# Patient Record
Sex: Female | Born: 1940 | Race: White | Hispanic: No | Smoking: Former smoker
Health system: Southern US, Community
[De-identification: ages and names within clinical notes are randomized; demographics above are authoritative.]

## PROBLEM LIST (undated history)

## (undated) DIAGNOSIS — M2550 Pain in unspecified joint: Secondary | ICD-10-CM

## (undated) DIAGNOSIS — M5030 Other cervical disc degeneration, unspecified cervical region: Secondary | ICD-10-CM

## (undated) DIAGNOSIS — Z973 Presence of spectacles and contact lenses: Secondary | ICD-10-CM

## (undated) DIAGNOSIS — M791 Myalgia, unspecified site: Secondary | ICD-10-CM

## (undated) DIAGNOSIS — M479 Spondylosis, unspecified: Secondary | ICD-10-CM

## (undated) DIAGNOSIS — M51369 Other intervertebral disc degeneration, lumbar region without mention of lumbar back pain or lower extremity pain: Secondary | ICD-10-CM

## (undated) DIAGNOSIS — G43909 Migraine, unspecified, not intractable, without status migrainosus: Secondary | ICD-10-CM

## (undated) DIAGNOSIS — F1120 Opioid dependence, uncomplicated: Secondary | ICD-10-CM

## (undated) DIAGNOSIS — M1611 Unilateral primary osteoarthritis, right hip: Secondary | ICD-10-CM

## (undated) DIAGNOSIS — K08109 Complete loss of teeth, unspecified cause, unspecified class: Secondary | ICD-10-CM

## (undated) DIAGNOSIS — Z8719 Personal history of other diseases of the digestive system: Secondary | ICD-10-CM

## (undated) DIAGNOSIS — I8390 Asymptomatic varicose veins of unspecified lower extremity: Secondary | ICD-10-CM

## (undated) DIAGNOSIS — F329 Major depressive disorder, single episode, unspecified: Secondary | ICD-10-CM

## (undated) DIAGNOSIS — M75101 Unspecified rotator cuff tear or rupture of right shoulder, not specified as traumatic: Secondary | ICD-10-CM

## (undated) DIAGNOSIS — I10 Essential (primary) hypertension: Secondary | ICD-10-CM

## (undated) DIAGNOSIS — M1990 Unspecified osteoarthritis, unspecified site: Secondary | ICD-10-CM

## (undated) DIAGNOSIS — G5600 Carpal tunnel syndrome, unspecified upper limb: Secondary | ICD-10-CM

## (undated) DIAGNOSIS — K219 Gastro-esophageal reflux disease without esophagitis: Secondary | ICD-10-CM

## (undated) DIAGNOSIS — F419 Anxiety disorder, unspecified: Secondary | ICD-10-CM

## (undated) DIAGNOSIS — M7918 Myalgia, other site: Secondary | ICD-10-CM

## (undated) DIAGNOSIS — F32A Depression, unspecified: Secondary | ICD-10-CM

## (undated) DIAGNOSIS — Z972 Presence of dental prosthetic device (complete) (partial): Secondary | ICD-10-CM

## (undated) DIAGNOSIS — M5136 Other intervertebral disc degeneration, lumbar region: Secondary | ICD-10-CM

## (undated) HISTORY — DX: Unspecified rotator cuff tear or rupture of right shoulder, not specified as traumatic: M75.101

## (undated) HISTORY — DX: Spondylosis, unspecified: M47.9

## (undated) HISTORY — DX: Carpal tunnel syndrome, unspecified upper limb: G56.00

## (undated) HISTORY — DX: Other cervical disc degeneration, unspecified cervical region: M50.30

## (undated) HISTORY — DX: Asymptomatic varicose veins of unspecified lower extremity: I83.90

## (undated) HISTORY — DX: Pain in unspecified joint: M25.50

## (undated) HISTORY — DX: Other intervertebral disc degeneration, lumbar region: M51.36

## (undated) HISTORY — PX: SPINAL CORD STIMULATOR INSERTION: SHX5378

## (undated) HISTORY — DX: Myalgia, unspecified site: M79.10

## (undated) HISTORY — PX: APPENDECTOMY: SHX54

## (undated) HISTORY — PX: CARPAL TUNNEL RELEASE: SHX101

## (undated) HISTORY — DX: Unspecified osteoarthritis, unspecified site: M19.90

## (undated) HISTORY — PX: MULTIPLE TOOTH EXTRACTIONS: SHX2053

## (undated) HISTORY — PX: DILATION AND CURETTAGE OF UTERUS: SHX78

## (undated) HISTORY — DX: Anxiety disorder, unspecified: F41.9

## (undated) HISTORY — DX: Myalgia, other site: M79.18

## (undated) HISTORY — PX: OTHER SURGICAL HISTORY: SHX169

## (undated) HISTORY — DX: Other intervertebral disc degeneration, lumbar region without mention of lumbar back pain or lower extremity pain: M51.369

## (undated) HISTORY — DX: Major depressive disorder, single episode, unspecified: F32.9

## (undated) HISTORY — DX: Migraine, unspecified, not intractable, without status migrainosus: G43.909

## (undated) HISTORY — PX: TONSILLECTOMY: SUR1361

## (undated) HISTORY — PX: FRACTURE SURGERY: SHX138

## (undated) HISTORY — DX: Gastro-esophageal reflux disease without esophagitis: K21.9

## (undated) HISTORY — PX: COLONOSCOPY WITH ESOPHAGOGASTRODUODENOSCOPY (EGD): SHX5779

## (undated) HISTORY — DX: Depression, unspecified: F32.A

---

## 1986-09-06 HISTORY — PX: ABDOMINAL HYSTERECTOMY: SHX81

## 2012-10-25 ENCOUNTER — Ambulatory Visit (HOSPITAL_COMMUNITY): Payer: Self-pay | Admitting: Psychiatry

## 2012-11-15 ENCOUNTER — Ambulatory Visit (INDEPENDENT_AMBULATORY_CARE_PROVIDER_SITE_OTHER): Payer: TRICARE For Life (TFL) | Admitting: Psychiatry

## 2012-11-15 DIAGNOSIS — F332 Major depressive disorder, recurrent severe without psychotic features: Secondary | ICD-10-CM | POA: Insufficient documentation

## 2012-11-15 MED ORDER — CLONAZEPAM 0.5 MG PO TABS: 0.5000 mg | ORAL_TABLET | Freq: Two times a day (BID) | ORAL | Status: DC | PRN

## 2012-11-15 MED ORDER — DULOXETINE HCL 30 MG PO CPEP: 30.0000 mg | ORAL_CAPSULE | Freq: Every day | ORAL | Status: DC

## 2012-11-15 NOTE — Progress Notes (Signed)
Psychiatric Assessment Adult  Patient Identification:  Anne Terrell Date of Evaluation:  11/15/2012 Chief Complaint:I can not stand it History of Chief Complaint:  No chief complaint on file. this patient is a 72 year old white married female who me from [place] to [place] in August of this year with her husband. Unfortunately her husband has been quite ill. He's been diagnosed and treated for lymphoma but now is cancer free. He received intense chemotherapy and radiation therapy and apparently it is injured them in such a way where he is actually bed ridden. She reports that she weighs on him hand and foot. She's been doing this for a number of years.this marriage is this patient's fourth marriage. Unfortunately she does not have any children to help her. She says she feels persistently daily depressed for years mainly related to the extensive care that she is giving her husband. She's been crying more. The patient herself has some chronic back pain. Patient says that she is sleeping well and eating fairly well. She says she's not lost any weight but feels somewhat a reduction in her appetite. Her energy level she says is low. She is thinking and concentrating well, denies worthlessness at this time denies suicidal ideation. She is still able to enjoy reading TV and her 2 dogs. The patient denies being suspicious and is not somatic. She denies the use of alcohol ever. The patient denies psychotic symptomatology. Patient denies symptoms consistent with mania or of any specific anxiety disorder. The patient has been on Klonopin 0.5 mg twice a day and has been taking Zoloft 100 mg 2 a day for years. She also takes trazodone 50 mg at night. The patient has had 2 psychiatric hospitalizations the last one in 2010 when she was depressed. Noted is that she's not seen a psychiatrist as an outpatient and has never been in psychotherapy.  HPI Review of Systems Physical Exam  Depressive Symptoms: depressed  mood,  (Hypo) Manic Symptoms:   Elevated Mood:  No Irritable Mood:  No Grandiosity:  No Distractibility:  No Labiality of Mood:  No Delusions:  No Hallucinations:  No Impulsivity:  No Sexually Inappropriate Behavior:  No Financial Extravagance:  No Flight of Ideas:  No  Anxiety Symptoms: Excessive Worry:  No Panic Symptoms:  No Agoraphobia:  No Obsessive Compulsive: No  Symptoms: None, Specific Phobias:  No Social Anxiety:  No  Psychotic Symptoms:  Hallucinations: No None Delusions:  No Paranoia:  No   Ideas of Reference:  No  PTSD Symptoms: Ever had a traumatic exposure:  No Had a traumatic exposure in the last month:  No Re-experiencing: No None Hypervigilance:  No Hyperarousal: No None Avoidance: No None  Traumatic Brain Injury: No   Past Psychiatric History: Diagnosis: Major Depression  Hospitalizations: 2 x  Outpatient Care: none  Substance Abuse Care:   Self-Mutilation:   Suicidal Attempts: 1x  Violent Behaviors:    Past Medical History:  No past medical history on file. History of Loss of Consciousness:   Seizure History:   Cardiac History:   Allergies:  Allergies not on file Current Medications:  No current outpatient prescriptions on file.   No current facility-administered medications for this visit.    Previous Psychotropic Medications:  Medication Dose   Zoloft 100  2qam  Klonopine .5 BID                       Substance Abuse History in the last 12 months: Substance Age of 1st  Use Last Use Amount Specific Type   Medical Consequences of Substance Abuse:   Legal Consequences of Substance Abuse:   Family Consequences of Substance Abuse:   Blackouts:   DT's:   Withdrawal Symptoms:   None  Social History: Current Place of Residence: [place] [employer] of Birth:  Family Members:  Marital Status:  Married Children: none  Sons:   Daughters:  Relationships:  Education:   Educational Problems/Performance:  Religious  Beliefs/Practices:  History of Abuse: none Teacher, music History:  None. Legal History:  Hobbies/Interests:   Family History:  No family history on file.  Mental Status Examination/Evaluation: Objective:  Appearance: Casual  Eye Contact::  Fair  Speech:  NA  Volume:  Normal  Mood:  sad  Affect:  Congruent  Thought Process:  Coherent  Orientation:  Full (Time, Place, and Person)  Thought Content:  WDL  Suicidal Thoughts:  No  Homicidal Thoughts:  No  Judgement:  Good  Insight:  Good  Psychomotor Activity:  Normal  Akathisia:  No  Handed:  Right  AIMS (if indicated):    Assets:  Communication Skills    Laboratory/X-Ray Psychological Evaluation(s)        Assessment:  Axis I: Major Depression, Recurrent severe  AXIS I Major Depression, Recurrent severe  AXIS II Deferred  AXIS III No past medical history on file.   AXIS IV problems related to social environment  AXIS V 51-60 moderate symptoms   Treatment Plan/Recommendations:  Plan of Care: at this time the patient she'll taper off her Zoloft by taking 100 mg 4 days and then taking 50 mg for 4 dayswe shall go ahead and begin her on Cymbalta and titrate her up to 60 mg. For now the patient will continue taking Klonopin 0.5 mg twice a day and trazodone 50 mg at night. Today the patient received the telephone number for senior resources center [facility] to help her with some assistance in caring for her debilitated bed ridden husband. On her next visit in 7 weeks she she'll to discuss the possibility of psychotherapy  Laboratory:    Psychotherapy:   Medications:   Routine PRN Medications:    Consultations:   Safety Concerns:    Other:      Lucas Mallow, MD 3/12/20142:50 PM

## 2012-12-12 ENCOUNTER — Other Ambulatory Visit: Payer: Self-pay | Admitting: Family Medicine

## 2012-12-12 DIAGNOSIS — N644 Mastodynia: Secondary | ICD-10-CM

## 2012-12-12 DIAGNOSIS — N63 Unspecified lump in unspecified breast: Secondary | ICD-10-CM

## 2013-01-02 ENCOUNTER — Ambulatory Visit
Admission: RE | Admit: 2013-01-02 | Discharge: 2013-01-02 | Disposition: A | Discharge status: Home / Self Care | Payer: TRICARE For Life (TFL) | Source: Ambulatory Visit | Attending: Family Medicine | Admitting: Family Medicine

## 2013-01-02 DIAGNOSIS — N644 Mastodynia: Secondary | ICD-10-CM

## 2013-01-02 DIAGNOSIS — N63 Unspecified lump in unspecified breast: Secondary | ICD-10-CM

## 2013-01-05 ENCOUNTER — Ambulatory Visit (INDEPENDENT_AMBULATORY_CARE_PROVIDER_SITE_OTHER): Payer: Medicare Other | Admitting: Psychiatry

## 2013-01-05 DIAGNOSIS — F321 Major depressive disorder, single episode, moderate: Secondary | ICD-10-CM | POA: Insufficient documentation

## 2013-01-05 DIAGNOSIS — F332 Major depressive disorder, recurrent severe without psychotic features: Secondary | ICD-10-CM

## 2013-01-05 MED ORDER — VENLAFAXINE HCL ER 75 MG PO CP24: 150.0000 mg | ORAL_CAPSULE | Freq: Every day | ORAL | Status: DC

## 2013-01-05 MED ORDER — CLONAZEPAM 0.5 MG PO TABS: 0.5000 mg | ORAL_TABLET | Freq: Two times a day (BID) | ORAL | Status: DC | PRN

## 2013-01-05 NOTE — Progress Notes (Signed)
[facility] MD Progress Note  01/05/2013 11:56 AM Anne Terrell  MRN:  [id number] Subjective: got lost Diagnosis:  Axis I: Major Depression, Recurrent severe Today the patient came 10 minutes late. She comes alone to unfortunately the patient could not get her Cymbalta because it cost too much. Therefore she did not change the Zoloft. Overall her mood is no different. She's not worse but not better. She apparently is going to have a pain distributor putting her back and she needs a psychiatrist to talk to me. This I take one half in the future. Nonetheless because the Cymbalta was too expensive I will change her to taking Effexor instead. She'll continue taking Klonopin 0.5 mg twice a day. The patient claims that she sleeps at ADL's:  Intact  Sleep: Good  Appetite:  Good  Suicidal Ideation:  no Homicidal Ideation:  no AEB (as evidenced by):  Psychiatric Specialty Exam: ROS  There were no vitals taken for this visit.There is no height or weight on file to calculate BMI.  General Appearance: Casual  Eye Contact::  Good  Speech:  Clear and Coherent  Volume:  Normal  Mood:  Depressed  Affect:  Flat  Thought Process:  Linear  Orientation:  Full (Time, Place, and Person)  Thought Content:  WDL  Suicidal Thoughts:  No  Homicidal Thoughts:  No  Memory:  nl  Judgement:  Good  Insight:  Good  Psychomotor Activity:  Decreased  Concentration:  Good  Recall:  Good  Akathisia:  No  Handed:  Right  AIMS (if indicated):     Assets:  Desire for Improvement  Sleep:      Current Medications: Current Outpatient Prescriptions  Medication Sig Dispense Refill  . clonazePAM (KLONOPIN) 0.5 MG tablet Take 1 tablet (0.5 mg total) by mouth 2 (two) times daily as needed for anxiety. 1 BID  60 tablet  3  . DULoxetine (CYMBALTA) 30 MG capsule Take 1 capsule (30 mg total) by mouth daily. 1 qam for  1week the 2 qam  60 capsule  2  . venlafaxine XR (EFFEXOR-XR) 75 MG 24 hr capsule Take 2 capsules (150 mg  total) by mouth daily.  60 capsule  3   No current facility-administered medications for this visit.    Lab Results: No results found for this or any previous visit (from the past 48 hour(s)).  Physical Findings: AIMS:  , ,  ,  ,    CIWA:    COWS:     Treatment Plan Summary: At this time the patient will begin on Effexor 75 mg one in the morning for one week and then double. The patient will titrate off her Zoloft over the next week and a half. The patient will continue taking Klonopin 0.5 mg twice a day. Once again the patient was given the telephone number to senior resources are [facility] to get assistance both in therapy and possibly for any services that we delivered in her home. This patient to return to see me in 7 weeks.  Plan:  Medical Decision Making Problem Points:  Established problem, worsening (2) Data Points:  Review of new medications or change in dosage (2)  I certify that inpatient services furnished can reasonably be expected to improve the patient's condition.   Dyquan Minks IRVING 01/05/2013, 11:56 AM

## 2013-02-02 ENCOUNTER — Ambulatory Visit (HOSPITAL_COMMUNITY): Payer: TRICARE For Life (TFL) | Admitting: Psychiatry

## 2013-03-07 ENCOUNTER — Ambulatory Visit (HOSPITAL_COMMUNITY): Payer: TRICARE For Life (TFL) | Admitting: Psychiatry

## 2013-03-08 ENCOUNTER — Other Ambulatory Visit: Payer: Self-pay | Admitting: Pain Medicine

## 2013-03-08 DIAGNOSIS — M25511 Pain in right shoulder: Secondary | ICD-10-CM

## 2013-03-14 ENCOUNTER — Ambulatory Visit
Admission: RE | Admit: 2013-03-14 | Discharge: 2013-03-14 | Disposition: A | Discharge status: Home / Self Care | Payer: Medicare Other | Source: Ambulatory Visit | Attending: Pain Medicine | Admitting: Pain Medicine

## 2013-03-14 DIAGNOSIS — M25511 Pain in right shoulder: Secondary | ICD-10-CM

## 2013-04-11 ENCOUNTER — Ambulatory Visit (INDEPENDENT_AMBULATORY_CARE_PROVIDER_SITE_OTHER): Payer: Medicare Other | Admitting: Psychiatry

## 2013-04-11 DIAGNOSIS — F332 Major depressive disorder, recurrent severe without psychotic features: Secondary | ICD-10-CM

## 2013-04-11 DIAGNOSIS — F339 Major depressive disorder, recurrent, unspecified: Secondary | ICD-10-CM

## 2013-04-11 DIAGNOSIS — F411 Generalized anxiety disorder: Secondary | ICD-10-CM

## 2013-04-11 MED ORDER — VENLAFAXINE HCL ER 75 MG PO CP24: 150.0000 mg | ORAL_CAPSULE | Freq: Every day | ORAL | Status: DC

## 2013-04-11 MED ORDER — CLONAZEPAM 0.5 MG PO TABS: 0.5000 mg | ORAL_TABLET | Freq: Two times a day (BID) | ORAL | Status: DC | PRN

## 2013-04-11 NOTE — Progress Notes (Signed)
[facility] MD Progress Note  04/11/2013 3:27 PM Anne Terrell  MRN:  [id number] Subjective:  stronger Diagnosis:  Axis I: Generalized Anxiety Disorder and Major Depression, Recurrent severe Today the patient says she is much improved. She says the Effexor is very helpful. She said her mood is better and she is crying much much less. She feels stronger and more energetic. The patient denies any problem sleeping. She denies any role weight change but notes her appetite is less. She denies being suicidal. She denies problems thinking and concentrating. She denies a sense of worthlessness. She drinks no alcohol. She denies any suicidal ideation at this time. The patient is still able to read watch TV and enjoy things. His care of her husband who continues to slowly decline. The less the patient is doing much better. Today reviewed the pros and cons of her medications and she agreed to take them as prescribed. Today the patient denied any physical complaints. She denied any neurological symptoms at all. Patient appears optimistic and positive. Patient denies any symptoms of anxiety. ADL's:  Intact  Sleep: Good  Appetite:  Good  Suicidal Ideation:  no Homicidal Ideation:  no AEB (as evidenced by):  Psychiatric Specialty Exam: ROS  There were no vitals taken for this visit.There is no height or weight on file to calculate BMI.  General Appearance: Casual  Eye Contact::  Good  Speech:  Clear and Coherent  Volume:  Normal  Mood:  Euthymic  Affect:  Congruent  Thought Process:  Goal Directed  Orientation:  NA  Thought Content:  WDL  Suicidal Thoughts:  No  Homicidal Thoughts:  No  Memory:    Judgement:  Good  Insight:  Good  Psychomotor Activity:  Normal  Concentration:  Good  Recall:  Good  Akathisia:  No  Handed:  Right  AIMS (if indicated):     Assets:  Desire for Improvement  Sleep:      Current Medications: Current Outpatient Prescriptions  Medication Sig Dispense Refill  .  clonazePAM (KLONOPIN) 0.5 MG tablet Take 1 tablet (0.5 mg total) by mouth 2 (two) times daily as needed for anxiety. 1 BID  60 tablet  3  . venlafaxine XR (EFFEXOR-XR) 75 MG 24 hr capsule Take 2 capsules (150 mg total) by mouth daily.  60 capsule  5   No current facility-administered medications for this visit.    Lab Results: No results found for this or any previous visit (from the past 48 hour(s)).  Physical Findings: AIMS:  , ,  ,  ,    CIWA:    COWS:     Treatment Plan Summary: At this time the patient will continue taking Effexor 150 mg in the morning and Klonopin 0.5 mg twice a day. The patient has made no contact with senior resources in [place] but doesn't seem to need them. The patient seems much improved. This patient return to see me in 3 months.  Plan:  Medical Decision Making Problem Points:  Established problem, stable/improving (1) Data Points:  Review of medication regiment & side effects (2)  I certify that inpatient services furnished can reasonably be expected to improve the patient's condition.   Cleavon Goldman IRVING 04/11/2013, 3:27 PM

## 2013-05-30 ENCOUNTER — Telehealth (HOSPITAL_COMMUNITY): Payer: Self-pay | Admitting: *Deleted

## 2013-05-30 NOTE — Telephone Encounter (Signed)
Patient came to office. Crying. States husband has terminal liver disease with 3-5 months to live.States he told her this morning he will die this week. When she went to pharmacy this morning to pick up medicine for husband, she decided to come to Dr.Plovsky's office to see if there was anything that could be done to help her with her nerves. In Dr. Caprice Renshaw absence, pt record reviewed by Dr. Lolly Mustache. Dr. Lolly Mustache increased Klonopin 0.5 mg from two times daily to three times daily. Dr. Lolly Mustache asked that Dr.Plovsky be notified on his return in change in patient medication.

## 2013-07-18 ENCOUNTER — Encounter (INDEPENDENT_AMBULATORY_CARE_PROVIDER_SITE_OTHER): Payer: Self-pay

## 2013-07-18 ENCOUNTER — Ambulatory Visit (INDEPENDENT_AMBULATORY_CARE_PROVIDER_SITE_OTHER): Payer: Medicare Other | Admitting: Psychiatry

## 2013-07-18 DIAGNOSIS — F332 Major depressive disorder, recurrent severe without psychotic features: Secondary | ICD-10-CM

## 2013-07-18 MED ORDER — VENLAFAXINE HCL ER 75 MG PO CP24: 150.0000 mg | ORAL_CAPSULE | Freq: Every day | ORAL | Status: DC

## 2013-07-18 MED ORDER — CLONAZEPAM 0.5 MG PO TABS: 0.5000 mg | ORAL_TABLET | Freq: Two times a day (BID) | ORAL | Status: DC | PRN

## 2013-07-18 NOTE — Progress Notes (Signed)
[facility] MD Progress Note  07/18/2013 2:06 PM Anne Terrell  MRN:  [id number] Subjective: He died Today the patient started off by sharing that her husband got a second malignancy and died 3 weeks ago. The patient went through much of the session. The patient says that she's doing better. The first week or 2 was very difficult. Fortunately she has her mother-in-law and brother-in-law in the city to be with her. The funeral was a Designer, television/film set, Product/process development scientist. She doesn't knowledge that is better that her husband is no longer suffering and that in some ways even she feels relieved. She denies persistent depression at this time. She is actually sleeping better now. Her husband would typically wait her out throughout the night. The patient is eating fair. She has good energy. She can still concentrate. She watches TV and she read some. She denies the use of alcohol. The patient is actually looking forward to having a pain stimulator put in her back. She does suffer from mild to moderate chronic back pain. The patient is trying to stay active. Generally she is a good attitude. Today we discussed the process of grief and anxiety of waves of grief. The patient seems connected and intact. She's not suicidal. Today reviewed the pros and cons of her medication and she agreed to continue taking them. She takes them just as prescribed. Today patient denies any physical complaints. She denied any neurological symptoms. Diagnosis:   DSM5: Schizophrenia Disorders:   Obsessive-Compulsive Disorders:   Trauma-Stressor Disorders:   Substance/Addictive Disorders:   Depressive Disorders:  Major Depressive Disorder - Mild (296.21)  Axis I: Major Depression, Recurrent severe  ADL's:  Intact  Sleep: Good  Appetite:  Good  Suicidal Ideation:  no Homicidal Ideation:  none AEB (as evidenced by):  Psychiatric Specialty Exam: ROS  There were no vitals taken for this visit.There is no height or weight on file to  calculate BMI.  General Appearance:   Eye Contact::  Good  Speech:  Clear and Coherent  Volume:  Normal  Mood:  Depressed  Affect:  Appropriate  Thought Process:  Coherent  Orientation:  Full (Time, Place, and Person)  Thought Content:  WDL  Suicidal Thoughts:  No  Homicidal Thoughts:  No  Memory:  nl  Judgement:  Good  Insight:  Good  Psychomotor Activity:  Normal  Concentration:  Good  Recall:  Good  Akathisia:  No  Handed:  Right  AIMS (if indicated):     Assets:  Communication Skills  Sleep:      Current Medications: Current Outpatient Prescriptions  Medication Sig Dispense Refill  . clonazePAM (KLONOPIN) 0.5 MG tablet Take 1 tablet (0.5 mg total) by mouth 2 (two) times daily as needed for anxiety. 1 BID  60 tablet  3  . venlafaxine XR (EFFEXOR-XR) 75 MG 24 hr capsule Take 2 capsules (150 mg total) by mouth daily.  60 capsule  5   No current facility-administered medications for this visit.    Lab Results: No results found for this or any previous visit (from the past 48 hour(s)).  Physical Findings: AIMS:  , ,  ,  ,    CIWA:    COWS:     Treatment Plan Summary:   Plan: At this time the patient will continue taking Klonopin 0.5 mg twice a day. She'll continue taking Effexor 150 mg 2 in the morning. This patient seems stable to me. She's agreed to return to see me in 3 months.  Medical  Decision Making Problem Points:  Established problem, stable/improving (1) Data Points:  Review of medication regiment & side effects (2)  I certify that inpatient services furnished can reasonably be expected to improve the patient's condition.   Anne Terrell IRVING 07/18/2013, 2:06 PM

## 2013-10-17 ENCOUNTER — Ambulatory Visit (INDEPENDENT_AMBULATORY_CARE_PROVIDER_SITE_OTHER): Payer: Medicare Other | Admitting: Psychiatry

## 2013-10-17 VITALS — BP 137/68 | HR 92 | Ht 66.0 in | Wt 170.4 lb

## 2013-10-17 DIAGNOSIS — F332 Major depressive disorder, recurrent severe without psychotic features: Secondary | ICD-10-CM

## 2013-10-17 MED ORDER — CLONAZEPAM 0.5 MG PO TABS: 0.5000 mg | ORAL_TABLET | Freq: Two times a day (BID) | ORAL | Status: DC | PRN

## 2013-10-17 MED ORDER — VENLAFAXINE HCL ER 75 MG PO CP24: 150.0000 mg | ORAL_CAPSULE | Freq: Every day | ORAL | Status: DC

## 2013-10-17 NOTE — Progress Notes (Signed)
[facility] MD Progress Note  10/17/2013 1:43 PM Anne Terrell  MRN:  [id number] Subjective:  Feeling well Today the patient is seen one time. The patient continues to do very well. She denies depression. Her mood is clearly improved. She's gone through a significant grieving process and feels better. She is sleeping and eating well. She has good energy. The patient is to be moving into her mother loss home. It is noted that the patient's husband died 11/17/2024and then 3 months later his mother died. The patient is inheriting one third of his mother's in [employer]. The patient is very active. She has 2 dogs that she walks all the time. She is reading well. She reads self-help books about grieving. The patient is active in her church. She just recently taken over by bowel and tearing at the church is [employer]. The patient is thinking and concentrating well. She is a good sense of worth. It is noted the patient is on disability for her back. Over the time I've known her we've made efforts to get approval for her to have a device for pain. In December 15 she had a device placed and is very helpful to reduce her back pain. The patient takes her medicines as prescribed. She is very creative. She makes jewelry and sews. She spiritually active. This patient denies being suicidal. She's taking her medications as prescribed. Interestingly hospice has always been sending a visiting therapist to her home. Her next session is tomorrow. Patient is recovering very well from the death of her husband. She had some issues getting her driving license but that has been handled.  Diagnosis:   DSM5: Schizophrenia Disorders:   Obsessive-Compulsive Disorders:   Trauma-Stressor Disorders:   Substance/Addictive Disorders:   Depressive Disorders:  Major Depressive Disorder - Mild (296.21) Total Time spent with patient: 45 minutes  Axis I: Major Depression, Recurrent severe  ADL's:  Intact  Sleep: Good  Appetite:   Good  Suicidal Ideation:  no Homicidal Ideation:  none AEB (as evidenced by):  Psychiatric Specialty Exam: Physical Exam  ROS  Blood pressure 137/68, pulse 92, height 5\' 6"  (1.676 m), weight 170 lb 6.4 oz (77.293 kg).Body mass index is 27.52 kg/(m^2).  General Appearance: Fairly Groomed  Patent attorney::  Good  Speech:  Clear and Coherent  Volume:  Normal  Mood:  Euthymic  Affect:  Appropriate  Thought Process:  Coherent  Orientation:  Full (Time, Place, and Person)  Thought Content:  WDL  Suicidal Thoughts:  No  Homicidal Thoughts:  No  Memory:  NA  Judgement:  Good  Insight:  Good  Psychomotor Activity:  Normal  Concentration:  Good  Recall:  Good  Fund of Knowledge:Good  Language: Good  Akathisia:  No  Handed:  Right  AIMS (if indicated):     Assets:  Communication Skills  Sleep:      Musculoskeletal: Strength & Muscle Tone:  Gait & Station:  Patient leans:   Current Medications: Current Outpatient Prescriptions  Medication Sig Dispense Refill  . clonazePAM (KLONOPIN) 0.5 MG tablet Take 1 tablet (0.5 mg total) by mouth 2 (two) times daily as needed for anxiety. 1 BID  60 tablet  5  . venlafaxine XR (EFFEXOR-XR) 75 MG 24 hr capsule Take 2 capsules (150 mg total) by mouth daily.  60 capsule  5   No current facility-administered medications for this visit.    Lab Results: No results found for this or any previous visit (from the past 48  hour(s)).  Physical Findings: AIMS:  , ,  ,  ,    CIWA:    COWS:     Treatment Plan Summary: At this time it was clarified that the patient is taking Effexor 75 mg 2 in the morning. The patient is also taking Klonopin 0.5 twice a day. The patient's mood is dramatically improved. She's not anxious. She is not oversedated. The patient is very active in her treatment plan. Noted is the patient does have a therapist who actually comes from hospice and this is her at her home. This patient to return to see me in 4  months.  Plan:  Medical Decision Making Problem Points:  Established problem, stable/improving (1) Data Points:  Review of medication regiment & side effects (2)  I certify that inpatient services furnished can reasonably be expected to improve the patient's condition.   Anne Terrell 10/17/2013, 1:43 PM

## 2014-02-20 ENCOUNTER — Ambulatory Visit (HOSPITAL_COMMUNITY): Payer: Medicare Other | Admitting: Psychiatry

## 2014-03-04 ENCOUNTER — Other Ambulatory Visit: Payer: Self-pay | Admitting: Family Medicine

## 2014-03-04 DIAGNOSIS — Z1231 Encounter for screening mammogram for malignant neoplasm of breast: Secondary | ICD-10-CM

## 2014-03-21 ENCOUNTER — Other Ambulatory Visit: Payer: Self-pay | Admitting: *Deleted

## 2014-03-21 DIAGNOSIS — I83893 Varicose veins of bilateral lower extremities with other complications: Secondary | ICD-10-CM

## 2014-03-26 ENCOUNTER — Ambulatory Visit: Payer: Medicare Other

## 2014-04-05 ENCOUNTER — Encounter (INDEPENDENT_AMBULATORY_CARE_PROVIDER_SITE_OTHER): Payer: Self-pay

## 2014-04-05 ENCOUNTER — Ambulatory Visit
Admission: RE | Admit: 2014-04-05 | Discharge: 2014-04-05 | Disposition: A | Discharge status: Home / Self Care | Payer: Medicare Other | Source: Ambulatory Visit | Attending: Family Medicine | Admitting: Family Medicine

## 2014-04-05 DIAGNOSIS — Z1231 Encounter for screening mammogram for malignant neoplasm of breast: Secondary | ICD-10-CM

## 2014-04-17 ENCOUNTER — Other Ambulatory Visit (HOSPITAL_COMMUNITY): Payer: Self-pay | Admitting: Psychiatry

## 2014-04-29 ENCOUNTER — Other Ambulatory Visit (HOSPITAL_COMMUNITY): Payer: Self-pay | Admitting: Psychiatry

## 2014-05-02 ENCOUNTER — Encounter: Payer: Self-pay | Admitting: Vascular Surgery

## 2014-05-02 ENCOUNTER — Other Ambulatory Visit (HOSPITAL_COMMUNITY): Payer: Self-pay | Admitting: *Deleted

## 2014-05-02 MED ORDER — CLONAZEPAM 0.5 MG PO TABS: 0.5000 mg | ORAL_TABLET | Freq: Two times a day (BID) | ORAL | Status: DC | PRN

## 2014-05-02 NOTE — Telephone Encounter (Signed)
Per MD order: May reorder 30 day supply of clonazepam with request for pharmacy to flag RX_Must make appt to receive further refills

## 2014-05-03 ENCOUNTER — Encounter: Payer: Self-pay | Admitting: Vascular Surgery

## 2014-05-03 ENCOUNTER — Ambulatory Visit (HOSPITAL_COMMUNITY)
Admission: RE | Admit: 2014-05-03 | Discharge: 2014-05-03 | Disposition: A | Discharge status: Home / Self Care | Payer: Medicare Other | Source: Ambulatory Visit | Attending: Vascular Surgery | Admitting: Vascular Surgery

## 2014-05-03 ENCOUNTER — Ambulatory Visit (INDEPENDENT_AMBULATORY_CARE_PROVIDER_SITE_OTHER): Payer: Medicare Other | Admitting: Vascular Surgery

## 2014-05-03 VITALS — BP 108/68 | HR 77 | Resp 16 | Ht 63.0 in | Wt 158.0 lb

## 2014-05-03 DIAGNOSIS — I872 Venous insufficiency (chronic) (peripheral): Secondary | ICD-10-CM | POA: Insufficient documentation

## 2014-05-03 DIAGNOSIS — I83893 Varicose veins of bilateral lower extremities with other complications: Secondary | ICD-10-CM | POA: Diagnosis present
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
## 2014-06-14 ENCOUNTER — Ambulatory Visit (HOSPITAL_COMMUNITY): Payer: Medicare Other | Admitting: Psychiatry

## 2014-06-14 ENCOUNTER — Ambulatory Visit (INDEPENDENT_AMBULATORY_CARE_PROVIDER_SITE_OTHER): Payer: Medicare Other | Admitting: Psychiatry

## 2014-06-14 VITALS — BP 125/70 | HR 98 | Ht 63.0 in | Wt 154.6 lb

## 2014-06-14 DIAGNOSIS — F332 Major depressive disorder, recurrent severe without psychotic features: Secondary | ICD-10-CM

## 2014-06-14 MED ORDER — VENLAFAXINE HCL ER 75 MG PO CP24: ORAL_CAPSULE | ORAL | Status: DC

## 2014-06-14 NOTE — Progress Notes (Signed)
[facility]  06/14/2014 12:09 PM Anne Terrell  MRN:  [id number] Subjective: Crying and sad Today the patient now just that she's having persistent worsening depression over the last few months. This is distinctly different than her previous visit. The patient cries on a daily basis. She is sleeping okay but her appetite is dramatically reduced. This is directly related to the deaths and the ambulance she feels about her husband. The patient is in a stable living environment. She does not drink. Her energy level is very poor. She can think and concentrate fairly well. The patient acknowledges feeling very worthless. At this time she acknowledges fleeting insignificant suicidal thoughts. It is noted that she made a very distant suicide attempt many many years ago. The patient would not act now and has no intent to commit suicide as religion stops her. The patient is wanting to get well and now is willing to enter into psychotherapy with hospice. Diagnosis:  DSM5: Schizophrenia Disorders:   Obsessive-Compulsive Disorders:   Trauma-Stressor Disorders:   Substance/Addictive Disorders:   Depressive Disorders:  Major Depressive Disorder - Moderate (296.22) Total Time spent with patien 15 min  Axis I: Major Depression, Recurrent severe  ADL's:  Intact  Sleep: Good  Appetite:  Poor  Suicidal Ideation:  no Homicidal Ideation:  none AEB (as evidenced by):  Psychiatric Specialty Exam: Physical Exam  ROS  Blood pressure 125/70, pulse 98, height 5\' 3"  (1.6 m), weight 154 lb 9.6 oz (70.126 kg).Body mass index is 27.39 kg/(m^2).  General Appearance: Casual  Eye Contact::  Good  Speech:  Clear and Coherent  Volume:  Normal  Mood:  Euthymic  Affect:  Appropriate  Thought Process:  Coherent  Orientation:  NA  Thought Content:  WDL  Suicidal Thoughts:  No  Homicidal Thoughts:  No  Memory:  NA  Judgement:  Good  Insight:  NA and Good  Psychomotor Activity:  Decreased   Concentration:  Good  Recall:  Fair  Fund of Knowledge:Good  Language: Good  Akathisia:  No  Handed:  Right  AIMS (if indicated):     Assets:  Desire for Improvement  Sleep:      Musculoskeletal: Strength & Muscle Tone:  Gait & Station:  Patient leans:   Current Medications: Current Outpatient Prescriptions  Medication Sig Dispense Refill  . clonazePAM (KLONOPIN) 0.5 MG tablet Take 1 tablet (0.5 mg total) by mouth 2 (two) times daily as needed for anxiety. 1 BID  60 tablet  1  . esomeprazole (NEXIUM) 40 MG capsule Take 40 mg by mouth.      . estradiol (ESTRACE) 1 MG tablet Take 1 mg by mouth.      . fentaNYL (DURAGESIC - DOSED MCG/HR) 100 MCG/HR Place 1 patch onto the skin.      . fentaNYL (FENTORA) 100 MCG buccal tablet Place 100 mcg inside cheek every 4 (four) hours as needed for breakthrough pain or severe pain.      . Oxycodone HCl 10 MG TABS Take 10 mg by mouth 3 (three) times daily.      Marland Kitchen. oxyCODONE-acetaminophen (PERCOCET) 10-325 MG per tablet Take 1 tablet by mouth.      Marland Kitchen. rOPINIRole (REQUIP) 0.5 MG tablet Take 0.5 mg by mouth.      . SUMAtriptan (IMITREX) 50 MG tablet Take 50 mg by mouth.      . traZODone (DESYREL) 50 MG tablet Take 50 mg by mouth at bedtime.      Marland Kitchen. venlafaxine XR (EFFEXOR-XR)  75 MG 24 hr capsule TAKE 3 CAPSULES BY MOUTH DAILY  90 capsule  5  . venlafaxine XR (EFFEXOR-XR) 75 MG 24 hr capsule TAKE 2 CAPSULES BY MOUTH DAILY  60 capsule  5  . venlafaxine XR (EFFEXOR-XR) 75 MG 24 hr capsule TAKE 3 CAPSULES BY MOUTH DAILY  90 capsule  5   No current facility-administered medications for this visit.    Lab Results: No results found for this or any previous visit (from the past 48 hour(s)).  Physical Findings: AIMS:  , ,  ,  ,    CIWA:    COWS:     Treatment Plan Summary: At this time the patient will be given the number to hospice in [place]. We'll also go ahead and increase her Effexor from 150 mg to 225 mg. This patient will call as if there  problems. I do not think this patient is dangerous to herself or others. She is not acutely suicidal. She's fleeting thoughts but no plan or intent to act. This patient was told to call if there any changes they were always available for her. Otherwise she'll be seen back in the office in one to 2 months.   Plan:  Medical Decision Making Problem Points:  Established problem, worsening (2) Data Points:  Review of new medications or change in dosage (2)  I certify that inpatient services furnished can reasonably be expected to improve the patient's condition.   Anne Terrell 06/14/2014, 12:09 PM

## 2014-06-26 ENCOUNTER — Other Ambulatory Visit (HOSPITAL_COMMUNITY): Payer: Self-pay | Admitting: *Deleted

## 2014-06-26 DIAGNOSIS — F332 Major depressive disorder, recurrent severe without psychotic features: Secondary | ICD-10-CM

## 2014-06-26 MED ORDER — VENLAFAXINE HCL ER 75 MG PO CP24: ORAL_CAPSULE | ORAL | Status: DC

## 2014-06-26 NOTE — Telephone Encounter (Addendum)
Came to office to request that RX for Venlafaxine HCL ER 75mg  be sent [facility].Brought printed RX from 06/14/14 with her.  Advised her RX can be sent electronically to [facility]. RX sent at this time. Notified [facility] that RX will be sent to [facility]. They are to discontinue RX sent to them on 06/14/14

## 2014-07-19 ENCOUNTER — Other Ambulatory Visit (HOSPITAL_COMMUNITY): Payer: Self-pay | Admitting: Psychiatry

## 2014-07-19 NOTE — Telephone Encounter (Signed)
Chart reviewed. Refill appropriate. 

## 2014-07-30 ENCOUNTER — Ambulatory Visit: Payer: Medicare Other | Admitting: Vascular Surgery

## 2014-08-06 ENCOUNTER — Ambulatory Visit: Payer: Medicare Other | Admitting: Vascular Surgery

## 2014-08-07 ENCOUNTER — Encounter: Payer: Self-pay | Admitting: Vascular Surgery

## 2014-08-07 ENCOUNTER — Ambulatory Visit (INDEPENDENT_AMBULATORY_CARE_PROVIDER_SITE_OTHER): Payer: Medicare Other | Admitting: Psychiatry

## 2014-08-07 VITALS — BP 124/66 | HR 82 | Ht 63.0 in | Wt 152.6 lb

## 2014-08-07 DIAGNOSIS — F331 Major depressive disorder, recurrent, moderate: Secondary | ICD-10-CM

## 2014-08-07 DIAGNOSIS — F332 Major depressive disorder, recurrent severe without psychotic features: Secondary | ICD-10-CM

## 2014-08-07 MED ORDER — CLONAZEPAM 0.5 MG PO TABS: ORAL_TABLET | ORAL | Status: DC

## 2014-08-07 MED ORDER — VENLAFAXINE HCL ER 75 MG PO CP24: ORAL_CAPSULE | ORAL | Status: DC

## 2014-08-07 MED ORDER — TRAZODONE HCL 50 MG PO TABS: 50.0000 mg | ORAL_TABLET | Freq: Every day | ORAL | Status: DC

## 2014-08-07 NOTE — Progress Notes (Signed)
[facility]  08/07/2014 4:39 PM Anne Terrell  MRN:  [id number] Subjective:  Better At this time the patient is much more active and she denies being depressed. She is meeting more people. She's going to church regularly. She joined the choir and she goes to [place] [place]. She's more engaged with the world around her. She never actually went to hospice therapy. The patient says she is recovering quite well. She has Christmas plans which is all set up. The patient says she sleeping and eating well. She's got a reasonably good amount of energy. She denies problems thinking or concentrating. She has a good sense of worth. The patient denies any suicidal thoughts at all. She is happily engaged and seems to be recovering fairly well. She believes the increase of Effexor is been very helpful and I suspect it has been. The patient takes a little trazodone to help her sleep. I think the patient continues to grieve but is doing better. This will be the first Christmas without her husband. Diagnosis:   DSM5: Schizophrenia Disorders:   Obsessive-Compulsive Disorders:   Trauma-Stressor Disorders:   Substance/Addictive Disorders:   Depressive Disorders:  Major Depressive Disorder - Mild (296.21) Total Time spent with patient: 30 minutes  Axis I: Major Depression, Recurrent severe  ADL's:  Intact  Sleep: Good  Appetite:  Good  Suicidal Ideation:  no Homicidal Ideation:  none AEB (as evidenced by):  Psychiatric Specialty Exam: Physical Exam  ROS  Blood pressure 124/66, pulse 82, height 5\' 3"  (1.6 m), weight 152 lb 9.6 oz (69.219 kg).Body mass index is 27.04 kg/(m^2).  General Appearance: Casual  Eye Contact::  Good  Speech:  Clear and Coherent  Volume:  Normal  Mood:nl  Affect:  Congruent  Thought Process:  Goal Directed  Orientation:  Full (Time, Place, and Person)  Thought Content:  WDL  Suicidal Thoughts:  No  Homicidal Thoughts:  No  Memory:  Negative  Judgement:  Negative   Insight:  Negative  Psychomotor Activity:  NA  Concentration:  NA  Recall:  NA  Fund of Knowledge:Good  Language: Good  Akathisia:  No  Handed:  Right  AIMS (if indicated):     Assets:  Communication Skills  Sleep:      Musculoskeletal: Strength & Muscle Tone:  Gait & Station:  Patient leans:   Current Medications: Current Outpatient Prescriptions  Medication Sig Dispense Refill  . clonazePAM (KLONOPIN) 0.5 MG tablet take 1 tablet by mouth twice a day if needed 60 tablet 0  . esomeprazole (NEXIUM) 40 MG capsule Take 40 mg by mouth.    . estradiol (ESTRACE) 1 MG tablet Take 1 mg by mouth.    . fentaNYL (DURAGESIC - DOSED MCG/HR) 100 MCG/HR Place 1 patch onto the skin.    . fentaNYL (FENTORA) 100 MCG buccal tablet Place 100 mcg inside cheek every 4 (four) hours as needed for breakthrough pain or severe pain.    . Oxycodone HCl 10 MG TABS Take 10 mg by mouth 3 (three) times daily.    Marland Kitchen. oxyCODONE-acetaminophen (PERCOCET) 10-325 MG per tablet Take 1 tablet by mouth.    Marland Kitchen. rOPINIRole (REQUIP) 0.5 MG tablet Take 0.5 mg by mouth.    . SUMAtriptan (IMITREX) 50 MG tablet Take 50 mg by mouth.    . traZODone (DESYREL) 50 MG tablet Take 1 tablet (50 mg total) by mouth at bedtime. 30 tablet 5  . venlafaxine XR (EFFEXOR-XR) 75 MG 24 hr capsule TAKE 3 CAPSULES BY  MOUTH DAILY 90 capsule 5   No current facility-administered medications for this visit.    Lab Results: No results found for this or any previous visit (from the past 48 hour(s)).  Physical Findings: AIMS:  , ,  ,  ,    CIWA:    COWS:     Treatment Plan Summary: At this time the patient will continue taking Effexor 225 mg. The patient remain active and return to see me in approximately 2-3 months. I believe the patient is doing great.  Plan:  Medical Decision Making Problem Points:  Established problem, stable/improving (1) Data Points:  Review of medication regiment & side effects (2)  I certify that inpatient services  furnished can reasonably be expected to improve the patient's condition.   Lucas MallowLOVSKY, Ankush Gintz IRVING 08/07/2014, 4:39 PM

## 2014-08-08 ENCOUNTER — Ambulatory Visit (INDEPENDENT_AMBULATORY_CARE_PROVIDER_SITE_OTHER): Payer: Medicare Other | Admitting: Vascular Surgery

## 2014-08-08 ENCOUNTER — Encounter: Payer: Self-pay | Admitting: Vascular Surgery

## 2014-08-08 VITALS — BP 122/82 | HR 91 | Resp 16 | Ht 63.0 in | Wt 150.0 lb

## 2014-08-08 DIAGNOSIS — I872 Venous insufficiency (chronic) (peripheral): Secondary | ICD-10-CM

## 2014-08-08 NOTE — Progress Notes (Signed)
Problems with Activities of Daily Living Secondary to Leg Pain  1. Mrs. Polsky states all activities that require prolonged standing (cooking, cleaning, shopping) are difficult for her due to leg pain.    2. Mrs. Stephen states that she is unable to exercise due to leg pain.     Failure of  Conservative Therapy:  1. Worn 20-30 mm Hg thigh high compression hose >3 months with no relief of symptoms.  2. Frequently elevates legs-no relief of symptoms  3. Taken Ibuprofen 600 Mg TID with no relief of symptoms.  Here today for continued discussion of her venous pathology  Again discussed the issue of her left leg venous hypertension. I did reimage her saphenous veins in her right and left leg. These are normal on the right leg she does have dilatation of her saphenous vein in the left leg. She did have documented reflux with a formal duplex. She continues to complain of pains that are not consistent with venous hypertension. She does have some varicosities the level of her ankle but reports no pain from this. I discussed this at length with patient. I did explain that she has venous hypertension related to her valvular incompetence but this is not causing urinary any symptoms. Explained that this may be progressive but is not dangerous and would not recommend any treatment unless she develops symptoms related to this. She is comfortable with this discussion will see us again on as-needed basis

## 2014-11-06 ENCOUNTER — Ambulatory Visit (INDEPENDENT_AMBULATORY_CARE_PROVIDER_SITE_OTHER): Payer: Medicare Other | Admitting: Psychiatry

## 2014-11-06 ENCOUNTER — Encounter (HOSPITAL_COMMUNITY): Payer: Self-pay | Admitting: Psychiatry

## 2014-11-06 VITALS — BP 127/69 | HR 88 | Ht 63.0 in | Wt 148.0 lb

## 2014-11-06 DIAGNOSIS — F331 Major depressive disorder, recurrent, moderate: Secondary | ICD-10-CM | POA: Diagnosis not present

## 2014-11-06 DIAGNOSIS — F332 Major depressive disorder, recurrent severe without psychotic features: Secondary | ICD-10-CM

## 2014-11-06 MED ORDER — VENLAFAXINE HCL ER 75 MG PO CP24
ORAL_CAPSULE | ORAL | Status: DC
Start: 2014-11-06 — End: 2014-12-18

## 2014-11-06 MED ORDER — CLONAZEPAM 0.5 MG PO TABS: ORAL_TABLET | ORAL | Status: DC

## 2014-11-06 MED ORDER — TRAZODONE HCL 50 MG PO TABS: 50.0000 mg | ORAL_TABLET | Freq: Every day | ORAL | Status: DC

## 2014-11-06 NOTE — Progress Notes (Addendum)
[facility] MD Progress Note  11/06/2014 3:05 PM Anne Terrell  MRN:  [id number] Subjective: Unfortunately a decline The patient acknowledges that for the last month or 2 she's felt much more depressed. She is isolating herself. Unfortunately she found out just a couple days ago that one of her dogs is going blind and has diabetes. These are the 2 dogs that she's had for years and that she's gotten close with since the death of her husband Kathlene November. It turns out the patient was not proactive that she said she would be on her last visit. She does go to a Bible study with some women weekly but she did not joined the choir and never went to hospice. She says she spends much for time in bed. The patient is in bed she reads but she feels depressed. She's lost weight including 5 pounds but she says is likely related to the fact that she needs new dentures. The patient is actually sleeping a normal amount of time. She can concentrate without a problem. Her self-esteem is clear reduced. The patient denies being suicidal at this time. She does not drink any alcohol. She's not suspicious nor she somatic. She demonstrates no psychotic symptoms. The patient is isolated with few friends. She has no relatives. She is not close with her neighbors. Today we discussed the importance of taking action and doing more than just staying home in bed. For reasons that are not clear this patient reduced her Effexor from 225 down to 150 mg. She does take trazodone 50. The patient says her appetite is reduced. Principal Problem: Major depression, recurrent episode mild Diagnosis: Major depression, recurrent episode mild Major depression, recurrent episode Patient Active Problem List   Diagnosis Date Noted  . Varicose veins of lower extremities with other complications [I83.899] 05/03/2014  . Major depressive disorder, recurrent episode, unspecified [F33.9] 04/11/2013  . Major depressive disorder, single episode, moderate [F32.1] 01/05/2013   . 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 Varicose veins   . Depression     Past Surgical History  Procedure Laterality Date  . Abdominal hysterectomy  1988  . Appendectomy     Family History:  Family History  Problem Relation Age of Onset  . Diabetes Mother   . Varicose Veins Mother    Social History:  History  Alcohol Use No     History  Drug Use No    History   Social History  . Marital Status: Married    Spouse Name: N/A  . Number of Children: N/A  . Years of Education: N/A   Social History Main Topics  . Smoking status: Former Smoker    Quit date: 05/03/2004  . Smokeless tobacco: Never Used  . Alcohol Use: No  . Drug Use: No  . Sexual Activity: Not on file   Other Topics Concern  . None   Social History Narrative   Additional History:    Sleep: Good  Appetite:  Fair   Assessment:   Musculoskeletal: Strength & Muscle Tone:  Gait & Station:  Patient leans:    Psychiatric Specialty Exam: Physical Exam  ROS  Blood pressure 127/69, pulse 88, height  (1.6 m), weight 148 lb (67.132 kg).Body mass index is 26.22 kg/(m^2).  General Appearance: Casual  Eye Contact::  Good  Speech:  Normal Rate  Volume:  Normal  Mood:  Depressed  Affect:  Appropriate  Thought Process:  Coherent  Orientation:  Full (Time, Place, and Person)  Thought Content:  WDL  Suicidal Thoughts:  No  Homicidal Thoughts:  No  Memory:  Negative  Judgement:  Good  Insight:  Good  Psychomotor Activity:  Decreased  Concentration:  Good  Recall:  Good  Fund of Knowledge:Good  Language: Good  Akathisia:  No  Handed:  Right  AIMS (if indicated):     Assets:  Desire for Improvement  ADL's:  Intact  Cognition: WNL  Sleep:        Current Medications: Current Outpatient Prescriptions   Medication Sig Dispense Refill  . clonazePAM (KLONOPIN) 0.5 MG tablet take 1 tablet by mouth twice a day if needed 60 tablet 3  . esomeprazole (NEXIUM) 40 MG capsule Take 40 mg by mouth.    . estradiol (ESTRACE) 1 MG tablet Take 1 mg by mouth.    . fentaNYL (DURAGESIC - DOSED MCG/HR) 100 MCG/HR Place 1 patch onto the skin.    . fentaNYL (FENTORA) 100 MCG buccal tablet Place 100 mcg inside cheek every 4 (four) hours as needed for breakthrough pain or severe pain.    . Oxycodone HCl 10 MG TABS Take 10 mg by mouth 3 (three) times daily.    Marland Kitchen. oxyCODONE-acetaminophen (PERCOCET) 10-325 MG per tablet Take 1 tablet by mouth.    Marland Kitchen. rOPINIRole (REQUIP) 0.5 MG tablet Take 0.5 mg by mouth.    . SUMAtriptan (IMITREX) 50 MG tablet Take 50 mg by mouth.    . traZODone (DESYREL) 50 MG tablet Take 1 tablet (50 mg total) by mouth at bedtime. 30 tablet 5  . venlafaxine XR (EFFEXOR-XR) 75 MG 24 hr capsule TAKE 3 CAPSULES BY MOUTH DAILY 90 capsule 5   No current facility-administered medications for this visit.    Lab Results: No results found for this or any previous visit (from the past 48 hour(s)).  Physical Findings: AIMS:  , ,  ,  ,    CIWA:    COWS:     Treatment Plan Summary: Medication management At this time the patient is agreed to return back to the full dose of Effexor 225 mg. She'll continue taking trazodone 50 mg. The patient agreed that she will step out and start doing some things. [facility] and agrees to call them. They apparently have an opportunity to some beading and jewelry activities. The patient is interested in that. The patient continue going to Bible study and reconsider joining the choir. This patient was seen again in 5 weeks. The patient was told to call us if any changes take place. Her next visit I would consider increasing her Effexor to the highest dose of 300 mg if she is not moving. This patient is not acutely suicidal.  Medical  Decision Making:  New problem, with additional work up planned     Arlette Schaad IRVING 11/06/2014, 3:05 PM

## 2014-12-18 ENCOUNTER — Ambulatory Visit (INDEPENDENT_AMBULATORY_CARE_PROVIDER_SITE_OTHER): Payer: Medicare Other | Admitting: Psychiatry

## 2014-12-18 VITALS — BP 108/63 | HR 83 | Ht 63.0 in | Wt 147.8 lb

## 2014-12-18 DIAGNOSIS — F329 Major depressive disorder, single episode, unspecified: Secondary | ICD-10-CM | POA: Diagnosis not present

## 2014-12-18 DIAGNOSIS — F331 Major depressive disorder, recurrent, moderate: Secondary | ICD-10-CM

## 2014-12-18 DIAGNOSIS — F332 Major depressive disorder, recurrent severe without psychotic features: Secondary | ICD-10-CM

## 2014-12-18 MED ORDER — VENLAFAXINE HCL ER 75 MG PO CP24: ORAL_CAPSULE | ORAL | Status: DC

## 2014-12-18 MED ORDER — CLONAZEPAM 0.5 MG PO TABS: ORAL_TABLET | ORAL | Status: DC

## 2014-12-18 MED ORDER — TRAZODONE HCL 50 MG PO TABS: 50.0000 mg | ORAL_TABLET | Freq: Every day | ORAL | Status: DC

## 2014-12-18 NOTE — Progress Notes (Addendum)
[facility]  12/18/2014 4:31 PM Anne Terrell  MRN:  [id number] Subjective: better Patient is clearly doing better. Her mood is deathly improved. She now is eating again. Her self-esteem is better. [facility] and she's on their mailing list the patient continues to go to Bible study. The patient is eating well. She's having family visitors come in about a week for a whole month. Her dogs are doing okay. The patients anxiety is well-controlled. The patient does not drink alcohol. She's actually turned the corner and feels better. She also ordered and planted trees in her lawn. The patient does go to physical therapy for her shoulder but overall overall she's physically healthy. rincipal Problem: Major Depression Diagnosis:  Major Depression Patient Active Problem List   Diagnosis Date Noted  . Varicose veins of lower extremities with other complications [I83.899] 05/03/2014  . Major depressive disorder, recurrent episode, unspecified [F33.9] 04/11/2013  . Major depressive disorder, single episode, moderate [F32.1] 01/05/2013  . 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 Varicose veins   . Depression     Past Surgical History  Procedure Laterality Date  . Abdominal hysterectomy  1988  . Appendectomy     Family History:  Family History  Problem Relation Age of Onset  . Diabetes Mother   . Varicose Veins Mother    Social History:  History  Alcohol Use No     History  Drug Use No    History   Social History  . Marital Status: Married    Spouse Name: N/A  . Number of Children: N/A  . Years of Education: N/A   Social History Main Topics  . Smoking status: Former Smoker    Quit date: 05/03/2004  . Smokeless tobacco: Never Used  . Alcohol Use: No  . Drug Use: No  .  Sexual Activity: Not on file   Other Topics Concern  . Not on file   Social History Narrative   Additional History:    Sleep: Good  Appetite:  Good   Assessment:   Musculoskeletal: Strength & Muscle Tone:  Gait & Station:  Patient leans:    Psychiatric Specialty Exam: Physical Exam  ROS  Blood pressure 108/63, pulse 83, height 5\' 3"  (1.6 m), weight 147 lb 12.8 oz (67.042 kg).Body mass index is 26.19 kg/(m^2).  General Appearance: Casual  Eye Contact::  Good  Speech:  Clear and Coherent  Volume:  Normal  Mood:  Euthymic  Affect:  Congruent  Thought Process:  Coherent  Orientation:  Full (Time, Place, and Person)  Thought Content:  WDL  Suicidal Thoughts:  No  Homicidal Thoughts:  No  Memory:  NA  Judgement:  NA  Insight:  Fair  Psychomotor Activity:  Normal  Concentration:  Good  Recall:  Good  Fund of Knowledge:Good  Language: Good  Akathisia:  No  Handed:  Right  AIMS (if indicated):     Assets:  Desire for Improvement  ADL's:  Intact  Cognition: WNL  Sleep:        Current Medications: Current Outpatient Prescriptions  Medication Sig Dispense Refill  . clonazePAM (KLONOPIN) 0.5 MG tablet take 1 tablet by mouth twice a day if needed 180 tablet 3  . esomeprazole (NEXIUM) 40 MG capsule Take 40 mg by mouth.    .Marland Kitchen  estradiol (ESTRACE) 1 MG tablet Take 1 mg by mouth.    . fentaNYL (DURAGESIC - DOSED MCG/HR) 100 MCG/HR Place 1 patch onto the skin.    . fentaNYL (FENTORA) 100 MCG buccal tablet Place 100 mcg inside cheek every 4 (four) hours as needed for breakthrough pain or severe pain.    . Oxycodone HCl 10 MG TABS Take 10 mg by mouth 3 (three) times daily.    Marland Kitchen oxyCODONE-acetaminophen (PERCOCET) 10-325 MG per tablet Take 1 tablet by mouth.    Marland Kitchen rOPINIRole (REQUIP) 0.5 MG tablet Take 0.5 mg by mouth.    . SUMAtriptan (IMITREX) 50 MG tablet Take 50 mg by mouth.    . traZODone (DESYREL) 50 MG tablet Take 1 tablet (50 mg total) by mouth at bedtime. 90 tablet 5   . venlafaxine XR (EFFEXOR-XR) 75 MG 24 hr capsule TAKE 3 CAPSULES BY MOUTH DAILY 270 capsule 4   No current facility-administered medications for this visit.    Lab Results: No results found for this or any previous visit (from the past 48 hour(s)).  Physical Findings: AIMS:  , ,  ,  ,    CIWA:    COWS:     Treatment Plan Summary: At this time the patient will continue taking Klonopin 0.5 mg twice a day and continue on Effexor 75 mg 3 in the morning. The patient also takes trazodone. The patient will continue being as active as she is which I think is been very therapeutic for her. This patient to return to see me in 3 months.   Medical Decision Making:  Established Problem, Stable/Improving (1)     Elford Evilsizer IRVING 12/18/2014, 4:31 PM

## 2015-03-19 ENCOUNTER — Ambulatory Visit (INDEPENDENT_AMBULATORY_CARE_PROVIDER_SITE_OTHER): Payer: Medicare Other | Admitting: Psychiatry

## 2015-03-19 VITALS — BP 122/70 | HR 80 | Ht 63.0 in | Wt 139.4 lb

## 2015-03-19 DIAGNOSIS — F331 Major depressive disorder, recurrent, moderate: Secondary | ICD-10-CM

## 2015-03-19 DIAGNOSIS — F332 Major depressive disorder, recurrent severe without psychotic features: Secondary | ICD-10-CM

## 2015-03-19 MED ORDER — VENLAFAXINE HCL ER 75 MG PO CP24: ORAL_CAPSULE | ORAL | Status: DC

## 2015-03-19 MED ORDER — TRAZODONE HCL 50 MG PO TABS: 50.0000 mg | ORAL_TABLET | Freq: Every day | ORAL | Status: DC

## 2015-03-19 MED ORDER — CLONAZEPAM 0.5 MG PO TABS: ORAL_TABLET | ORAL | Status: DC

## 2015-03-19 NOTE — Progress Notes (Signed)
[facility]  03/19/2015 4:31 PM Anne Terrell  MRN:  [id number] Subjective:  Not great The patient did enjoy having her sister and brother-in-law here for 1 month and during that time she felt good. I think this patient does have a depression but I think it's tied to being lonely. I don't think she is completely paralyzed by daily depression rather she has the capacity to distract herself. She doesn't have something plan to do she then gets depressed and then she is a hard time getting going. She has to work hard to fight being distressed she gets very anxious being alone. She sleeping well taking trazodone but her appetite is reduced. Her energy level she would describe is low she can still concentrate and has normal psychomotor functioning. She denies being suicidal. She enjoys going to church reading and watching TV and she has 2 dogs that she likes a great deal. The patient denies the use of alcohol and is not suicidal. The patient denies any psychotic symptoms at all. Fortunately the patient now is pain-free from her back. The patient works hard around her house and stays active as much as she can.  Principal Problem: major depression, recurrent episode Dia   major depression recurrent episode moderate  Patient Active Problem List   Diagnosis Date Noted  . Varicose veins of lower extremities with other complications [I83.899] 05/03/2014  . Major depressive disorder, recurrent episode, unspecified [F33.9] 04/11/2013  . Major depressive disorder, single episode, moderate [F32.1] 01/05/2013  . 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 Varicose veins   . Depression     Past Surgical History  Procedure Laterality Date  . Abdominal hysterectomy  1988  . Appendectomy     Family History:  Family History   Problem Relation Age of Onset  . Diabetes Mother   . Varicose Veins Mother    Social History:  History  Alcohol Use No     History  Drug Use No    History   Social History  . Marital Status: Married    Spouse Name: N/A  . Number of Children: N/A  . Years of Education: N/A   Social History Main Topics  . Smoking status: Former Smoker    Quit date: 05/03/2004  . Smokeless tobacco: Never Used  . Alcohol Use: No  . Drug Use: No  . Sexual Activity: Not on file   Other Topics Concern  . Not on file   Social History Narrative   Additional History:    Sleep: Good  Appetite: Poor   Assessment:   Musculoskeletal: Strength & Muscle Tone: within normal limits Gait & Station: normal Patient leans: Right   Psychiatric Specialty Exam: Physical Exam  ROS  Blood pressure 122/70, pulse 80, height 5\' 3"  (1.6 m), weight 139 lb 6.4 oz (63.231 kg).Body mass index is 24.7 kg/(m^2).  General Appearance: Fairly Groomed  Patent attorneyye Contact::  Good  Speech:  Clear and Coherent  Volume:  Normal  Mood:  Depressed  Affect:  Appropriate  Thought Process:  Coherent  Orientation:  Full (Time, Place, and Person)  Thought Content:  WDL  Suicidal Thoughts:  No  Homicidal Thoughts:  No  Memory:  NA  Judgement:  Good  Insight:  Fair  Psychomotor Activity:  Normal  Concentration:  Good  Recall:  Good  Fund of Knowledge:Good  Language: Good  Akathisia:  No  Handed:  Right  AIMS (if indicated):     Assets:  Desire for Improvement  ADL's:  Intact  Cognition: WNL  Sleep:        Current Medications: Current Outpatient Prescriptions  Medication Sig Dispense Refill  . clonazePAM (KLONOPIN) 0.5 MG tablet take 1 tablet by mouth twice a day if needed 180 tablet 4  . esomeprazole (NEXIUM) 40 MG capsule Take 40 mg by mouth.    . estradiol (ESTRACE) 1 MG tablet Take 1 mg by mouth.    . fentaNYL (DURAGESIC - DOSED MCG/HR) 100 MCG/HR Place 1 patch onto the skin.    . fentaNYL (FENTORA)  100 MCG buccal tablet Place 100 mcg inside cheek every 4 (four) hours as needed for breakthrough pain or severe pain.    . Oxycodone HCl 10 MG TABS Take 10 mg by mouth 3 (three) times daily.    Marland Kitchen oxyCODONE-acetaminophen (PERCOCET) 10-325 MG per tablet Take 1 tablet by mouth.    Marland Kitchen rOPINIRole (REQUIP) 0.5 MG tablet Take 0.5 mg by mouth.    . SUMAtriptan (IMITREX) 50 MG tablet Take 50 mg by mouth.    . traZODone (DESYREL) 50 MG tablet Take 1 tablet (50 mg total) by mouth at bedtime. 90 tablet 5  . venlafaxine XR (EFFEXOR-XR) 75 MG 24 hr capsule TAKE 3 CAPSULES BY MOUTH DAILY 270 capsule 5   No current facility-administered medications for this visit.    Lab Results: No results found for this or any previous visit (from the past 48 hour(s)).  Physical Findings: AIMS:  , ,  ,  ,    CIWA:    COWS:     Treatment Plan Summary: At this time we will continue this patient's Effexor 75 mg 3 in the morning. She'll continue taking trazodone which does help her sleep. This patient will continue taking Klonopin 0.5 mg twice a day. At this time we'll go ahead and refer this patient to Mrs. Merrily Pew a talented psychotherapist at [facility]. She'll be able to access her right away. This patient to return to see me in 3 months. This patient is #1 problem is that of depression but I think it is reactive in nature. In essence I think once this patient is focused and doing something with purpose and passion she'll be better and I think this will only be obtained through the process of psychotherapy. So my plan is to continue all her medications and add psychotherapy. This patient to return to see me in 3 months.   Medical Decision Making:  Self-Limited or Minor (1)     Anne Terrell 03/19/2015, 4:31 PM

## 2015-06-18 ENCOUNTER — Ambulatory Visit (HOSPITAL_COMMUNITY): Payer: Self-pay | Admitting: Psychiatry

## 2015-07-11 ENCOUNTER — Ambulatory Visit (INDEPENDENT_AMBULATORY_CARE_PROVIDER_SITE_OTHER): Payer: Medicare Other | Admitting: Psychiatry

## 2015-07-11 VITALS — BP 111/76 | HR 78 | Ht 63.0 in | Wt 140.0 lb

## 2015-07-11 DIAGNOSIS — F3342 Major depressive disorder, recurrent, in full remission: Secondary | ICD-10-CM

## 2015-07-11 DIAGNOSIS — F332 Major depressive disorder, recurrent severe without psychotic features: Secondary | ICD-10-CM

## 2015-07-11 DIAGNOSIS — F331 Major depressive disorder, recurrent, moderate: Secondary | ICD-10-CM

## 2015-07-11 MED ORDER — TRAZODONE HCL 50 MG PO TABS: 50.0000 mg | ORAL_TABLET | Freq: Every day | ORAL | Status: DC

## 2015-07-11 MED ORDER — VENLAFAXINE HCL ER 75 MG PO CP24: ORAL_CAPSULE | ORAL | Status: DC

## 2015-07-11 MED ORDER — CLONAZEPAM 0.5 MG PO TABS: ORAL_TABLET | ORAL | Status: DC

## 2015-07-11 NOTE — Progress Notes (Signed)
[facility]  07/11/2015 9:53 AM Juluis Mirelice Betsill  MRN:  [id number] Subjective: Feeling great Principal Problem: Major depression, recurrent episode, remission Diagnosis:  Major depression, recurrent episode, remission Today the patient is doing very well. Physically though she's having some back pain over the last one week. She has very aggressive treatment for her back. She takes a fentanyl patch 100 mg twice a day, OxyContin and has a pain interrupter built into her back. Lately though for the most part she's been much better terms of back pain. More importantly she denies daily depression. She is sleeping and eating well. She's got good energy. She can concentrate well. She's very very active. She is lots of friends. She goes on trips. She is learned to keep herself busy when she does she feels happy and energized. The patient denies using alcohol. She is no evidence of psychosis. She's not suicidal. She enjoys the TV and reading and her 2 dogs. She has a positive attitude. Today reviewed the idea that when she is completely alone with nothing to do she is at risk for feeling depressed in association with feeling lonely. I think consciously and subconsciously she is aware. Therefore she keeps herself busy all the time and feels great. I suspect even time alone she's not as depressed as in the past. She has a positive attitude. The patient chose not to go to therapy as was recommended on her last session. She said that her expressed affects of anger and irritability at the life she had in the past with her past husband has abated. She seems to let it go. We talked about the concept trusting and now that's important for future relationships. I suggested this would be another potential recently going to therapy to get prepped for new relationships. The patient said she think about it. For now though she's taking her medicines and doing very well. Patient Active Problem List   Diagnosis Date Noted  .  Varicose veins of lower extremities with other complications [I83.893] 05/03/2014  . Major depressive disorder, recurrent episode, unspecified [F33.9] 04/11/2013  . Major depressive disorder, single episode, moderate (HCC) [F32.1] 01/05/2013  . 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 Varicose veins   . Depression     Past Surgical History  Procedure Laterality Date  . Abdominal hysterectomy  1988  . Appendectomy     Family History:  Family History  Problem Relation Age of Onset  . Diabetes Mother   . Varicose Veins Mother    Family Psychiatric  History:  Social History:  History  Alcohol Use No     History  Drug Use No    Social History   Social History  . Marital Status: Married    Spouse Name: N/A  . Number of Children: N/A  . Years of Education: N/A   Social History Main Topics  . Smoking status: Former Smoker    Quit date: 05/03/2004  . Smokeless tobacco: Never Used  . Alcohol Use: No  . Drug Use: No  . Sexual Activity: Not on file   Other Topics Concern  . Not on file   Social History Narrative   Additional Social History:  Sleep: Good  Appetite:  Good  Current Medications: Current Outpatient Prescriptions  Medication Sig Dispense Refill  . clonazePAM (KLONOPIN) 0.5 MG tablet 1 qhs  1  q day prn 180 tablet 4  . esomeprazole (NEXIUM) 40 MG capsule Take 40 mg by mouth.    . estradiol (ESTRACE) 1 MG tablet Take 1 mg by mouth.    . fentaNYL (DURAGESIC - DOSED MCG/HR) 100 MCG/HR Place 1 patch onto the skin.    . fentaNYL (FENTORA) 100 MCG buccal tablet Place 100 mcg inside cheek every 4 (four) hours as needed for breakthrough pain or severe pain.    . Oxycodone HCl 10 MG TABS Take 10 mg by mouth 3 (three) times  daily.    Marland Kitchen oxyCODONE-acetaminophen (PERCOCET) 10-325 MG per tablet Take 1 tablet by mouth.    Marland Kitchen rOPINIRole (REQUIP) 0.5 MG tablet Take 0.5 mg by mouth.    . SUMAtriptan (IMITREX) 50 MG tablet Take 50 mg by mouth.    . traZODone (DESYREL) 50 MG tablet Take 1 tablet (50 mg total) by mouth at bedtime. 90 tablet 5  . venlafaxine XR (EFFEXOR-XR) 75 MG 24 hr capsule TAKE 3 CAPSULES BY MOUTH DAILY 270 capsule 5   No current facility-administered medications for this visit.    Lab Results: No results found for this or any previous visit (from the past 48 hour(s)).  Physical Findings: AIMS:  , ,  ,  ,    CIWA:    COWS:     Musculoskeletal: Strength & Muscle Tone: within normal limits Gait & Station: normal Patient leans: Right  Psychiatric Specialty Exam: ROS  Blood pressure 111/76, pulse 78, height  (1.6 m), weight 140 lb (63.504 kg).Body mass index is 24.81 kg/(m^2).  General Appearance: Casual  Eye Contact::  Good  Speech:  Clear and Coherent  Volume:  Normal  Mood: Good   Affect:  Congruent  Thought Process:  Coherent  Orientation:  Full (Time, Place, and Person)  Thought Content:  WDL  Suicidal Thoughts:  No  Homicidal Thoughts:  No  Memory:  NA  Judgement:  Good  Insight:  Good  Psychomotor Activity:  Normal  Concentration:  Good  Recall:  Good  Fund of Knowledge:Good  Language: Good  Akathisia:  No  Handed:  Right  AIMS (if indicated):     Assets:  Desire for Improvement  ADL's:  Intact  Cognition: WNL  Sleep:      Treatment Plan Summary: At this time the patient is doing very well. Her #1 problem is clinical depression and this seems to be well treated with 225 mg of Effexor in the morning, trazodone 50 mg to help her sleep and Klonopin 0.5 mg twice a day. Therefore her second problem sleep which is well-controlled with trazodone. Today the patient was educated on the dangers of getting up too quick after taking trazodone. It should be noted that she's on a  very low dose of trazodone I think it is reasonable to continue this agent. Since the patient is on so many opiates her second problem is that of pain control she'll continue these agents continue in a pain clinic. On the other hand I will go ahead and start reducing her Klonopin reducing it from twice a day to taking just once at night and 1 when necessary if needed. Her anxiety therefore is well-controlled. Today we spent more than 50% of the time educating her about her medications and her illness. This patient is not  suicidal and she is functioning extremely well. Presently she's not in a relationship but she is open to this.  Kaisyn Millea IRVING 07/11/2015, 9:53 AM

## 2015-10-15 ENCOUNTER — Ambulatory Visit (INDEPENDENT_AMBULATORY_CARE_PROVIDER_SITE_OTHER): Payer: Medicare Other | Admitting: Psychiatry

## 2015-10-15 ENCOUNTER — Telehealth (HOSPITAL_COMMUNITY): Payer: Self-pay

## 2015-10-15 ENCOUNTER — Encounter (HOSPITAL_COMMUNITY): Payer: Self-pay | Admitting: Psychiatry

## 2015-10-15 VITALS — BP 104/66 | HR 78 | Ht 63.0 in | Wt 143.6 lb

## 2015-10-15 DIAGNOSIS — F332 Major depressive disorder, recurrent severe without psychotic features: Secondary | ICD-10-CM

## 2015-10-15 DIAGNOSIS — F333 Major depressive disorder, recurrent, severe with psychotic symptoms: Secondary | ICD-10-CM

## 2015-10-15 MED ORDER — CLONAZEPAM 0.5 MG PO TABS: ORAL_TABLET | ORAL | Status: DC

## 2015-10-15 MED ORDER — VENLAFAXINE HCL ER 75 MG PO CP24: ORAL_CAPSULE | ORAL | Status: DC

## 2015-10-15 MED ORDER — TRAZODONE HCL 50 MG PO TABS: 50.0000 mg | ORAL_TABLET | Freq: Every day | ORAL | Status: DC

## 2015-10-15 NOTE — Telephone Encounter (Signed)
Met with Dr. Casimiro Needle who requested patient's Klonopin be called into [facility] this date as was printed by mistake.  Called and spoke with Lanny Hurst, with [facility] who reported controlled prescriptions must be faxed in for delivery.

## 2015-10-15 NOTE — Progress Notes (Signed)
[facility] MD Progress Note  10/15/2015 1:34 PM Altha Sweitzer  MRN:  [id number] Subjective:  Feeling well Principal Problem: Major depression, recurrent episode, remission Diagnosis:  Major depression, recurrent episode, remission Patient is actually doing very well. She's very active. Lastly she had a difficult time that she doesn't know why but this week she feels better. Her mood is good. She sleeping well as long she takes her sleeping medicine. She's eating well. The patient has good energy. The patient is volunteering her local church in [employer]. The patient has dogs that she carries a lot of outdoor not feeling well at this time. The patient is positive and optimistic and continues to work hard on her life. She denies any psychotic symptoms. She denies the use of alcohol. She denies being suicidal. Medically she feels well. She denies shortness of breath or chest pain or any neurological symptoms at this time. The patient takes her medicines regularly. Her concentration is good. At this time the patient is not in a relationship rheumatically but does have a number of close friends mainly from church. She feels well supported. Patient Active Problem List   Diagnosis Date Noted  . Varicose veins of lower extremities with other complications [I83.893] 05/03/2014  . Major depressive disorder, recurrent episode, unspecified [F33.9] 04/11/2013  . Major depressive disorder, single episode, moderate (HCC) [F32.1] 01/05/2013  . 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 Varicose veins   . Depression     Past Surgical History  Procedure Laterality Date  . Abdominal hysterectomy  1988  . Appendectomy     Family History:  Family History  Problem Relation Age of Onset  . Diabetes Mother    . Varicose Veins Mother    Family Psychiatric  History:  Social History:  History  Alcohol Use No     History  Drug Use No    Social History   Social History  . Marital Status: Married    Spouse Name: N/A  . Number of Children: N/A  . Years of Education: N/A   Social History Main Topics  . Smoking status: Former Smoker    Quit date: 05/03/2004  . Smokeless tobacco: Never Used  . Alcohol Use: No  . Drug Use: No  . Sexual Activity: Not on file   Other Topics Concern  . Not on file   Social History Narrative   Additional Social History:                         Sleep: Good  Appetite:  Good  Current Medications: Current Outpatient Prescriptions  Medication Sig Dispense Refill  . clonazePAM (KLONOPIN) 0.5 MG tablet 1 qhs  1  q day prn 180 tablet 4  . esomeprazole (NEXIUM) 40 MG capsule Take 40 mg by mouth.    . estradiol (ESTRACE) 1 MG tablet Take 1 mg by mouth.    . fentaNYL (DURAGESIC - DOSED MCG/HR) 100 MCG/HR Place 1 patch onto the skin.    . fentaNYL (FENTORA) 100 MCG buccal tablet Place 100 mcg inside cheek every 4 (four) hours as needed for breakthrough pain or severe pain.    . Oxycodone HCl 10 MG TABS Take 10 mg by mouth 3 (three) times daily.    Marland Kitchen oxyCODONE-acetaminophen (PERCOCET) 10-325 MG  per tablet Take 1 tablet by mouth.    Marland Kitchen rOPINIRole (REQUIP) 0.5 MG tablet Take 0.5 mg by mouth.    . SUMAtriptan (IMITREX) 50 MG tablet Take 50 mg by mouth.    . traZODone (DESYREL) 50 MG tablet Take 1 tablet (50 mg total) by mouth at bedtime. 90 tablet 5  . venlafaxine XR (EFFEXOR-XR) 75 MG 24 hr capsule TAKE 3 CAPSULES BY MOUTH DAILY 270 capsule 5   No current facility-administered medications for this visit.    Lab Results: No results found for this or any previous visit (from the past 48 hour(s)).  Physical Findings: AIMS:  , ,  ,  ,    CIWA:    COWS:     Musculoskeletal: Strength & Muscle Tone: within normal limits Gait & Station:  normal Patient leans: N/A  Psychiatric Specialty Exam: ROS  Blood pressure 104/66, pulse 78, height  (1.6 m), weight 143 lb 9.6 oz (65.137 kg).Body mass index is 25.44 kg/(m^2).  General Appearance: Casual  Eye Contact::  Good  Speech:  Clear and Coherent  Volume:  Normal  Mood:  Euthymic  Affect:  NA  Thought Process:  Coherent  Orientation:  Full (Time, Place, and Person)  Thought Content:  WDL  Suicidal Thoughts:  No  Homicidal Thoughts:  No  Memory:  NA  Judgement:  Good  Insight:  Good  Psychomotor Activity:  NA  Concentration:  Good  Recall:  Good  Fund of Knowledge:NA  Language: Good  Akathisia:  No  Handed:  Right  AIMS (if indicated):     Assets:  Communication Skills  ADL's:  Intact  Cognition: WNL  Sleep:      Treatment Plan Summary: At this time the patient's #1 problem is major clinical depression which is very well controlled. The patient clearly says resilience. She takes her medicine that of Effexor 75 mg 3 a day and does very well. Her second problem is associated anxiety where she takes a small dose of Klonopin and does very well. She also has problems with sleep but the Klonopin seems to help that. She also takes trazodone for sleep. The combination of Effexor trazodone and Klonopin worked well for this patient. She is medically very stable. Today we spent more than 50% of the time talking about her illness and the medications we're using for it. The patient is doing very well and return to see me in 4 months.  Lucas Mallow, MD 10/15/2015, 1:34 PM

## 2016-01-13 ENCOUNTER — Emergency Department (HOSPITAL_COMMUNITY)
Admission: EM | Admit: 2016-01-13 | Discharge: 2016-01-13 | Disposition: A | Discharge status: Home / Self Care | Payer: Medicare Other | Attending: Emergency Medicine | Admitting: Emergency Medicine

## 2016-01-13 ENCOUNTER — Encounter (HOSPITAL_COMMUNITY): Payer: Self-pay | Admitting: Emergency Medicine

## 2016-01-13 DIAGNOSIS — Z880 Allergy status to penicillin: Secondary | ICD-10-CM | POA: Diagnosis not present

## 2016-01-13 DIAGNOSIS — Z87891 Personal history of nicotine dependence: Secondary | ICD-10-CM | POA: Insufficient documentation

## 2016-01-13 DIAGNOSIS — Z79899 Other long term (current) drug therapy: Secondary | ICD-10-CM | POA: Diagnosis not present

## 2016-01-13 DIAGNOSIS — F329 Major depressive disorder, single episode, unspecified: Secondary | ICD-10-CM | POA: Insufficient documentation

## 2016-01-13 DIAGNOSIS — Z8679 Personal history of other diseases of the circulatory system: Secondary | ICD-10-CM | POA: Diagnosis not present

## 2016-01-13 DIAGNOSIS — H7292 Unspecified perforation of tympanic membrane, left ear: Secondary | ICD-10-CM | POA: Insufficient documentation

## 2016-01-13 DIAGNOSIS — H9202 Otalgia, left ear: Secondary | ICD-10-CM | POA: Diagnosis present

## 2016-01-13 MED ORDER — OFLOXACIN 0.3 % OT SOLN: 5.0000 [drp] | Freq: Three times a day (TID) | OTIC | Status: DC

## 2016-01-13 MED ORDER — AZITHROMYCIN 250 MG PO TABS: 250.0000 mg | ORAL_TABLET | Freq: Every day | ORAL | Status: DC

## 2016-01-13 NOTE — ED Notes (Signed)
Onset 3 days ago patient states felt like something crawling in left ear. Placed a metal brush in left ear accidentally pushed in the brush with other hand. States now cannot hear out of left ear. Pain currently 4/10 dull pain.

## 2016-01-13 NOTE — ED Provider Notes (Signed)
CSN: [id number]     Arrival date & time 01/13/16  1515 History  By signing my name below, I, Anne Terrell, attest that this documentation has been prepared under the direction and in the presence of non-physician practitioner, Teressa Lower, NP. Electronically Signed: Freida Terrell, Scribe. 01/13/2016. 3:43 PM.    Chief Complaint  Patient presents with  . Otalgia   The history is provided by the patient. No language interpreter was used.     HPI Comments:  Anne Terrell is a 75 y.o. female who presents to the Emergency Department complaining of moderate, left ear pain x 4 days. She reports associated decreased hearing in the left ear. Pt states she put an object with a metal tip in the ear and accidentally pushed it in too deep. She notes bleeding from the ear. Denies foreign body in the ear. She called ENT to make an appointment but they had no openings until July 2017. No alleviating factors noted. Pt has no other complaints or symptoms at this time.    Past Medical History  Diagnosis Date  . Varicose veins   . Depression    Past Surgical History  Procedure Laterality Date  . Abdominal hysterectomy  1988  . Appendectomy     Family History  Problem Relation Age of Onset  . Diabetes Mother   . Varicose Veins Mother    Social History  Substance Use Topics  . Smoking status: Former Smoker    Quit date: 05/03/2004  . Smokeless tobacco: Never Used  . Alcohol Use: No   OB History    No data available     Review of Systems  Constitutional: Negative for fever and chills.  HENT: Positive for ear discharge (blood), ear pain and hearing loss.   Respiratory: Negative for shortness of breath.   Cardiovascular: Negative for chest pain.  All other systems reviewed and are negative.  Allergies  Aspirin; Penicillins; and Shellfish allergy  Home Medications   Prior to Admission medications   Medication Sig Start Date End Date Taking? Authorizing Provider  clonazePAM  (KLONOPIN) 0.5 MG tablet 1 qhs  1  q day prn 10/15/15   Archer Asa, MD  esomeprazole (NEXIUM) 40 MG capsule Take 40 mg by mouth.    Historical Provider, MD  estradiol (ESTRACE) 1 MG tablet Take 1 mg by mouth.    Historical Provider, MD  fentaNYL (DURAGESIC - DOSED MCG/HR) 100 MCG/HR Place 1 patch onto the skin.    Historical Provider, MD  fentaNYL (FENTORA) 100 MCG buccal tablet Place 100 mcg inside cheek every 4 (four) hours as needed for breakthrough pain or severe pain.    Historical Provider, MD  Oxycodone HCl 10 MG TABS Take 10 mg by mouth 3 (three) times daily.    Historical Provider, MD  oxyCODONE-acetaminophen (PERCOCET) 10-325 MG per tablet Take 1 tablet by mouth.    Historical Provider, MD  rOPINIRole (REQUIP) 0.5 MG tablet Take 0.5 mg by mouth.    Historical Provider, MD  SUMAtriptan (IMITREX) 50 MG tablet Take 50 mg by mouth.    Historical Provider, MD  traZODone (DESYREL) 50 MG tablet Take 1 tablet (50 mg total) by mouth at bedtime. 10/15/15   Archer Asa, MD  venlafaxine XR (EFFEXOR-XR) 75 MG 24 hr capsule TAKE 3 CAPSULES BY MOUTH DAILY 10/15/15   Archer Asa, MD   Pulse 111  Temp(Src) 97.4 F (36.3 C) (Oral)  Resp 18  Ht  (1.6 m)  Wt 140 lb (63.504 kg)  BMI 24.81 kg/m2  SpO2 98% Physical Exam  Constitutional: She is oriented to person, place, and time. She appears well-developed and well-nourished. No distress.  HENT:  Head: Normocephalic and atraumatic.  Right Ear: External ear normal.  Left Ear: Tympanic membrane is perforated.  Eyes: Conjunctivae are normal.  Cardiovascular: Normal rate.   Pulmonary/Chest: Effort normal.  Abdominal: She exhibits no distension.  Neurological: She is alert and oriented to person, place, and time.  Skin: Skin is warm and dry.  Psychiatric: She has a normal mood and affect.  Nursing note and vitals reviewed.   ED Course  Procedures   DIAGNOSTIC STUDIES:  Oxygen Saturation is 98% on RA, normal by my interpretation.     COORDINATION OF CARE:  3:42 PM Discussed treatment plan with pt at bedside and pt agreed to plan.   MDM   Final diagnoses:  Ruptured ear drum, left    Will treat with antibiotic and have follow up with ent  I personally performed the services described in this documentation, which was scribed in my presence. The recorded information has been reviewed and is accurate.    Teressa LowerVrinda Olivya Sobol, NP 01/13/16 1608  Blane OharaJoshua Zavitz, MD 01/13/16 [phone]

## 2016-01-13 NOTE — Discharge Instructions (Signed)
Follow up with ent as discussed Eardrum Perforation An eardrum perforation is a puncture or tear in the eardrum. This is also called a ruptured eardrum. The eardrum is a thin, round tissue inside of your ear that separates your ear canal from your middle ear. This is the tissue that detects sound and enables you to hear. An eardrum perforation can cause discomfort and hearing loss. In most cases, the eardrum will heal without treatment and with little or no permanent hearing loss. CAUSES An eardrum perforation can result from different causes, including:  Sudden pressure changes that happen in situations such as scuba diving or flying in an airplane.  Foreign objects in the ear.  Inserting a cotton-tipped swab or any blunt object into the ear.  Loud noise.  Trauma to the ear.  Attempting to remove an object from the ear. SIGNS AND SYMPTOMS  Hearing loss.  Ear pain.  Ringing in the ear.  Discharge or bleeding from the ear.  Dizziness.  Vomiting.  Facial paralysis. DIAGNOSIS  Your health care provider will examine your ear using a tool called an otoscope. This tool allows the health care provider to see into your ear to examine your eardrum. Various types of hearing tests may also be done. TREATMENT  Typically, the eardrum will heal on its own within a few weeks. If your eardrum does not heal, your health care provider may recommend one of the following treatments:  A procedure to place a patch over your eardrum.  Surgery to repair your eardrum. HOME CARE INSTRUCTIONS   Keep your ear dry. This will improve healing. Do not submerge your head under water until healing is complete. Do not swim or dive until your health care provider approves. While bathing or showering, protect your ear using one of these methods:  Using a waterproof earplug.  Covering a piece of cotton with petroleum jelly and placing it in your outer ear canal.  Take medicines only as directed by your  health care provider.  Avoid blowing your nose if possible. If you blow your nose, do it gently. Forceful blowing increases the pressure in your middle ear. This may cause further injury or may delay your healing.  Resume your normal activities after the perforation has healed. Your health care provider can tell you when this has occurred.  Talk to your health care provider before you fly on an airplane. Air travel is generally allowed with a perforated eardrum.  Keep all follow-up visits as directed by your health care provider. This is important. SEEK MEDICAL CARE IF:  You have a fever. SEEK IMMEDIATE MEDICAL CARE IF:  You have blood or pus coming from your ear.  You have dizziness or problems with balance.  You have nausea or vomiting.  You have increased pain.   This information is not intended to replace advice given to you by your health care provider. Make sure you discuss any questions you have with your health care provider.   Document Released: 08/20/2000 Document Revised: 09/13/2014 Document Reviewed: 04/01/2014 Elsevier Interactive Patient Education [employer]2016 Elsevier Inc.

## 2016-02-12 ENCOUNTER — Ambulatory Visit (INDEPENDENT_AMBULATORY_CARE_PROVIDER_SITE_OTHER): Payer: Medicare Other | Admitting: Psychiatry

## 2016-02-12 VITALS — BP 128/76 | HR 82 | Ht 63.0 in | Wt 145.8 lb

## 2016-02-12 DIAGNOSIS — F3342 Major depressive disorder, recurrent, in full remission: Secondary | ICD-10-CM

## 2016-02-12 DIAGNOSIS — F332 Major depressive disorder, recurrent severe without psychotic features: Secondary | ICD-10-CM

## 2016-02-12 MED ORDER — CLONAZEPAM 0.5 MG PO TABS: ORAL_TABLET | ORAL | Status: DC

## 2016-02-12 MED ORDER — TRAZODONE HCL 50 MG PO TABS: 50.0000 mg | ORAL_TABLET | Freq: Every day | ORAL | Status: DC

## 2016-02-12 MED ORDER — VENLAFAXINE HCL ER 75 MG PO CP24: ORAL_CAPSULE | ORAL | Status: DC

## 2016-02-12 NOTE — Progress Notes (Signed)
Patient ID: Anne Terrell, female   DOB: 1941-01-08, 75 y.o.   [id number] [facility]  02/12/2016 2:13 PM Anne Mirelice Kocurek  MRN:  [id number] Subjective:  Feeling well Principal Problem: Major depression, recurrent episode, remission Patient is doing very well. She feels better. He's [place]. The patient's life is revolving around her too small to all. The daughter not well. He has diabetes and she cooks for them. The patient denies daily depression. She sleeping and eating very well. She says her mood is distinctly improved. Her energy level is good. She loves to read watch TV. He also is working on a car. The patient can concentrate well. Her biggest stresses back pain. She also has progressive pain in her legs. She denies chest pain or shortness of breath or any other respiratory problems. She's eating and sleeping fairly well. The patient denies any psychotic symptoms. The patient is active and energetic. She goes to church regularly. She goes to Bible study. There are thousand members in her church which is a hard time really getting close to anyone. She has no romance at this time. Overall though the patient is up and positive. She is looking forward for a [place] trip from [place]. She denies the use of any alcohol. Physically she is very stable. Takes the patient takes her medicine as prescribed. Total Time spent with patient: 30 minutes  Past Psychiatric History:   Past Medical History:  Past Medical History  Diagnosis Date  . Varicose veins   . Depression     Past Surgical History  Procedure Laterality Date  . Abdominal hysterectomy  1988  . Appendectomy     Family History:  Family History  Problem Relation Age of Onset  . Diabetes Mother   . Varicose Veins Mother    Family Psychiatric  History:  Social History:  History  Alcohol Use No     History  Drug Use No    Social History   Social History  . Marital Status: Married     Spouse Name: N/A  . Number of Children: N/A  . Years of Education: N/A   Social History Main Topics  . Smoking status: Former Smoker    Quit date: 05/03/2004  . Smokeless tobacco: Never Used  . Alcohol Use: No  . Drug Use: No  . Sexual Activity: Not on file   Other Topics Concern  . Not on file   Social History Narrative   Additional Social History:                         Sleep: Good  Appetite:  Good  Current Medications: Current Outpatient Prescriptions  Medication Sig Dispense Refill  . azithromycin (ZITHROMAX) 250 MG tablet Take 1 tablet (250 mg total) by mouth daily. Take first 2 tablets together, then 1 every day until finished. 6 tablet 0  . clonazePAM (KLONOPIN) 0.5 MG tablet 1 qhs  1  q day prn 180 tablet 4  . esomeprazole (NEXIUM) 40 MG capsule Take 40 mg by mouth.    . estradiol (ESTRACE) 1 MG tablet Take 1 mg by mouth.    . fentaNYL (DURAGESIC - DOSED MCG/HR) 100 MCG/HR Place 1 patch onto the skin.    . fentaNYL (FENTORA) 100 MCG buccal tablet Place 100 mcg inside cheek every 4 (four) hours as needed for breakthrough pain or severe pain.    Marland Kitchen. ofloxacin (FLOXIN) 0.3 % otic  solution Place 5 drops into the left ear 3 (three) times daily. 5 mL 0  . Oxycodone HCl 10 MG TABS Take 10 mg by mouth 3 (three) times daily.    Marland Kitchen oxyCODONE-acetaminophen (PERCOCET) 10-325 MG per tablet Take 1 tablet by mouth.    Marland Kitchen rOPINIRole (REQUIP) 0.5 MG tablet Take 0.5 mg by mouth.    . SUMAtriptan (IMITREX) 50 MG tablet Take 50 mg by mouth.    . traZODone (DESYREL) 50 MG tablet Take 1 tablet (50 mg total) by mouth at bedtime. 90 tablet 2  . venlafaxine XR (EFFEXOR-XR) 75 MG 24 hr capsule TAKE 3 CAPSULES BY MOUTH DAILY 270 capsule 7   No current facility-administered medications for this visit.    Lab Results: No results found for this or any previous visit (from the past 48 hour(s)).  Physical Findings: AIMS:  , ,  ,  ,    CIWA:    COWS:      Musculoskeletal: Strength & Muscle Tone: within normal limits Gait & Station: normal Patient leans: N/A  Psychiatric Specialty Exam: ROS  Blood pressure 128/76, pulse 82, height  (1.6 m), weight 145 lb 12.8 oz (66.134 kg).Body mass index is 25.83 kg/(m^2).  General Appearance: Casual  Eye Contact::  Good  Speech:  Clear and Coherent  Volume:  Normal  Mood:  Euthymic  Affect:  NA  Thought Process:  Coherent  Orientation:  Full (Time, Place, and Person)  Thought Content:  WDL  Suicidal Thoughts:  No  Homicidal Thoughts:  No  Memory:  NA  Judgement:  Good  Insight:  Good  Psychomotor Activity:  NA  Concentration:  Good  Recall:  Good  Fund of Knowledge:NA  Language: Good  Akathisia:  No  Handed:  Right  AIMS (if indicated):     Assets:  Communication Skills  ADL's:  Intact  Cognition: WNL  Sleep:      Treatment Plan Summary: 02/12/2016, 2:13 PM This patient's #1 problem is clinical depression which is much better. She takes Effexor as prescribed 75 mg 3 a day and she takes a small dose of Klonopin that helps her sleep at night. He also takes trazodone 50 mg. The patient physically is very healthy. She's not fallen at all. Her daughter 2 problem is that of pain. He is actively involved in a pain clinic. She takes multiple pain medications. This is her major problem other than some financial stresses. Overall the patient is very stable. She'll return to see me in 4 months for a 30 minute visit. The patient denies any significant other medical problems other than chronic pain. She certainly is not suicidal certainly not homicidal and is functioning well.

## 2016-03-15 ENCOUNTER — Other Ambulatory Visit: Payer: Self-pay | Admitting: Pain Medicine

## 2016-03-15 DIAGNOSIS — M545 Low back pain: Secondary | ICD-10-CM

## 2016-05-13 ENCOUNTER — Other Ambulatory Visit: Payer: Self-pay

## 2016-06-04 ENCOUNTER — Ambulatory Visit (HOSPITAL_COMMUNITY)
Admission: RE | Admit: 2016-06-04 | Discharge: 2016-06-04 | Disposition: A | Discharge status: Home / Self Care | Payer: Medicare Other | Source: Ambulatory Visit | Attending: Pain Medicine | Admitting: Pain Medicine

## 2016-06-04 ENCOUNTER — Other Ambulatory Visit: Payer: Self-pay | Admitting: Pain Medicine

## 2016-06-04 ENCOUNTER — Ambulatory Visit
Admission: RE | Admit: 2016-06-04 | Discharge: 2016-06-04 | Disposition: A | Discharge status: Home / Self Care | Payer: Medicare Other | Source: Ambulatory Visit | Attending: Pain Medicine | Admitting: Pain Medicine

## 2016-06-04 DIAGNOSIS — R1031 Right lower quadrant pain: Secondary | ICD-10-CM

## 2016-06-04 DIAGNOSIS — M545 Low back pain: Secondary | ICD-10-CM | POA: Diagnosis not present

## 2016-06-04 DIAGNOSIS — M47896 Other spondylosis, lumbar region: Secondary | ICD-10-CM | POA: Insufficient documentation

## 2016-06-16 ENCOUNTER — Ambulatory Visit (HOSPITAL_COMMUNITY): Payer: Self-pay | Admitting: Psychiatry

## 2016-08-13 ENCOUNTER — Ambulatory Visit (HOSPITAL_COMMUNITY): Payer: Self-pay | Admitting: Psychiatry

## 2016-09-03 ENCOUNTER — Ambulatory Visit (INDEPENDENT_AMBULATORY_CARE_PROVIDER_SITE_OTHER): Payer: Medicare Other | Admitting: Psychiatry

## 2016-09-03 ENCOUNTER — Encounter (HOSPITAL_COMMUNITY): Payer: Self-pay | Admitting: Psychiatry

## 2016-09-03 VITALS — BP 118/64 | HR 110 | Ht 63.0 in | Wt 145.6 lb

## 2016-09-03 DIAGNOSIS — Z87891 Personal history of nicotine dependence: Secondary | ICD-10-CM

## 2016-09-03 DIAGNOSIS — Z79899 Other long term (current) drug therapy: Secondary | ICD-10-CM

## 2016-09-03 DIAGNOSIS — Z9889 Other specified postprocedural states: Secondary | ICD-10-CM | POA: Diagnosis not present

## 2016-09-03 DIAGNOSIS — Z833 Family history of diabetes mellitus: Secondary | ICD-10-CM

## 2016-09-03 DIAGNOSIS — Z8489 Family history of other specified conditions: Secondary | ICD-10-CM

## 2016-09-03 DIAGNOSIS — F332 Major depressive disorder, recurrent severe without psychotic features: Secondary | ICD-10-CM

## 2016-09-03 DIAGNOSIS — F3342 Major depressive disorder, recurrent, in full remission: Secondary | ICD-10-CM

## 2016-09-03 DIAGNOSIS — Z79891 Long term (current) use of opiate analgesic: Secondary | ICD-10-CM

## 2016-09-03 MED ORDER — VENLAFAXINE HCL ER 75 MG PO CP24: ORAL_CAPSULE | ORAL | 2 refills | Status: DC

## 2016-09-03 MED ORDER — TRAZODONE HCL 50 MG PO TABS: 50.0000 mg | ORAL_TABLET | Freq: Every day | ORAL | 2 refills | Status: DC

## 2016-09-03 MED ORDER — CLONAZEPAM 0.5 MG PO TABS: ORAL_TABLET | ORAL | 4 refills | Status: DC

## 2016-09-03 NOTE — Progress Notes (Signed)
Concentration Patient ID: Anne Terrell, female   DOB: 06-08-1941, 75 y.o.   MR[facility]: [id number] [facility]  09/03/2016 9:54 AM Anne Mirelice Weinberg  MRN:  [id number] Subjective:  Feeling well Principal Problem: Major depression, recurrent episode, remission Today the patient is doing well. This is just a short visit. Unfortunately a number eating between her and I have been canceled. Usually as spent 30 minutes into a short. Of therapy with this individual. She does not see a psychotherapist. At this time fortunately the patient is actually doing well. Her pain symptoms are much improved. She's coming down on her fentanyl patch. She no longer takes oxycodone. She has hip pain which is successfully been treated with an injection. Overall the patient's mood is good. She sleeping and eating well. She has good energy. She takes her Effexor as prescribed. She also takes Klonopin which we clarified to take just at night. Patient also takes trazodone 50 mg. The patient denies the use of alcohol or drugs. [place] from [place] and is having family members coming [place] to [place] her. The patient shows no evidence of psychosis. The patient is functioning very well. She denies the use of any alcohol or drugs. She denies chest pain or shortness of breath or any other physical complaints. She is looking for a new primary care doctor. Total Time spent with patient: 30 minutes  Past Psychiatric History:   Past Medical History:  Past Medical History:  Diagnosis Date  . Depression   . Varicose veins     Past Surgical History:  Procedure Laterality Date  . ABDOMINAL HYSTERECTOMY  1988  . APPENDECTOMY     Family History:  Family History  Problem Relation Age of Onset  . Diabetes Mother   . Varicose Veins Mother    Family Psychiatric  History:  Social History:  History  Alcohol Use No     History  Drug Use No    Social History   Social History  . Marital status: Married    Spouse  name: N/A  . Number of children: N/A  . Years of education: N/A   Social History Main Topics  . Smoking status: Former Smoker    Quit date: 05/03/2004  . Smokeless tobacco: Never Used  . Alcohol use No  . Drug use: No  . Sexual activity: Not Asked   Other Topics Concern  . None   Social History Narrative  . None   Additional Social History:                         Sleep: Good  Appetite:  Good  Current Medications: Current Outpatient Prescriptions  Medication Sig Dispense Refill  . azithromycin (ZITHROMAX) 250 MG tablet Take 1 tablet (250 mg total) by mouth daily. Take first 2 tablets together, then 1 every day until finished. 6 tablet 0  . clonazePAM (KLONOPIN) 0.5 MG tablet 1 qhs  1  q day prn 180 tablet 4  . esomeprazole (NEXIUM) 40 MG capsule Take 40 mg by mouth.    . estradiol (ESTRACE) 1 MG tablet Take 1 mg by mouth.    . fentaNYL (DURAGESIC - DOSED MCG/HR) 100 MCG/HR Place 1 patch onto the skin.    . fentaNYL (FENTORA) 100 MCG buccal tablet Place 100 mcg inside cheek every 4 (four) hours as needed for breakthrough pain or severe pain.    Marland Kitchen. ofloxacin (FLOXIN) 0.3 % otic solution Place 5 drops into the  left ear 3 (three) times daily. 5 mL 0  . Oxycodone HCl 10 MG TABS Take 10 mg by mouth 3 (three) times daily.    Marland Kitchen. oxyCODONE-acetaminophen (PERCOCET) 10-325 MG per tablet Take 1 tablet by mouth.    Marland Kitchen. rOPINIRole (REQUIP) 0.5 MG tablet Take 0.5 mg by mouth.    . SUMAtriptan (IMITREX) 50 MG tablet Take 50 mg by mouth.    . traZODone (DESYREL) 50 MG tablet Take 1 tablet (50 mg total) by mouth at bedtime. 90 tablet 2  . venlafaxine XR (EFFEXOR-XR) 75 MG 24 hr capsule TAKE 3 CAPSULES BY MOUTH DAILY 270 capsule 2   No current facility-administered medications for this visit.     Lab Results: No results found for this or any previous visit (from the past 48 hour(s)).  Physical Findings: AIMS:  , ,  ,  ,    CIWA:    COWS:     Musculoskeletal: Strength & Muscle  Tone: within normal limits Gait & Station: normal Patient leans: N/A  Psychiatric Specialty Exam: ROS  Blood pressure 118/64, pulse (!) 110, height 5\' 3"  (1.6 m), weight 145 lb 9.6 oz (66 kg).Body mass index is 25.79 kg/m.  General Appearance: Casual  Eye Contact::  Good  Speech:  Clear and Coherent  Volume:  Normal  Mood:  Euthymic  Affect:  NA  Thought Process:  Coherent  Orientation:  Full (Time, Place, and Person)  Thought Content:  WDL  Suicidal Thoughts:  No  Homicidal Thoughts:  No  Memory:  NA  Judgement:  Good  Insight:  Good  Psychomotor Activity:  NA  Concentration:  Good  Recall:  Good  Fund of Knowledge:NA  Language: Good  Akathisia:  No  Handed:  Right  AIMS (if indicated):     Assets:  Communication Skills  ADL's:  Intact  Cognition: WNL  Sleep:      Treatment Plan Summary: 09/03/2016, 9:54 AM   This patient is doing well. Her first and major problem is that of clinical depression. She takes Effexor regularly and claims it has a dramatic effect. She takes trazodone to help her sleep at night and a small dose of Klonopin at night. She is eating well and has no vegetative symptoms. She is functioning very well. Her second problem is that of pain. The patient has an orthopedic problem recently got an injection which is very helpful. At this time she is coming off her opiate. She continues to be on a fentanyl patch but the dose is lower. The patient denies being suicidal. She is positive and optimistic. She'll return to see me in 2 months for a 30 minute session.

## 2016-09-29 ENCOUNTER — Telehealth (HOSPITAL_COMMUNITY): Payer: Self-pay

## 2016-09-29 NOTE — Telephone Encounter (Signed)
Medication mangement -Telephone message left for patient we had received her message requesting prescription for Klonopin be faxed to [facility] at Fax # [phone].  Left message this was approved by Dr. Donell BeersPlovsky and order faxed this date.

## 2016-11-03 ENCOUNTER — Encounter (HOSPITAL_COMMUNITY): Payer: Self-pay | Admitting: Psychiatry

## 2016-11-03 ENCOUNTER — Ambulatory Visit (INDEPENDENT_AMBULATORY_CARE_PROVIDER_SITE_OTHER): Payer: Medicare Other | Admitting: Psychiatry

## 2016-11-03 VITALS — BP 122/74 | HR 82 | Ht 63.0 in | Wt 150.0 lb

## 2016-11-03 DIAGNOSIS — F332 Major depressive disorder, recurrent severe without psychotic features: Secondary | ICD-10-CM

## 2016-11-03 DIAGNOSIS — Z87891 Personal history of nicotine dependence: Secondary | ICD-10-CM | POA: Diagnosis not present

## 2016-11-03 DIAGNOSIS — Z90710 Acquired absence of both cervix and uterus: Secondary | ICD-10-CM | POA: Diagnosis not present

## 2016-11-03 DIAGNOSIS — Z833 Family history of diabetes mellitus: Secondary | ICD-10-CM | POA: Diagnosis not present

## 2016-11-03 DIAGNOSIS — F325 Major depressive disorder, single episode, in full remission: Secondary | ICD-10-CM

## 2016-11-03 DIAGNOSIS — Z9889 Other specified postprocedural states: Secondary | ICD-10-CM

## 2016-11-03 DIAGNOSIS — Z79899 Other long term (current) drug therapy: Secondary | ICD-10-CM | POA: Diagnosis not present

## 2016-11-03 DIAGNOSIS — Z79891 Long term (current) use of opiate analgesic: Secondary | ICD-10-CM | POA: Diagnosis not present

## 2016-11-03 MED ORDER — TRAZODONE HCL 50 MG PO TABS: 50.0000 mg | ORAL_TABLET | Freq: Every day | ORAL | 2 refills | Status: DC

## 2016-11-03 MED ORDER — VENLAFAXINE HCL ER 75 MG PO CP24: ORAL_CAPSULE | ORAL | 2 refills | Status: DC

## 2016-11-03 MED ORDER — CLONAZEPAM 0.5 MG PO TABS: ORAL_TABLET | ORAL | 4 refills | Status: DC

## 2016-11-03 NOTE — Progress Notes (Signed)
Concentration Patient ID: Anne Terrell, female   DOB: November 30, 1940, 76 y.o.   MRN: [id number] [facility] MD Progress Note  11/03/2016 5:14 PM Macklyn Glandon  MRN:  [id number] Subjective:  Feeling well Principal Problem: Major depression, recurrent episode, remission Today the patient is doing very well. She denies any significant stresses. She is looking for to her family is going to be here in a few weeks from [place]. Patient says her pain is much better controlled. Reducing her fentanyl patch. She says since she feels better. She continues taking Effexor and Klonopin. She still looking for primary care physician. The patient says she sleeps and eats well. She's got good energy. He is no problem concentrating. She is a good sense of worth. She denies being suicidal now or ever. She never drinks or uses any illicit substances. The patient's mood is very good. The biggest stress probably is financial issues which she says is a chronic issue patient takes her medicines just as prescribed. Today reviewed the pros and cons of her medication she agreed to continue taking them as prescribed. Total Time spent with patient: 30 minutes  Past Psychiatric History:   Past Medical History:  Past Medical History:  Diagnosis Date  . Depression   . Varicose veins     Past Surgical History:  Procedure Laterality Date  . ABDOMINAL HYSTERECTOMY  1988  . APPENDECTOMY     Family History:  Family History  Problem Relation Age of Onset  . Diabetes Mother   . Varicose Veins Mother    Family Psychiatric  History:  Social History:  History  Alcohol Use No     History  Drug Use No    Social History   Social History  . Marital status: Married    Spouse name: N/A  . Number of children: N/A  . Years of education: N/A   Social History Main Topics  . Smoking status: Former Smoker    Quit date: 05/03/2004  . Smokeless tobacco: Never Used  . Alcohol use No  . Drug use: No  . Sexual activity: Not Asked    Other Topics Concern  . None   Social History Narrative  . None   Additional Social History:                         Sleep: Good  Appetite:  Good  Current Medications: Current Outpatient Prescriptions  Medication Sig Dispense Refill  . azithromycin (ZITHROMAX) 250 MG tablet Take 1 tablet (250 mg total) by mouth daily. Take first 2 tablets together, then 1 every day until finished. 6 tablet 0  . clonazePAM (KLONOPIN) 0.5 MG tablet 1 qhs  1  q day prn 180 tablet 4  . esomeprazole (NEXIUM) 40 MG capsule Take 40 mg by mouth.    . estradiol (ESTRACE) 1 MG tablet Take 1 mg by mouth.    . fentaNYL (DURAGESIC - DOSED MCG/HR) 100 MCG/HR Place 1 patch onto the skin.    . fentaNYL (DURAGESIC - DOSED MCG/HR) 75 MCG/HR Place 75 mcg onto the skin every other day.    . fentaNYL (FENTORA) 100 MCG buccal tablet Place 100 mcg inside cheek every 4 (four) hours as needed for breakthrough pain or severe pain.    Marland Kitchen gabapentin (NEURONTIN) 400 MG capsule Take 400 mg by mouth.    Marland Kitchen ofloxacin (FLOXIN) 0.3 % otic solution Place 5 drops into the left ear 3 (three) times daily. 5 mL 0  . Oxycodone  HCl 10 MG TABS Take 10 mg by mouth 3 (three) times daily.    Marland Kitchen. oxyCODONE-acetaminophen (PERCOCET) 10-325 MG per tablet Take 1 tablet by mouth.    Marland Kitchen. rOPINIRole (REQUIP) 0.5 MG tablet Take 0.5 mg by mouth.    . SUMAtriptan (IMITREX) 50 MG tablet Take 50 mg by mouth.    . traZODone (DESYREL) 50 MG tablet Take 1 tablet (50 mg total) by mouth at bedtime. 90 tablet 2  . venlafaxine XR (EFFEXOR-XR) 75 MG 24 hr capsule TAKE 3 CAPSULES BY MOUTH DAILY 270 capsule 2   No current facility-administered medications for this visit.     Lab Results: No results found for this or any previous visit (from the past 48 hour(s)).  Physical Findings: AIMS:  , ,  ,  ,    CIWA:    COWS:     Musculoskeletal: Strength & Muscle Tone: within normal limits Gait & Station: normal Patient leans: N/A  Psychiatric Specialty  Exam: ROS  Blood pressure 122/74, pulse 82, height 5\' 3"  (1.6 m), weight 150 lb (68 kg).Body mass index is 26.57 kg/m.  General Appearance: Casual  Eye Contact::  Good  Speech:  Clear and Coherent  Volume:  Normal  Mood:  Euthymic  Affect:  NA  Thought Process:  Coherent  Orientation:  Full (Time, Place, and Person)  Thought Content:  WDL  Suicidal Thoughts:  No  Homicidal Thoughts:  No  Memory:  NA  Judgement:  Good  Insight:  Good  Psychomotor Activity:  NA  Concentration:  Good  Recall:  Good  Fund of Knowledge:NA  Language: Good  Akathisia:  No  Handed:  Right  AIMS (if indicated):     Assets:  Communication Skills  ADL's:  Intact  Cognition: WNL  Sleep:      Treatment Plan Summary: 11/03/2016, 5:14 PM  Today the patient is doing well. Her mood is good. She is optimistic and positive. She is looking towards her family to come visit. She does all her institutional ADLs. She drives with no problem. He continues to do all her daily activities. Her mood is good. At this time we'll go ahead and continue her trazodone, her Effexor and her Klonopin as prescribed. This patient to return to see me in 4 months for a 30 minute visit. This is clearly a supportive psychotherapy session..Marland Kitchen

## 2017-03-03 ENCOUNTER — Ambulatory Visit (HOSPITAL_COMMUNITY): Payer: Self-pay | Admitting: Psychiatry

## 2017-03-04 ENCOUNTER — Ambulatory Visit (HOSPITAL_COMMUNITY): Payer: Self-pay | Admitting: Psychiatry

## 2017-03-04 ENCOUNTER — Other Ambulatory Visit (HOSPITAL_COMMUNITY): Payer: Self-pay

## 2017-03-04 DIAGNOSIS — F332 Major depressive disorder, recurrent severe without psychotic features: Secondary | ICD-10-CM

## 2017-05-13 ENCOUNTER — Ambulatory Visit (INDEPENDENT_AMBULATORY_CARE_PROVIDER_SITE_OTHER): Payer: Medicare Other | Admitting: Psychiatry

## 2017-05-13 ENCOUNTER — Encounter (HOSPITAL_COMMUNITY): Payer: Self-pay | Admitting: Psychiatry

## 2017-05-13 VITALS — BP 85/58 | HR 96 | Ht 63.0 in | Wt 143.4 lb

## 2017-05-13 DIAGNOSIS — F329 Major depressive disorder, single episode, unspecified: Secondary | ICD-10-CM

## 2017-05-13 DIAGNOSIS — Z87891 Personal history of nicotine dependence: Secondary | ICD-10-CM | POA: Diagnosis not present

## 2017-05-13 DIAGNOSIS — F341 Dysthymic disorder: Secondary | ICD-10-CM

## 2017-05-13 MED ORDER — CLONAZEPAM 0.5 MG PO TABS: ORAL_TABLET | ORAL | 2 refills | Status: DC

## 2017-05-13 NOTE — Progress Notes (Signed)
Concentration Patient ID: Anne Terrell, female   DOB: 1941-06-10, 76 y.o.   MRN: [id number] [facility] MD Progress Note  05/13/2017 10:35 AM Anne Terrell  MRN:  [id number] Subjective:  Feeling well Principal Problem: Major depression, recurrent episode, remission Today the patient is doing fairly well. There are some issues about her pain treatment. Actually she is using much less opiates. She still on fentanyl patch. Her pain clinic makes her feel guilty for being on opiate as well as a benzodiazepine. This patient emotionally is doing great. She's not depressed. She sleeping and eating well. She's got good energy. She stays active. Anterior pain is Morrie Sheldonshley well controlled. Her anxiety is very well controlled on a very small dose of Klonopin 0.5 twice a day. She takes her antidepressant as prescribed. The patient is no evidence of psychosis. She is functioning very well. She denies chest pain shortness of breath or any neurological symptoms.  Past Psychiatric History:   Past Medical History:  Past Medical History:  Diagnosis Date  . Depression   . Varicose veins     Past Surgical History:  Procedure Laterality Date  . ABDOMINAL HYSTERECTOMY  1988  . APPENDECTOMY     Family History:  Family History  Problem Relation Age of Onset  . Diabetes Mother   . Varicose Veins Mother    Family Psychiatric  History:  Social History:  History  Alcohol Use No     History  Drug Use No    Social History   Social History  . Marital status: Married    Spouse name: N/A  . Number of children: N/A  . Years of education: N/A   Social History Main Topics  . Smoking status: Former Smoker    Quit date: 05/03/2004  . Smokeless tobacco: Never Used  . Alcohol use No  . Drug use: No  . Sexual activity: No   Other Topics Concern  . None   Social History Narrative  . None   Additional Social History:                         Sleep: Good  Appetite:  Good  Current  Medications: Current Outpatient Prescriptions  Medication Sig Dispense Refill  . clonazePAM (KLONOPIN) 0.5 MG tablet 1 qhs  1  q day prn 180 tablet 2  . fentaNYL (DURAGESIC - DOSED MCG/HR) 100 MCG/HR Place 1 patch onto the skin.    Marland Kitchen. gabapentin (NEURONTIN) 400 MG capsule Take 400 mg by mouth.    . traZODone (DESYREL) 50 MG tablet Take 1 tablet (50 mg total) by mouth at bedtime. 90 tablet 2  . venlafaxine XR (EFFEXOR-XR) 75 MG 24 hr capsule TAKE 3 CAPSULES BY MOUTH DAILY 270 capsule 2  . azithromycin (ZITHROMAX) 250 MG tablet Take 1 tablet (250 mg total) by mouth daily. Take first 2 tablets together, then 1 every day until finished. (Patient not taking: Reported on 05/13/2017) 6 tablet 0  . esomeprazole (NEXIUM) 40 MG capsule Take 40 mg by mouth.    . estradiol (ESTRACE) 1 MG tablet Take 1 mg by mouth.    . fentaNYL (DURAGESIC - DOSED MCG/HR) 75 MCG/HR Place 75 mcg onto the skin every other day.    . fentaNYL (FENTORA) 100 MCG buccal tablet Place 100 mcg inside cheek every 4 (four) hours as needed for breakthrough pain or severe pain.    Marland Kitchen. ofloxacin (FLOXIN) 0.3 % otic solution Place 5 drops into the left ear 3 (  three) times daily. (Patient not taking: Reported on 05/13/2017) 5 mL 0  . Oxycodone HCl 10 MG TABS Take 10 mg by mouth 3 (three) times daily.    Marland Kitchen oxyCODONE-acetaminophen (PERCOCET) 10-325 MG per tablet Take 1 tablet by mouth.    Marland Kitchen rOPINIRole (REQUIP) 0.5 MG tablet Take 0.5 mg by mouth.    . SUMAtriptan (IMITREX) 50 MG tablet Take 50 mg by mouth.     No current facility-administered medications for this visit.     Lab Results: No results found for this or any previous visit (from the past 48 hour(s)).  Physical Findings: AIMS:  , ,  ,  ,    CIWA:    COWS:     Musculoskeletal: Strength & Muscle Tone: within normal limits Gait & Station: normal Patient leans: N/A  Psychiatric Specialty Exam: ROS  Blood pressure (!) 85/58, pulse 96, height  (1.6 m), weight 143 lb 6.4 oz (65  kg).Body mass index is 25.4 kg/m.  General Appearance: Casual  Eye Contact::  Good  Speech:  Clear and Coherent  Volume:  Normal  Mood:  Euthymic  Affect:  NA  Thought Process:  Coherent  Orientation:  Full (Time, Place, and Person)  Thought Content:  WDL  Suicidal Thoughts:  No  Homicidal Thoughts:  No  Memory:  NA  Judgement:  Good  Insight:  Good  Psychomotor Activity:  NA  Concentration:  Good  Recall:  Good  Fund of Knowledge:NA  Language: Good  Akathisia:  No  Handed:  Right  AIMS (if indicated):     Assets:  Communication Skills  ADL's:  Intact  Cognition: WNL  Sleep:      Treatment Plan Summary: 05/13/2017, 10:35 AM  At this time the patient will continue taking Klonopin 0.5 mg twice a day continue Effexor 75 mg 2 a day and will continue taking the Neurontin I'm prescribing. The combination of Neurontin and Klonopin I hope is having some beneficial effect in controlling her pain. Is my feeling and that given this patient is been on benzodiazepines chronically and takes opiates I do not believe they're dangerous to this. I think the perspective of starting these medicines for the beginning at higher doses is potentially dangerous. I think it chronic low doses with close monitoring as is happening with this patient I have no problem continuing her Klonopin while she is getting an opiate. This patient to return to see me in 4 months. This patient is not suicidal.

## 2017-07-26 ENCOUNTER — Telehealth (HOSPITAL_COMMUNITY): Payer: Self-pay

## 2017-07-26 DIAGNOSIS — F329 Major depressive disorder, single episode, unspecified: Secondary | ICD-10-CM

## 2017-07-26 MED ORDER — CLONAZEPAM 0.5 MG PO TABS: ORAL_TABLET | ORAL | 2 refills | Status: DC

## 2017-07-26 NOTE — Telephone Encounter (Signed)
Medication management - Telephone call with patient to inform Dr. Ladona Ridgelaylor provided a new Klonopin prescription for [facility] and we faxed this as needed to them this date.  Requested patient follow up with them and to let us know if they did not get the order as it was faxed to the phone number they provided. [phone] and patient agreed with plan.

## 2017-07-26 NOTE — Telephone Encounter (Signed)
Medication management - Telephone call with patient to follow up on message she left needing a refill of Klonopin to be sent to [facility]. Reported she would contact PCP for refill of Neurontin. Called [facility] to question if patient still had refills for needed Klonopin as last order provided 05/13/17 for 90 day supply + 2 refills. Anasia, representative reported they did not have this order and it needed to be faxed to [phone].  Reports they last filled in August for 90 day supply.  Informed patient Dr. Donell BeersPlovsky was on vacation this week and would see if another provider would assist with order.

## 2017-08-08 ENCOUNTER — Encounter: Payer: Self-pay | Admitting: Neurology

## 2017-08-08 ENCOUNTER — Ambulatory Visit (INDEPENDENT_AMBULATORY_CARE_PROVIDER_SITE_OTHER): Payer: Medicare Other | Admitting: Neurology

## 2017-08-08 ENCOUNTER — Encounter (INDEPENDENT_AMBULATORY_CARE_PROVIDER_SITE_OTHER): Payer: Self-pay

## 2017-08-08 VITALS — BP 134/79 | HR 83 | Ht 63.0 in | Wt 139.4 lb

## 2017-08-08 DIAGNOSIS — R51 Headache with orthostatic component, not elsewhere classified: Secondary | ICD-10-CM

## 2017-08-08 DIAGNOSIS — M792 Neuralgia and neuritis, unspecified: Secondary | ICD-10-CM | POA: Diagnosis not present

## 2017-08-08 DIAGNOSIS — R519 Headache, unspecified: Secondary | ICD-10-CM

## 2017-08-08 NOTE — Progress Notes (Signed)
[facility]    Provider:  Dr Jaynee Eagles Referring Provider: Unk Pinto, MD Primary Care Physician:  Unk Pinto, MD  CC:  Head pain  HPI:  Anne Terrell is a 76 y.o. female here as a referral from Dr. Melford Aase for Headaches and low back pain.  She has sudden pains in the head. Can happen anywhere in the head in the back (points to the occipitoparietal areas). They burn and they stay for days. Ongoing for over a year. One side at a time, one spot at a time. It is sensitive over the area. The head pains occur several days a week maybe more. The pain can stay for days, not stabbing, more burning and consistent pain for days. She can't seven touch her skin it is so painful, can be severe. Nothing makes it better except not touching it, unknown triggers. No trauma. No neck pain or radicular symptoms. She is also having frontal headaches. She wakes up with headaches. No rashes. She does feel some scabbing on the skin, recommend having dermatology examine the scalp for other causes. Vision changes as well. No other focal neurologic deficits, associated symptoms, inciting events or modifiable factors. She has low back pain, chronic and gets shots.She also reports memory changes, mild. No FHx of Alzheimers. Her sort-term memory is impaired.   Reviewed notes, labs and imaging from outside physicians, which showed:   Personally reviewed images and agree with the following:  L1-2:  Negative  L2-3:  Mild disc and mild facet degeneration without stenosis  L3-4:  Mild disc and mild facet degeneration without stenosis.  L4-5: Mild anterior slip. Mild disc space narrowing. Moderate facet degeneration. Negative for spinal or foraminal stenosis  L5-S1: Moderate to advanced disc degeneration with disc space narrowing and mild endplate spurring. No significant spinal or foraminal stenosis.  IMPRESSION: Lumbar degenerative changes as above. Negative for spinal stenosis or  neural compression.   Review of Systems: Patient complains of symptoms per HPI as well as the following symptoms: fatigue, joint pain, headache, depression. Pertinent negatives and positives per HPI. All others negative.   Social History   Socioeconomic History  . Marital status: Widowed    Spouse name: Not on file  . Number of children: 0  . Years of education: Not on file  . Highest education level: Some college, no degree  Social Needs  . Financial resource strain: Not on file  . Food insecurity - worry: Not on file  . Food insecurity - inability: Not on file  . Transportation needs - medical: Not on file  . Transportation needs - non-medical: Not on file  Occupational History  . Not on file  Tobacco Use  . Smoking status: Former Smoker    Types: Cigarettes    Last attempt to quit: 05/03/2004    Years since quitting: 13.2  . Smokeless tobacco: Never Used  Substance and Sexual Activity  . Alcohol use: No    Alcohol/week: 0.0 oz  . Drug use: No  . Sexual activity: No  Other Topics Concern  . Not on file  Social History Narrative   Lives at home alone   Right handed   2 cups of caffeine daily    Family History  Problem Relation Age of Onset  . Diabetes Mother   . Varicose Veins Mother   . Asthma Mother   . Heart defect Sister     Past Medical History:  Diagnosis Date  . Anxiety   . Arthralgia   .  Carpal tunnel syndrome   . DDD (degenerative disc disease), cervical   . DDD (degenerative disc disease), lumbar   . Depression   . GERD (gastroesophageal reflux disease)   . Migraine headache   . Myalgia   . Myofascial pain   . Osteoarthritis   . Right rotator cuff tear   . Spondylosis   . Varicose veins     Past Surgical History:  Procedure Laterality Date  . ABDOMINAL HYSTERECTOMY  1988  . APPENDECTOMY    . CARPAL TUNNEL RELEASE Bilateral   . FRACTURE SURGERY Left    LLE    Current Outpatient Medications  Medication Sig Dispense Refill  .  clonazePAM (KLONOPIN) 0.5 MG tablet 1 qhs  1  q day prn 180 tablet 2  . fentaNYL (DURAGESIC - DOSED MCG/HR) 75 MCG/HR Place 50 mcg onto the skin every 3 (three) days.     Marland Kitchen gabapentin (NEURONTIN) 400 MG capsule Take 400 mg by mouth 2 (two) times daily.     Marland Kitchen NARCAN 4 MG/0.1ML LIQD nasal spray kit Place 1 spray into the nose once as needed.     . OXYCODONE HCL PO Take 5 mg by mouth as needed. Pt states she takes about 1 every 3 days for pain    . traZODone (DESYREL) 50 MG tablet Take 1 tablet (50 mg total) by mouth at bedtime. 90 tablet 2  . venlafaxine XR (EFFEXOR-XR) 75 MG 24 hr capsule TAKE 3 CAPSULES BY MOUTH DAILY (Patient taking differently: Take 75 mg by mouth 2 (two) times daily. ) 270 capsule 2   No current facility-administered medications for this visit.     Allergies as of 08/08/2017 - Review Complete 08/08/2017  Allergen Reaction Noted  . Aspirin  01/13/2016  . Penicillins  05/03/2014  . Shellfish allergy  05/03/2014    Vitals: BP 134/79 (BP Location: Right Arm, Patient Position: Sitting)   Pulse 83   Ht '5\' 3"'  (1.6 m)   Wt 139 lb 6.4 oz (63.2 kg)   BMI 24.69 kg/m  Last Weight:  Wt Readings from Last 1 Encounters:  08/08/17 139 lb 6.4 oz (63.2 kg)   Last Height:   Ht Readings from Last 1 Encounters:  08/08/17 '5\' 3"'  (1.6 m)   Physical exam: Exam: Gen: NAD, conversant, well nourised, well groomed                     CV: RRR, no MRG. No Carotid Bruits. No peripheral edema, warm, nontender Eyes: Conjunctivae clear without exudates or hemorrhage  Neuro: Detailed Neurologic Exam  Speech:    Speech is normal; fluent and spontaneous with normal comprehension.  Cognition:    The patient is oriented to person, place, and time;     recent and remote memory intact;     language fluent;     normal attention, concentration,     fund of knowledge Cranial Nerves:    The pupils are equal, round, and reactive to light. The fundi are normal and spontaneous venous pulsations  are present. Visual fields are full to finger confrontation. Extraocular movements are intact. Trigeminal sensation is intact and the muscles of mastication are normal. The face is symmetric. The palate elevates in the midline. Hearing intact. Voice is normal. Shoulder shrug is normal. The tongue has normal motion without fasciculations.   Coordination:    Normal finger to nose and heel to shin. Normal rapid alternating movements.   Gait:    Heel-toe and tandem gait  are normal.   Motor Observation:    No asymmetry, no atrophy, and no involuntary movements noted. Tone:    Normal muscle tone.    Posture:    Posture is normal. normal erect    Strength:    Strength is V/V in the upper and lower limbs.      Sensation: intact to LT     Reflex Exam:  DTR's:    Deep tendon reflexes in the upper and lower extremities are normal bilaterally.   Toes:    The toes are downgoing bilaterally.   Clonus:    Clonus is absent.       Assessment/Plan: This is a very nice 76 year old female who has atypical neuralgia of the scalp.  Given her other symptoms including positional headache, new headache after the age of 7, frontal headache, memory loss and occipital headache need an MRI of the brain with and without contrast to evaluate for space-occupying lesion, compressive tumors, or other intracranial etiologies of her symptoms.  At this time she can continue with her Neurontin for symptomatic pain management.  Also discussed our clinical trials for memory changes including Environmental health practitioner.  For her back she should continue with preferred pain management and spine care.  To prevent or relieve headaches, try the following: Cool Compress. Lie down and place a cool compress on your head.  Avoid headache triggers. If certain foods or odors seem to have triggered your migraines in the past, avoid them. A headache diary might help you identify triggers.  Include physical activity in your daily  routine. Try a daily walk or other moderate aerobic exercise.  Manage stress. Find healthy ways to cope with the stressors, such as delegating tasks on your to-do list.  Practice relaxation techniques. Try deep breathing, yoga, massage and visualization.  Eat regularly. Eating regularly scheduled meals and maintaining a healthy diet might help prevent headaches. Also, drink plenty of fluids.  Follow a regular sleep schedule. Sleep deprivation might contribute to headaches Consider biofeedback. With this mind-body technique, you learn to control certain bodily functions - such as muscle tension, heart rate and blood pressure - to prevent headaches or reduce headache pain.    Proceed to emergency room if you experience new or worsening symptoms or symptoms do not resolve, if you have new neurologic symptoms or if headache is severe, or for any concerning symptom.   Provided education and documentation from American headache Society toolbox including articles on: chronic migraine medication overuse headache, chronic migraines, prevention of migraines, behavioral and other nonpharmacologic treatments for headache.  Orders Placed This Encounter  Procedures  . MR BRAIN W WO CONTRAST  . Basic Metabolic Panel     Sarina Ill, MD  [facility] [address] [place], [place] [address]  Phone [phone] Fax [phone]

## 2017-08-08 NOTE — Patient Instructions (Signed)
MRI of the brain w/wo contrast Lab test today

## 2017-08-09 ENCOUNTER — Telehealth: Payer: Self-pay | Admitting: *Deleted

## 2017-08-09 LAB — BASIC METABOLIC PANEL
BUN / CREAT RATIO: 27 (ref 12–28)
BUN: 23 mg/dL (ref 8–27)
CHLORIDE: 100 mmol/L (ref 96–106)
CO2: 28 mmol/L (ref 20–29)
Calcium: 9.6 mg/dL (ref 8.7–10.3)
Creatinine, Ser: 0.86 mg/dL (ref 0.57–1.00)
GFR calc non Af Amer: 67 mL/min/{1.73_m2} (ref >59)
GFR, EST AFRICAN AMERICAN: 77 mL/min/{1.73_m2} (ref >59)
GLUCOSE: 118 mg/dL — AB (ref 65–99)
POTASSIUM: 4.5 mmol/L (ref 3.5–5.2)
SODIUM: 142 mmol/L (ref 134–144)

## 2017-08-09 NOTE — Telephone Encounter (Signed)
-----   Message from Anson FretAntonia B Ahern, MD sent at 08/09/2017  9:05 AM EST ----- Labs unremarkable

## 2017-08-09 NOTE — Telephone Encounter (Signed)
Noted, thank you

## 2017-08-09 NOTE — Telephone Encounter (Signed)
Pt returned RN's call. Msg relayed, pt had no questions and was appreciative

## 2017-08-09 NOTE — Telephone Encounter (Signed)
Called and LVM for pt to call about results. Ok to inform pt labs unremarkable per AA,MD note if she calls

## 2017-08-25 ENCOUNTER — Ambulatory Visit
Admission: RE | Admit: 2017-08-25 | Discharge: 2017-08-25 | Disposition: A | Discharge status: Home / Self Care | Payer: Medicare Other | Source: Ambulatory Visit | Attending: Neurology | Admitting: Neurology

## 2017-08-25 DIAGNOSIS — R51 Headache with orthostatic component, not elsewhere classified: Secondary | ICD-10-CM

## 2017-08-25 DIAGNOSIS — R519 Headache, unspecified: Secondary | ICD-10-CM

## 2017-08-25 DIAGNOSIS — M792 Neuralgia and neuritis, unspecified: Secondary | ICD-10-CM

## 2017-08-25 MED ORDER — GADOBENATE DIMEGLUMINE 529 MG/ML IV SOLN: 12.0000 mL | Freq: Once | INTRAVENOUS | Status: DC | PRN

## 2017-08-26 ENCOUNTER — Telehealth: Payer: Self-pay | Admitting: *Deleted

## 2017-08-26 NOTE — Telephone Encounter (Addendum)
Called and spoke with patient about her MRI. She verbalized understanding that her MRI of her brain is normal for her age. She had no questions and verbalized appreciation for the call.   ----- Message from Anson FretAntonia B Ahern, MD sent at 08/25/2017  6:08 PM EST ----- MRI brain normal for age thanks

## 2017-09-14 ENCOUNTER — Ambulatory Visit (HOSPITAL_COMMUNITY): Payer: Self-pay | Admitting: Psychiatry

## 2017-10-11 ENCOUNTER — Other Ambulatory Visit (HOSPITAL_COMMUNITY): Payer: Self-pay | Admitting: Psychiatry

## 2017-12-02 ENCOUNTER — Ambulatory Visit (HOSPITAL_COMMUNITY): Payer: Medicare Other | Admitting: Psychiatry

## 2018-01-03 ENCOUNTER — Encounter

## 2018-01-03 ENCOUNTER — Ambulatory Visit (HOSPITAL_COMMUNITY): Payer: Self-pay | Admitting: Psychiatry

## 2018-01-06 ENCOUNTER — Encounter (HOSPITAL_COMMUNITY): Payer: Self-pay | Admitting: Psychiatry

## 2018-01-06 ENCOUNTER — Other Ambulatory Visit: Payer: Self-pay

## 2018-01-06 ENCOUNTER — Ambulatory Visit (INDEPENDENT_AMBULATORY_CARE_PROVIDER_SITE_OTHER): Payer: Medicare Other | Admitting: Psychiatry

## 2018-01-06 VITALS — BP 105/64 | HR 84 | Temp 98.1°F | Wt 136.4 lb

## 2018-01-06 DIAGNOSIS — F325 Major depressive disorder, single episode, in full remission: Secondary | ICD-10-CM

## 2018-01-06 DIAGNOSIS — Z87891 Personal history of nicotine dependence: Secondary | ICD-10-CM | POA: Diagnosis not present

## 2018-01-06 DIAGNOSIS — G8929 Other chronic pain: Secondary | ICD-10-CM | POA: Diagnosis not present

## 2018-01-06 DIAGNOSIS — F332 Major depressive disorder, recurrent severe without psychotic features: Secondary | ICD-10-CM

## 2018-01-06 MED ORDER — VENLAFAXINE HCL ER 37.5 MG PO CP24: ORAL_CAPSULE | ORAL | 0 refills | Status: DC

## 2018-01-06 NOTE — Progress Notes (Signed)
Concentration Patient ID: Anne Terrell, female   DOB: [birth year], 77 y.o.   MRN: [id number] [facility] MD Progress Note  05/10/4966 59:16 AM Anne Terrell  MRN:  [id number] Subjective:  Feeling well Principal Problem: Major depression, recurrent episode, remission Patient is actually doing very well emotionally. She still takes a fentanyl patch from her pain center.there is some confusion in the past about some other opiate that they claim she was taking but I believe that was lab error. This patient does not like to be a medicines. In fact she her self discontinued intact shears self reduced her Effexor taking 75 mg 2 in the morning to now taking just one. Her mood is good. She's very self-reliant. Is positive and optimistic. She sleeping and eating well. She has good energy. She enjoys doing a lot around the house. She's not in any relationships. In my care with her she had one episode of depression following the death of her husband which apparently was so severe he could not be described this is a normal period of bereavement. That is clearly resolved at this time. She benefited by being on Effexor and a low-dose of Klonopin. The patient does not drink any alcohol or use any drugs. Physically she is fairly stable.She denies chest pain or shortness of breath or any neurological time.  Past Psychiatric History:   Past Medical History:  Past Medical History:  Diagnosis Date  . Anxiety   . Arthralgia   . Carpal tunnel syndrome   . DDD (degenerative disc disease), cervical   . DDD (degenerative disc disease), lumbar   . Depression   . GERD (gastroesophageal reflux disease)   . Migraine headache   . Myalgia   . Myofascial pain   . Osteoarthritis   . Right rotator cuff tear   . Spondylosis   . Varicose veins     Past Surgical History:  Procedure Laterality Date  . ABDOMINAL HYSTERECTOMY  1988  . APPENDECTOMY    . CARPAL TUNNEL RELEASE Bilateral   . FRACTURE SURGERY Left    LLE   Family  History:  Family History  Problem Relation Age of Onset  . Diabetes Mother   . Varicose Veins Mother   . Asthma Mother   . Heart defect Sister    Family Psychiatric  History:  Social History:  Social History   Substance and Sexual Activity  Alcohol Use No  . Alcohol/week: 0.0 oz     Social History   Substance and Sexual Activity  Drug Use No    Social History   Socioeconomic History  . Marital status: Widowed    Spouse name: Not on file  . Number of children: 0  . Years of education: Not on file  . Highest education level: Some college, no degree  Occupational History  . Not on file  Social Needs  . Financial resource strain: Not on file  . Food insecurity:    Worry: Not on file    Inability: Not on file  . Transportation needs:    Medical: Not on file    Non-medical: Not on file  Tobacco Use  . Smoking status: Former Smoker    Types: Cigarettes    Last attempt to quit: 05/03/2004    Years since quitting: 13.6  . Smokeless tobacco: Never Used  Substance and Sexual Activity  . Alcohol use: No    Alcohol/week: 0.0 oz  . Drug use: No  . Sexual activity: Never  Lifestyle  . Physical activity:  Days per week: Not on file    Minutes per session: Not on file  . Stress: Not on file  Relationships  . Social connections:    Talks on phone: Not on file    Gets together: Not on file    Attends religious service: Not on file    Active member of club or organization: Not on file    Attends meetings of clubs or organizations: Not on file    Relationship status: Not on file  Other Topics Concern  . Not on file  Social History Narrative   Lives at home alone   Right handed   2 cups of caffeine daily   Additional Social History:                         Sleep: Good  Appetite:  Good  Current Medications: Current Outpatient Medications  Medication Sig Dispense Refill  . fentaNYL (DURAGESIC - DOSED MCG/HR) 25 MCG/HR patch Place onto the skin.    Marland Kitchen  gabapentin (NEURONTIN) 400 MG capsule Take 400 mg by mouth 2 (two) times daily.     Marland Kitchen NARCAN 4 MG/0.1ML LIQD nasal spray kit Place 1 spray into the nose once as needed.     . traZODone (DESYREL) 50 MG tablet TAKE 1 TABLET AT BEDTIME 90 tablet 2  . venlafaxine XR (EFFEXOR-XR) 37.5 MG 24 hr capsule 1  qam  For  2  weeks 30 capsule 0   No current facility-administered medications for this visit.     Lab Results: No results found for this or any previous visit (from the past 48 hour(s)).  Physical Findings: AIMS:  , ,  ,  ,    CIWA:    COWS:     Musculoskeletal: Strength & Muscle Tone: within normal limits Gait & Station: normal Patient leans: N/A  Psychiatric Specialty Exam: ROS  Blood pressure 105/64, pulse 84, temperature 98.1 F (36.7 C), weight 136 lb 6.4 oz (61.9 kg).Body mass index is 24.16 kg/m.  General Appearance: Casual  Eye Contact::  Good  Speech:  Clear and Coherent  Volume:  Normal  Mood:  Euthymic  Affect:  NA  Thought Process:  Coherent  Orientation:  Full (Time, Place, and Person)  Thought Content:  WDL  Suicidal Thoughts:  No  Homicidal Thoughts:  No  Memory:  NA  Judgement:  Good  Insight:  Good  Psychomotor Activity:  NA  Concentration:  Good  Recall:  Good  Fund of Knowledge:NA  Language: Good  Akathisia:  No  Handed:  Right  AIMS (if indicated):     Assets:  Communication Skills  ADL's:  Intact  Cognition: WNL  Sleep:      Treatment Plan Summary: 01/06/2018, 10:56 AM   At this time the patient will reduce her Effexor to 37.5 mg take it for 2 weeks. At the other 2 weeks she'll discontinue it. At that point she'll be off all psychiatric medications. The issues with her pain and her pain clinic are not resolved. She still has issues. At this time she is very stable from my point of view.The patient be given a return appointment in 3 months for a 15 minute visit just to check on her and perhaps and her care at that time.

## 2018-01-26 ENCOUNTER — Other Ambulatory Visit (HOSPITAL_COMMUNITY): Payer: Self-pay | Admitting: Family Medicine

## 2018-01-26 DIAGNOSIS — G8929 Other chronic pain: Secondary | ICD-10-CM

## 2018-01-26 DIAGNOSIS — M25551 Pain in right hip: Principal | ICD-10-CM

## 2018-02-03 ENCOUNTER — Ambulatory Visit (HOSPITAL_COMMUNITY)
Admission: RE | Admit: 2018-02-03 | Discharge: 2018-02-03 | Disposition: A | Discharge status: Home / Self Care | Payer: Medicare Other | Source: Ambulatory Visit | Attending: Family Medicine | Admitting: Family Medicine

## 2018-02-03 ENCOUNTER — Other Ambulatory Visit (HOSPITAL_COMMUNITY): Payer: Self-pay | Admitting: Family Medicine

## 2018-02-03 DIAGNOSIS — Z9889 Other specified postprocedural states: Secondary | ICD-10-CM | POA: Diagnosis not present

## 2018-02-03 DIAGNOSIS — Z90710 Acquired absence of both cervix and uterus: Secondary | ICD-10-CM | POA: Insufficient documentation

## 2018-02-03 DIAGNOSIS — G8929 Other chronic pain: Secondary | ICD-10-CM

## 2018-02-03 DIAGNOSIS — M25551 Pain in right hip: Secondary | ICD-10-CM | POA: Diagnosis present

## 2018-02-03 DIAGNOSIS — M1611 Unilateral primary osteoarthritis, right hip: Secondary | ICD-10-CM | POA: Insufficient documentation

## 2018-02-28 ENCOUNTER — Ambulatory Visit (INDEPENDENT_AMBULATORY_CARE_PROVIDER_SITE_OTHER): Payer: Medicare Other

## 2018-02-28 ENCOUNTER — Ambulatory Visit (INDEPENDENT_AMBULATORY_CARE_PROVIDER_SITE_OTHER): Payer: Medicare Other | Admitting: Orthopaedic Surgery

## 2018-02-28 DIAGNOSIS — M1611 Unilateral primary osteoarthritis, right hip: Secondary | ICD-10-CM | POA: Diagnosis not present

## 2018-02-28 NOTE — Progress Notes (Signed)
Office Visit Note   Patient: Anne Terrell           Date of Birth: [birth year]           MRN: [id number] Visit Date: 02/28/2018              Requested by: Jettie Pagan, NP [address] [place], [place] [address] PCP: Jettie Pagan, NP   Assessment & Plan: Visit Diagnoses:  1. Primary osteoarthritis of right hip     Plan: Impression is 77 year old female with end-stage degenerative joint disease of her right hip.  Patient has failed conservative treatment and continues to have pain and dysfunction with ADLs and night pain.  At this point patient wishes to proceed with a right total hip replacement after full discussion of risks and benefits and expected postoperative rehab and recovery.  She denies a history of DVT.  Follow-Up Instructions: Return for 2 week postop visit.   Orders:  Orders Placed This Encounter  Procedures  . XR HIP UNILAT W OR W/O PELVIS 2-3 VIEWS RIGHT   No orders of the defined types were placed in this encounter.     Procedures: No procedures performed   Clinical Data: No additional findings.   Subjective: Chief Complaint  Patient presents with  . Right Hip - Pain    2nd opinion    Patient is a 77 year old female who comes in for right hip pain for many years with groin pain that radiates through into the buttock and down into the knee.  She has had previous Toradol injection in her right hip with temporary relief.  She denies any back pain.  Denies any injuries.   Review of Systems  Constitutional: Negative.   HENT: Negative.   Eyes: Negative.   Respiratory: Negative.   Cardiovascular: Negative.   Endocrine: Negative.   Musculoskeletal: Negative.   Neurological: Negative.   Hematological: Negative.   Psychiatric/Behavioral: Negative.   All other systems reviewed and are negative.    Objective: Vital Signs: There were no vitals taken for this visit.  Physical Exam  Constitutional: She is oriented to person,  place, and time. She appears well-developed and well-nourished.  HENT:  Head: Normocephalic and atraumatic.  Eyes: EOM are normal.  Neck: Neck supple.  Pulmonary/Chest: Effort normal.  Abdominal: Soft.  Neurological: She is alert and oriented to person, place, and time.  Skin: Skin is warm. Capillary refill takes less than 2 seconds.  Psychiatric: She has a normal mood and affect. Her behavior is normal. Judgment and thought content normal.  Nursing note and vitals reviewed.   Ortho Exam Right hip exam shows a positive FADIR and positive Stinchfield sign.  Positive logroll.  Negative straight leg. Specialty Comments:  No specialty comments available.  Imaging: Xr Hip Unilat W Or W/o Pelvis 2-3 Views Right  Result Date: 02/28/2018 Advanced right hip degenerative joint disease with significant joint space narrowing and periarticular spurring.    PMFS History: Patient Active Problem List   Diagnosis Date Noted  . 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 Varicose veins of lower extremities with other complications 05/03/2014  . Major depressive disorder, recurrent episode, unspecified 04/11/2013  . Major depressive disorder, single episode, moderate (HCC) 01/05/2013  . Major depressive disorder, recurrent episode, severe, without mention of psychotic behavior 11/15/2012   Past Medical History:  Diagnosis Date  . Anxiety   . Arthralgia   . Carpal tunnel syndrome   . DDD (degenerative disc disease), cervical   . DDD (  degenerative disc disease), lumbar   . Depression   . GERD (gastroesophageal reflux disease)   . Migraine headache   . Myalgia   . Myofascial pain   . Osteoarthritis   . Right rotator cuff tear   . Spondylosis   . Varicose veins     Family History  Problem Relation Age of Onset  . Diabetes Mother   . Varicose Veins Mother   . Asthma Mother   . Heart defect Sister     Past Surgical History:  Procedure Laterality Date  . ABDOMINAL HYSTERECTOMY  1988  .  APPENDECTOMY    . CARPAL TUNNEL RELEASE Bilateral   . FRACTURE SURGERY Left    LLE   Social History   Occupational History  . Not on file  Tobacco Use  . Smoking status: Former Smoker    Types: Cigarettes    Last attempt to quit: 05/03/2004    Years since quitting: 13.8  . Smokeless tobacco: Never Used  Substance and Sexual Activity  . Alcohol use: No    Alcohol/week: 0.0 oz  . Drug use: No  . Sexual activity: Never

## 2018-03-01 NOTE — Pre-Procedure Instructions (Signed)
Anne Terrell  03/01/2018      [facility] [address] - [place], [place] - [address] [facility] [address] [facility] [place] [address] Phone: [phone] Fax: [phone]  [id number] - [place], [place] - [address] AT [facility] [address] [facility] [address] [place] [address] Phone: [phone] Fax: [phone]    Your procedure is scheduled on March 13, 2018.  Report to [facility] Admitting at 825 AM.  Call this number if you have problems the morning of surgery:  [phone]   Remember:  Do not eat or drink after midnight.      Take these medicines the morning of surgery with A SIP OF WATER (none)  7 days prior to surgery STOP taking any Aspirin(unless otherwise instructed by your surgeon), Aleve, Naproxen, Ibuprofen, Motrin, Advil, Goody's, BC's, all herbal medications, fish oil, and all vitamins    Do not wear jewelry, make-up or nail polish.  Do not wear lotions, powders, or perfumes, or deodorant.  Do not shave 48 hours prior to surgery.   Do not bring valuables to the hospital.  [facility] is not responsible for any belongings or valuables.  Contacts, dentures or bridgework may not be worn into surgery.  Leave your suitcase in the car.  After surgery it may be brought to your room.  For patients admitted to the hospital, discharge time will be determined by your treatment team.  Patients discharged the day of surgery will not be allowed to drive home.    [place]- Preparing For Surgery  Before surgery, you can play an important role. Because skin is not sterile, your skin needs to be as free of germs as possible. You can reduce the number of germs on your skin by washing with CHG (chlorahexidine gluconate) Soap before surgery.  CHG is an antiseptic cleaner which kills germs and bonds with the skin to continue killing germs even after washing.    Oral Hygiene is also important to reduce your risk of  infection.  Remember - BRUSH YOUR TEETH THE MORNING OF SURGERY WITH YOUR REGULAR TOOTHPASTE  Please do not use if you have an allergy to CHG or antibacterial soaps. If your skin becomes reddened/irritated stop using the CHG.  Do not shave (including legs and underarms) for at least 48 hours prior to first CHG shower. It is OK to shave your face.  Please follow these instructions carefully.   1. Shower the NIGHT BEFORE SURGERY and the MORNING OF SURGERY with CHG.   2. If you chose to wash your hair, wash your hair first as usual with your normal shampoo.  3. After you shampoo, rinse your hair and body thoroughly to remove the shampoo.  4. Use CHG as you would any other liquid soap. You can apply CHG directly to the skin and wash gently with a scrungie or a clean washcloth.   5. Apply the CHG Soap to your body ONLY FROM THE NECK DOWN.  Do not use on open wounds or open sores. Avoid contact with your eyes, ears, mouth and genitals (private parts). Wash Face and genitals (private parts)  with your normal soap.  6. Wash thoroughly, paying special attention to the area where your surgery will be performed.  7. Thoroughly rinse your body with warm water from the neck down.  8. DO NOT shower/wash with your normal soap after using and rinsing off the CHG Soap.  9. Pat yourself dry with a CLEAN TOWEL.  10. Wear CLEAN PAJAMAS to bed the night before surgery, wear comfortable clothes the morning of surgery  11. Place CLEAN SHEETS on your bed the night of your first shower and DO NOT SLEEP WITH PETS.  Day of Surgery:  Do not apply any deodorants/lotions.  Please wear clean clothes to the hospital/surgery center.   Remember to brush your teeth WITH YOUR REGULAR TOOTHPASTE.   Please read over the following fact sheets that you were given. Pain Booklet, Coughing and Deep Breathing, MRSA Information and Surgical Site Infection Prevention

## 2018-03-02 ENCOUNTER — Encounter (HOSPITAL_COMMUNITY)
Admission: RE | Admit: 2018-03-02 | Discharge: 2018-03-02 | Disposition: A | Discharge status: Home / Self Care | Payer: Medicare Other | Source: Ambulatory Visit | Attending: Orthopaedic Surgery | Admitting: Orthopaedic Surgery

## 2018-03-02 ENCOUNTER — Encounter (HOSPITAL_COMMUNITY): Payer: Self-pay

## 2018-03-02 ENCOUNTER — Other Ambulatory Visit: Payer: Self-pay

## 2018-03-02 ENCOUNTER — Encounter (HOSPITAL_COMMUNITY)
Admission: RE | Admit: 2018-03-02 | Discharge: 2018-03-02 | Disposition: A | Discharge status: Home / Self Care | Payer: Medicare Other | Source: Ambulatory Visit | Attending: Physician Assistant | Admitting: Physician Assistant

## 2018-03-02 DIAGNOSIS — M1611 Unilateral primary osteoarthritis, right hip: Secondary | ICD-10-CM | POA: Diagnosis present

## 2018-03-02 DIAGNOSIS — Z01810 Encounter for preprocedural cardiovascular examination: Secondary | ICD-10-CM | POA: Diagnosis not present

## 2018-03-02 DIAGNOSIS — Z01812 Encounter for preprocedural laboratory examination: Secondary | ICD-10-CM | POA: Insufficient documentation

## 2018-03-02 DIAGNOSIS — Z01818 Encounter for other preprocedural examination: Secondary | ICD-10-CM | POA: Insufficient documentation

## 2018-03-02 HISTORY — DX: Complete loss of teeth, unspecified cause, unspecified class: K08.109

## 2018-03-02 HISTORY — DX: Personal history of other diseases of the digestive system: Z87.19

## 2018-03-02 HISTORY — DX: Complete loss of teeth, unspecified cause, unspecified class: Z97.2

## 2018-03-02 HISTORY — DX: Presence of spectacles and contact lenses: Z97.3

## 2018-03-02 HISTORY — DX: Unilateral primary osteoarthritis, right hip: M16.11

## 2018-03-02 LAB — COMPREHENSIVE METABOLIC PANEL
ALBUMIN: 4.1 g/dL (ref 3.5–5.0)
ALT: 11 U/L (ref 0–44)
AST: 16 U/L (ref 15–41)
Alkaline Phosphatase: 59 U/L (ref 38–126)
Anion gap: 10 (ref 5–15)
BUN: 12 mg/dL (ref 8–23)
CHLORIDE: 103 mmol/L (ref 98–111)
CO2: 29 mmol/L (ref 22–32)
CREATININE: 0.79 mg/dL (ref 0.44–1.00)
Calcium: 9.3 mg/dL (ref 8.9–10.3)
GFR calc Af Amer: >60 mL/min (ref >60)
GFR calc non Af Amer: >60 mL/min (ref >60)
GLUCOSE: 95 mg/dL (ref 70–99)
POTASSIUM: 4.1 mmol/L (ref 3.5–5.1)
Sodium: 142 mmol/L (ref 135–145)
Total Bilirubin: 0.6 mg/dL (ref 0.3–1.2)
Total Protein: 6.8 g/dL (ref 6.5–8.1)

## 2018-03-02 LAB — CBC WITH DIFFERENTIAL/PLATELET
ABS IMMATURE GRANULOCYTES: 0 10*3/uL (ref 0.0–0.1)
Basophils Absolute: 0 10*3/uL (ref 0.0–0.1)
Basophils Relative: 1 %
EOS ABS: 0.1 10*3/uL (ref 0.0–0.7)
Eosinophils Relative: 1 %
HCT: 40.5 % (ref 36.0–46.0)
Hemoglobin: 13 g/dL (ref 12.0–15.0)
IMMATURE GRANULOCYTES: 0 %
Lymphocytes Relative: 19 %
Lymphs Abs: 1.3 10*3/uL (ref 0.7–4.0)
MCH: 29 pg (ref 26.0–34.0)
MCHC: 32.1 g/dL (ref 30.0–36.0)
MCV: 90.4 fL (ref 78.0–100.0)
MONOS PCT: 6 %
Monocytes Absolute: 0.4 10*3/uL (ref 0.1–1.0)
NEUTROS ABS: 5.3 10*3/uL (ref 1.7–7.7)
NEUTROS PCT: 73 %
Platelets: 277 10*3/uL (ref 150–400)
RBC: 4.48 MIL/uL (ref 3.87–5.11)
RDW: 12.9 % (ref 11.5–15.5)
WBC: 7.2 10*3/uL (ref 4.0–10.5)

## 2018-03-02 LAB — TYPE AND SCREEN
ABO/RH(D): A POS
ANTIBODY SCREEN: NEGATIVE

## 2018-03-02 LAB — SURGICAL PCR SCREEN
MRSA, PCR: NEGATIVE
Staphylococcus aureus: NEGATIVE

## 2018-03-02 LAB — PROTIME-INR
INR: 0.95
Prothrombin Time: 12.6 seconds (ref 11.4–15.2)

## 2018-03-02 LAB — ABO/RH: ABO/RH(D): A POS

## 2018-03-02 LAB — APTT: aPTT: 26 seconds (ref 24–36)

## 2018-03-02 NOTE — Progress Notes (Signed)
Pt denies SOB, chest pain, and being under the care of a cardiologist. Pt denies having a stress test, echo and cardiac cath. Pt denies having an EKG and chest x ray within the last year. Pt denies recent labs. Pt stated that she has a spinal cord stimulator implanted near her " right hip/waist."

## 2018-03-06 ENCOUNTER — Telehealth (INDEPENDENT_AMBULATORY_CARE_PROVIDER_SITE_OTHER): Payer: Self-pay | Admitting: Orthopaedic Surgery

## 2018-03-06 NOTE — Telephone Encounter (Signed)
Patient and her sister in law Susie walked into office with questions pertaining to the surgery and after care.   1. Is the patients Rehab/Pt covered by her insurance? 2. How and What determines the rehab faculty the patient will go to after surgery? The would like [facility] if possible or [place]. 3. Will the patient be given a walker? 4. How & when will the family know where the patient will be given?

## 2018-03-08 NOTE — Telephone Encounter (Signed)
I called sister in law and discussed.  [facility] will [facility] upon discharge from the hospital.

## 2018-03-09 MED ORDER — CLINDAMYCIN PHOSPHATE 900 MG/50ML IV SOLN: 900.0000 mg | INTRAVENOUS | Status: AC

## 2018-03-09 MED ORDER — TRANEXAMIC ACID 1000 MG/10ML IV SOLN
2000.0000 mg | INTRAVENOUS | Status: AC
  Filled 2018-03-09: qty 20

## 2018-03-09 MED ORDER — TRANEXAMIC ACID 1000 MG/10ML IV SOLN
1000.0000 mg | INTRAVENOUS | Status: AC
  Filled 2018-03-09: qty 10

## 2018-03-16 ENCOUNTER — Encounter (HOSPITAL_COMMUNITY): Payer: Self-pay | Admitting: *Deleted

## 2018-03-16 NOTE — Progress Notes (Signed)
Patient aware of new surgery date and time of arrival of [id number].  Patient aware and still had pre-op instructions from PAT appointment.

## 2018-03-20 ENCOUNTER — Encounter (HOSPITAL_COMMUNITY)
Admission: RE | Disposition: A | Discharge status: Skilled Nursing Facility | Payer: Self-pay | Source: Home / Self Care | Attending: Orthopaedic Surgery

## 2018-03-20 ENCOUNTER — Inpatient Hospital Stay (HOSPITAL_COMMUNITY): Payer: Medicare Other

## 2018-03-20 ENCOUNTER — Inpatient Hospital Stay (HOSPITAL_COMMUNITY)
Admission: RE | Admit: 2018-03-20 | Discharge: 2018-03-23 | DRG: 470 | Disposition: A | Discharge status: Skilled Nursing Facility | Payer: Medicare Other | Attending: Orthopaedic Surgery | Admitting: Orthopaedic Surgery

## 2018-03-20 ENCOUNTER — Encounter (HOSPITAL_COMMUNITY): Payer: Self-pay | Admitting: Certified Registered"

## 2018-03-20 ENCOUNTER — Other Ambulatory Visit: Payer: Self-pay

## 2018-03-20 ENCOUNTER — Inpatient Hospital Stay (HOSPITAL_COMMUNITY): Payer: Medicare Other | Admitting: Certified Registered Nurse Anesthetist

## 2018-03-20 DIAGNOSIS — M5030 Other cervical disc degeneration, unspecified cervical region: Secondary | ICD-10-CM | POA: Diagnosis present

## 2018-03-20 DIAGNOSIS — K219 Gastro-esophageal reflux disease without esophagitis: Secondary | ICD-10-CM | POA: Diagnosis present

## 2018-03-20 DIAGNOSIS — D62 Acute posthemorrhagic anemia: Secondary | ICD-10-CM | POA: Diagnosis not present

## 2018-03-20 DIAGNOSIS — Z880 Allergy status to penicillin: Secondary | ICD-10-CM

## 2018-03-20 DIAGNOSIS — Z7901 Long term (current) use of anticoagulants: Secondary | ICD-10-CM | POA: Diagnosis not present

## 2018-03-20 DIAGNOSIS — R110 Nausea: Secondary | ICD-10-CM | POA: Diagnosis not present

## 2018-03-20 DIAGNOSIS — Z886 Allergy status to analgesic agent status: Secondary | ICD-10-CM

## 2018-03-20 DIAGNOSIS — Z87891 Personal history of nicotine dependence: Secondary | ICD-10-CM

## 2018-03-20 DIAGNOSIS — Z96641 Presence of right artificial hip joint: Secondary | ICD-10-CM | POA: Diagnosis not present

## 2018-03-20 DIAGNOSIS — F329 Major depressive disorder, single episode, unspecified: Secondary | ICD-10-CM | POA: Diagnosis present

## 2018-03-20 DIAGNOSIS — Z833 Family history of diabetes mellitus: Secondary | ICD-10-CM | POA: Diagnosis not present

## 2018-03-20 DIAGNOSIS — F419 Anxiety disorder, unspecified: Secondary | ICD-10-CM | POA: Diagnosis present

## 2018-03-20 DIAGNOSIS — M1611 Unilateral primary osteoarthritis, right hip: Secondary | ICD-10-CM | POA: Diagnosis present

## 2018-03-20 DIAGNOSIS — Z79891 Long term (current) use of opiate analgesic: Secondary | ICD-10-CM

## 2018-03-20 DIAGNOSIS — K449 Diaphragmatic hernia without obstruction or gangrene: Secondary | ICD-10-CM | POA: Diagnosis not present

## 2018-03-20 DIAGNOSIS — Z973 Presence of spectacles and contact lenses: Secondary | ICD-10-CM

## 2018-03-20 DIAGNOSIS — Z91013 Allergy to seafood: Secondary | ICD-10-CM | POA: Diagnosis not present

## 2018-03-20 DIAGNOSIS — R6510 Systemic inflammatory response syndrome (SIRS) of non-infectious origin without acute organ dysfunction: Secondary | ICD-10-CM | POA: Diagnosis present

## 2018-03-20 DIAGNOSIS — Z90710 Acquired absence of both cervix and uterus: Secondary | ICD-10-CM | POA: Diagnosis not present

## 2018-03-20 DIAGNOSIS — Z79899 Other long term (current) drug therapy: Secondary | ICD-10-CM

## 2018-03-20 DIAGNOSIS — Z972 Presence of dental prosthetic device (complete) (partial): Secondary | ICD-10-CM | POA: Diagnosis not present

## 2018-03-20 DIAGNOSIS — Z96649 Presence of unspecified artificial hip joint: Secondary | ICD-10-CM

## 2018-03-20 DIAGNOSIS — I8390 Asymptomatic varicose veins of unspecified lower extremity: Secondary | ICD-10-CM | POA: Diagnosis present

## 2018-03-20 DIAGNOSIS — M5136 Other intervertebral disc degeneration, lumbar region: Secondary | ICD-10-CM | POA: Diagnosis present

## 2018-03-20 DIAGNOSIS — M25551 Pain in right hip: Secondary | ICD-10-CM

## 2018-03-20 DIAGNOSIS — Z825 Family history of asthma and other chronic lower respiratory diseases: Secondary | ICD-10-CM | POA: Diagnosis not present

## 2018-03-20 HISTORY — PX: TOTAL HIP ARTHROPLASTY: SHX124

## 2018-03-20 LAB — ABO/RH: ABO/RH(D): A POS

## 2018-03-20 SURGERY — ARTHROPLASTY, HIP, TOTAL, ANTERIOR APPROACH
Anesthesia: Monitor Anesthesia Care | Site: Hip | Laterality: Right

## 2018-03-20 MED ORDER — FENTANYL CITRATE (PF) 100 MCG/2ML IJ SOLN
INTRAMUSCULAR | Status: AC
  Filled 2018-03-20: qty 2

## 2018-03-20 MED ORDER — ALUM & MAG HYDROXIDE-SIMETH 200-200-20 MG/5ML PO SUSP: 30.0000 mL | ORAL | Status: DC | PRN

## 2018-03-20 MED ORDER — KETOROLAC TROMETHAMINE 15 MG/ML IJ SOLN
30.0000 mg | Freq: Four times a day (QID) | INTRAMUSCULAR | Status: AC
  Administered 2018-03-20 – 2018-03-21 (×3): 30 mg via INTRAVENOUS
  Filled 2018-03-20 (×4): qty 2

## 2018-03-20 MED ORDER — METOCLOPRAMIDE HCL 5 MG PO TABS: 5.0000 mg | ORAL_TABLET | Freq: Three times a day (TID) | ORAL | Status: DC | PRN

## 2018-03-20 MED ORDER — METOCLOPRAMIDE HCL 5 MG/ML IJ SOLN: 5.0000 mg | Freq: Three times a day (TID) | INTRAMUSCULAR | Status: DC | PRN

## 2018-03-20 MED ORDER — CHLORHEXIDINE GLUCONATE 4 % EX LIQD: 60.0000 mL | Freq: Once | CUTANEOUS | Status: DC

## 2018-03-20 MED ORDER — SODIUM CHLORIDE 0.9 % IR SOLN
Status: DC | PRN
  Administered 2018-03-20: 1000 mL

## 2018-03-20 MED ORDER — TRAZODONE HCL 50 MG PO TABS
50.0000 mg | ORAL_TABLET | Freq: Every day | ORAL | Status: DC
  Administered 2018-03-20 – 2018-03-22 (×3): 50 mg via ORAL
  Filled 2018-03-20 (×3): qty 1

## 2018-03-20 MED ORDER — MAGNESIUM CITRATE PO SOLN: 1.0000 | Freq: Once | ORAL | Status: DC | PRN

## 2018-03-20 MED ORDER — FENTANYL CITRATE (PF) 100 MCG/2ML IJ SOLN
INTRAMUSCULAR | Status: DC | PRN
  Administered 2018-03-20 (×2): 50 ug via INTRAVENOUS
  Administered 2018-03-20: 25 ug via INTRAVENOUS
  Administered 2018-03-20: 50 ug via INTRAVENOUS
  Administered 2018-03-20: 25 ug via INTRAVENOUS

## 2018-03-20 MED ORDER — OXYCODONE HCL 5 MG PO TABS: 5.0000 mg | ORAL_TABLET | ORAL | 0 refills | Status: DC | PRN

## 2018-03-20 MED ORDER — HYDROMORPHONE HCL 1 MG/ML IJ SOLN
0.2500 mg | INTRAMUSCULAR | Status: DC | PRN
  Administered 2018-03-20: 0.25 mg via INTRAVENOUS
  Administered 2018-03-20: 0.5 mg via INTRAVENOUS
  Administered 2018-03-20: 0.25 mg via INTRAVENOUS
  Administered 2018-03-20 (×2): 0.5 mg via INTRAVENOUS

## 2018-03-20 MED ORDER — ONDANSETRON HCL 4 MG/2ML IJ SOLN
INTRAMUSCULAR | Status: DC | PRN
  Administered 2018-03-20: 4 mg via INTRAVENOUS

## 2018-03-20 MED ORDER — BUPIVACAINE IN DEXTROSE 0.75-8.25 % IT SOLN
INTRATHECAL | Status: DC | PRN
  Administered 2018-03-20: 2 mL via INTRATHECAL

## 2018-03-20 MED ORDER — MIDAZOLAM HCL 5 MG/5ML IJ SOLN
INTRAMUSCULAR | Status: DC | PRN
  Administered 2018-03-20: 2 mg via INTRAVENOUS

## 2018-03-20 MED ORDER — OXYCODONE HCL ER 10 MG PO T12A
10.0000 mg | EXTENDED_RELEASE_TABLET | Freq: Two times a day (BID) | ORAL | Status: DC
  Administered 2018-03-20 – 2018-03-23 (×6): 10 mg via ORAL
  Filled 2018-03-20 (×7): qty 1

## 2018-03-20 MED ORDER — GABAPENTIN 300 MG PO CAPS
300.0000 mg | ORAL_CAPSULE | Freq: Three times a day (TID) | ORAL | Status: DC
  Administered 2018-03-20 – 2018-03-23 (×9): 300 mg via ORAL
  Filled 2018-03-20 (×10): qty 1

## 2018-03-20 MED ORDER — DIPHENHYDRAMINE HCL 12.5 MG/5ML PO ELIX: 25.0000 mg | ORAL_SOLUTION | ORAL | Status: DC | PRN

## 2018-03-20 MED ORDER — 0.9 % SODIUM CHLORIDE (POUR BTL) OPTIME
TOPICAL | Status: DC | PRN
  Administered 2018-03-20: 1000 mL

## 2018-03-20 MED ORDER — CEFAZOLIN SODIUM-DEXTROSE 2-4 GM/100ML-% IV SOLN
INTRAVENOUS | Status: AC
  Filled 2018-03-20: qty 100

## 2018-03-20 MED ORDER — TRANEXAMIC ACID 1000 MG/10ML IV SOLN
1000.0000 mg | Freq: Once | INTRAVENOUS | Status: AC
  Administered 2018-03-20: 1000 mg via INTRAVENOUS
  Filled 2018-03-20: qty 1100

## 2018-03-20 MED ORDER — RIVAROXABAN 10 MG PO TABS
10.0000 mg | ORAL_TABLET | Freq: Every day | ORAL | Status: DC
  Administered 2018-03-21 – 2018-03-23 (×3): 10 mg via ORAL
  Filled 2018-03-20 (×3): qty 1

## 2018-03-20 MED ORDER — STERILE WATER FOR IRRIGATION IR SOLN
Status: DC | PRN
  Administered 2018-03-20: 2000 mL

## 2018-03-20 MED ORDER — LACTATED RINGERS IV SOLN
INTRAVENOUS | Status: DC
  Administered 2018-03-20 (×2): via INTRAVENOUS

## 2018-03-20 MED ORDER — RIVAROXABAN 10 MG PO TABS: 10.0000 mg | ORAL_TABLET | Freq: Every day | ORAL | 0 refills | Status: DC

## 2018-03-20 MED ORDER — SODIUM CHLORIDE 0.9 % IV SOLN
INTRAVENOUS | Status: DC
  Administered 2018-03-20: 1000 mL via INTRAVENOUS

## 2018-03-20 MED ORDER — METHOCARBAMOL 500 MG PO TABS
500.0000 mg | ORAL_TABLET | Freq: Four times a day (QID) | ORAL | Status: DC | PRN
  Administered 2018-03-22 – 2018-03-23 (×2): 500 mg via ORAL
  Filled 2018-03-20 (×3): qty 1

## 2018-03-20 MED ORDER — ACETAMINOPHEN 500 MG PO TABS
1000.0000 mg | ORAL_TABLET | Freq: Four times a day (QID) | ORAL | Status: AC
  Administered 2018-03-20 – 2018-03-21 (×4): 1000 mg via ORAL
  Filled 2018-03-20 (×4): qty 2

## 2018-03-20 MED ORDER — MENTHOL 3 MG MT LOZG: 1.0000 | LOZENGE | OROMUCOSAL | Status: DC | PRN

## 2018-03-20 MED ORDER — PROMETHAZINE HCL 25 MG/ML IJ SOLN
INTRAMUSCULAR | Status: AC
  Administered 2018-03-20: 6.25 mg via INTRAVENOUS
  Filled 2018-03-20: qty 1

## 2018-03-20 MED ORDER — OXYCODONE HCL ER 10 MG PO T12A: 10.0000 mg | EXTENDED_RELEASE_TABLET | Freq: Two times a day (BID) | ORAL | 0 refills | Status: DC

## 2018-03-20 MED ORDER — CEFAZOLIN SODIUM-DEXTROSE 2-3 GM-%(50ML) IV SOLR
INTRAVENOUS | Status: DC | PRN
  Administered 2018-03-20: 2 g via INTRAVENOUS

## 2018-03-20 MED ORDER — SORBITOL 70 % SOLN
30.0000 mL | Freq: Every day | Status: DC | PRN
Start: 2018-03-20 — End: 2018-03-23
  Filled 2018-03-20: qty 30

## 2018-03-20 MED ORDER — ONDANSETRON HCL 4 MG PO TABS: 4.0000 mg | ORAL_TABLET | Freq: Three times a day (TID) | ORAL | 0 refills | Status: DC | PRN

## 2018-03-20 MED ORDER — TRANEXAMIC ACID 1000 MG/10ML IV SOLN
2000.0000 mg | Freq: Once | INTRAVENOUS | Status: AC
  Administered 2018-03-20: 2000 mg via TOPICAL
  Filled 2018-03-20 (×2): qty 20

## 2018-03-20 MED ORDER — DEXAMETHASONE SODIUM PHOSPHATE 10 MG/ML IJ SOLN
10.0000 mg | Freq: Once | INTRAMUSCULAR | Status: AC
  Administered 2018-03-21: 10 mg via INTRAVENOUS
  Filled 2018-03-20: qty 1

## 2018-03-20 MED ORDER — VANCOMYCIN HCL 1000 MG IV SOLR
INTRAVENOUS | Status: DC | PRN
  Administered 2018-03-20: 1000 mg via TOPICAL

## 2018-03-20 MED ORDER — SENNOSIDES-DOCUSATE SODIUM 8.6-50 MG PO TABS: 1.0000 | ORAL_TABLET | Freq: Every evening | ORAL | 1 refills | Status: DC | PRN

## 2018-03-20 MED ORDER — PROPOFOL 10 MG/ML IV BOLUS
INTRAVENOUS | Status: AC
  Filled 2018-03-20: qty 40

## 2018-03-20 MED ORDER — PROPOFOL 10 MG/ML IV BOLUS
INTRAVENOUS | Status: AC
  Filled 2018-03-20: qty 20

## 2018-03-20 MED ORDER — PHENYLEPHRINE 40 MCG/ML (10ML) SYRINGE FOR IV PUSH (FOR BLOOD PRESSURE SUPPORT)
PREFILLED_SYRINGE | INTRAVENOUS | Status: AC
  Filled 2018-03-20: qty 10

## 2018-03-20 MED ORDER — TRANEXAMIC ACID 1000 MG/10ML IV SOLN
1000.0000 mg | INTRAVENOUS | Status: AC
  Administered 2018-03-20: 1000 mg via INTRAVENOUS
  Filled 2018-03-20: qty 10
  Filled 2018-03-20: qty 1100

## 2018-03-20 MED ORDER — PROPOFOL 500 MG/50ML IV EMUL
INTRAVENOUS | Status: DC | PRN
  Administered 2018-03-20: 75 ug/kg/min via INTRAVENOUS

## 2018-03-20 MED ORDER — PHENOL 1.4 % MT LIQD
1.0000 | OROMUCOSAL | Status: DC | PRN
  Filled 2018-03-20: qty 177

## 2018-03-20 MED ORDER — PROMETHAZINE HCL 25 MG/ML IJ SOLN
6.2500 mg | INTRAMUSCULAR | Status: AC | PRN
  Administered 2018-03-20 (×2): 6.25 mg via INTRAVENOUS

## 2018-03-20 MED ORDER — CEFAZOLIN SODIUM-DEXTROSE 2-4 GM/100ML-% IV SOLN
2.0000 g | Freq: Four times a day (QID) | INTRAVENOUS | Status: AC
  Administered 2018-03-20 – 2018-03-21 (×3): 2 g via INTRAVENOUS
  Filled 2018-03-20 (×3): qty 100

## 2018-03-20 MED ORDER — MIDAZOLAM HCL 2 MG/2ML IJ SOLN
INTRAMUSCULAR | Status: AC
  Filled 2018-03-20: qty 2

## 2018-03-20 MED ORDER — OXYCODONE HCL 5 MG PO TABS
10.0000 mg | ORAL_TABLET | ORAL | Status: DC | PRN
  Administered 2018-03-20: 10 mg via ORAL
  Administered 2018-03-21 – 2018-03-23 (×5): 15 mg via ORAL
  Filled 2018-03-20 (×5): qty 3
  Filled 2018-03-20: qty 2

## 2018-03-20 MED ORDER — HYDROMORPHONE HCL 1 MG/ML IJ SOLN
0.5000 mg | INTRAMUSCULAR | Status: DC | PRN
  Administered 2018-03-22: 1 mg via INTRAVENOUS
  Filled 2018-03-20: qty 1

## 2018-03-20 MED ORDER — ONDANSETRON HCL 4 MG PO TABS
4.0000 mg | ORAL_TABLET | Freq: Four times a day (QID) | ORAL | Status: DC | PRN
  Administered 2018-03-23: 4 mg via ORAL
  Filled 2018-03-20: qty 1

## 2018-03-20 MED ORDER — PHENYLEPHRINE 40 MCG/ML (10ML) SYRINGE FOR IV PUSH (FOR BLOOD PRESSURE SUPPORT)
PREFILLED_SYRINGE | INTRAVENOUS | Status: DC | PRN
  Administered 2018-03-20 (×3): 80 ug via INTRAVENOUS

## 2018-03-20 MED ORDER — METHOCARBAMOL 750 MG PO TABS: 750.0000 mg | ORAL_TABLET | Freq: Two times a day (BID) | ORAL | 0 refills | Status: DC | PRN

## 2018-03-20 MED ORDER — PROPOFOL 10 MG/ML IV BOLUS: INTRAVENOUS | Status: DC | PRN

## 2018-03-20 MED ORDER — ONDANSETRON HCL 4 MG/2ML IJ SOLN: 4.0000 mg | Freq: Four times a day (QID) | INTRAMUSCULAR | Status: DC | PRN

## 2018-03-20 MED ORDER — MIDAZOLAM HCL 2 MG/2ML IJ SOLN: 0.5000 mg | Freq: Once | INTRAMUSCULAR | Status: DC

## 2018-03-20 MED ORDER — DEXAMETHASONE SODIUM PHOSPHATE 10 MG/ML IJ SOLN
INTRAMUSCULAR | Status: DC | PRN
  Administered 2018-03-20: 10 mg via INTRAVENOUS

## 2018-03-20 MED ORDER — VANCOMYCIN HCL 1000 MG IV SOLR
INTRAVENOUS | Status: AC
  Filled 2018-03-20: qty 1000

## 2018-03-20 MED ORDER — ISOPROPYL ALCOHOL 70 % SOLN
Status: AC
  Filled 2018-03-20: qty 480

## 2018-03-20 MED ORDER — HYDROMORPHONE HCL 1 MG/ML IJ SOLN
INTRAMUSCULAR | Status: AC
  Administered 2018-03-20: 0.25 mg via INTRAVENOUS
  Filled 2018-03-20: qty 2

## 2018-03-20 MED ORDER — ACETAMINOPHEN 325 MG PO TABS
325.0000 mg | ORAL_TABLET | Freq: Four times a day (QID) | ORAL | Status: DC | PRN
  Administered 2018-03-23: 650 mg via ORAL
  Filled 2018-03-20: qty 2

## 2018-03-20 MED ORDER — DEXAMETHASONE SODIUM PHOSPHATE 10 MG/ML IJ SOLN
INTRAMUSCULAR | Status: AC
  Filled 2018-03-20: qty 1

## 2018-03-20 MED ORDER — ONDANSETRON HCL 4 MG/2ML IJ SOLN
INTRAMUSCULAR | Status: AC
  Filled 2018-03-20: qty 2

## 2018-03-20 MED ORDER — OXYCODONE HCL 5 MG PO TABS
5.0000 mg | ORAL_TABLET | ORAL | Status: DC | PRN
  Administered 2018-03-20 – 2018-03-21 (×4): 5 mg via ORAL
  Administered 2018-03-21 – 2018-03-22 (×2): 10 mg via ORAL
  Filled 2018-03-20: qty 2
  Filled 2018-03-20 (×2): qty 1
  Filled 2018-03-20 (×3): qty 2

## 2018-03-20 MED ORDER — PROMETHAZINE HCL 25 MG PO TABS: 25.0000 mg | ORAL_TABLET | Freq: Four times a day (QID) | ORAL | 1 refills | Status: DC | PRN

## 2018-03-20 MED ORDER — PROPOFOL 10 MG/ML IV BOLUS
INTRAVENOUS | Status: DC | PRN
  Administered 2018-03-20: 20 mg via INTRAVENOUS

## 2018-03-20 MED ORDER — POLYETHYLENE GLYCOL 3350 17 G PO PACK
17.0000 g | PACK | Freq: Every day | ORAL | Status: DC | PRN
  Administered 2018-03-21: 17 g via ORAL
  Filled 2018-03-20 (×2): qty 1

## 2018-03-20 MED ORDER — METHOCARBAMOL 1000 MG/10ML IJ SOLN
500.0000 mg | Freq: Four times a day (QID) | INTRAVENOUS | Status: DC | PRN
  Administered 2018-03-20: 500 mg via INTRAVENOUS
  Filled 2018-03-20: qty 550

## 2018-03-20 MED ORDER — DOCUSATE SODIUM 100 MG PO CAPS
100.0000 mg | ORAL_CAPSULE | Freq: Two times a day (BID) | ORAL | Status: DC
  Administered 2018-03-20 – 2018-03-23 (×6): 100 mg via ORAL
  Filled 2018-03-20 (×6): qty 1

## 2018-03-20 SURGICAL SUPPLY — 36 items
BAG ZIPLOCK 12X15 (MISCELLANEOUS) ×2 IMPLANT
BLADE SAW SGTL 18X1.27X75 (BLADE) IMPLANT
CAPT HIP TOTAL 2 ×2 IMPLANT
COVER PERINEAL POST (MISCELLANEOUS) ×2 IMPLANT
COVER SURGICAL LIGHT HANDLE (MISCELLANEOUS) ×2 IMPLANT
DERMABOND ADVANCED (GAUZE/BANDAGES/DRESSINGS)
DERMABOND ADVANCED .7 DNX12 (GAUZE/BANDAGES/DRESSINGS) IMPLANT
DRAPE STERI IOBAN 125X83 (DRAPES) ×2 IMPLANT
DRAPE U-SHAPE 47X51 STRL (DRAPES) ×4 IMPLANT
DRSG AQUACEL AG ADV 3.5X10 (GAUZE/BANDAGES/DRESSINGS) ×2 IMPLANT
DURAPREP 26ML APPLICATOR (WOUND CARE) ×2 IMPLANT
ELECT BLADE TIP CTD 4 INCH (ELECTRODE) ×2 IMPLANT
ELECT REM PT RETURN 15FT ADLT (MISCELLANEOUS) ×2 IMPLANT
GLOVE SURG SS PI 7.5 STRL IVOR (GLOVE) ×4 IMPLANT
GOWN STRL REUS W/TWL LRG LVL3 (GOWN DISPOSABLE) ×2 IMPLANT
HOOD PEEL AWAY FLYTE STAYCOOL (MISCELLANEOUS) ×6 IMPLANT
KIT BASIN OR (CUSTOM PROCEDURE TRAY) IMPLANT
KIT PREP HIP W/CEMENT RESTRICT (KITS) ×1 IMPLANT
KIT PREPARATION TOTAL HIP (KITS) ×1
MARKER SKIN DUAL TIP RULER LAB (MISCELLANEOUS) ×2 IMPLANT
PACK TOTAL JOINT WL LF (CUSTOM PROCEDURE TRAY) IMPLANT
PACK UNIVERSAL I (CUSTOM PROCEDURE TRAY) ×2 IMPLANT
STRIP CLOSURE SKIN 1/2X4 (GAUZE/BANDAGES/DRESSINGS) ×2 IMPLANT
SUT ETHIBOND #5 BRAIDED 30INL (SUTURE) ×2 IMPLANT
SUT ETHIBOND 2 (SUTURE) IMPLANT
SUT MNCRL AB 4-0 PS2 18 (SUTURE) ×2 IMPLANT
SUT VIC AB 0 CT1 36 (SUTURE) IMPLANT
SUT VIC AB 1 CT1 27 (SUTURE) ×1
SUT VIC AB 1 CT1 27XBRD ANTBC (SUTURE) ×1 IMPLANT
SUT VIC AB 2-0 CT1 27 (SUTURE) ×2
SUT VIC AB 2-0 CT1 TAPERPNT 27 (SUTURE) ×2 IMPLANT
TOWEL OR 17X26 10 PK STRL BLUE (TOWEL DISPOSABLE) ×2 IMPLANT
TOWEL OR NON WOVEN STRL DISP B (DISPOSABLE) IMPLANT
TRAY FOLEY CATH 14FRSI W/METER (CATHETERS) ×2 IMPLANT
TRAY FOLEY MTR SLVR 16FR STAT (SET/KITS/TRAYS/PACK) IMPLANT
YANKAUER SUCT BULB TIP 10FT TU (MISCELLANEOUS) ×2 IMPLANT

## 2018-03-20 NOTE — Discharge Instructions (Signed)
INSTRUCTIONS AFTER JOINT REPLACEMENT  ° °o Remove items at home which could result in a fall. This includes throw rugs or furniture in walking pathways °o ICE to the affected joint every three hours while awake for 30 minutes at a time, for at least the first 3-5 days, and then as needed for pain and swelling.  Continue to use ice for pain and swelling. You may notice swelling that will progress down to the foot and ankle.  This is normal after surgery.  Elevate your leg when you are not up walking on it.   °o Continue to use the breathing machine you got in the hospital (incentive spirometer) which will help keep your temperature down.  It is common for your temperature to cycle up and down following surgery, especially at night when you are not up moving around and exerting yourself.  The breathing machine keeps your lungs expanded and your temperature down. ° ° °DIET:  As you were doing prior to hospitalization, we recommend a well-balanced diet. ° °DRESSING / WOUND CARE / SHOWERING ° °You may change your surgical dressing 7 days after surgery.  Then change the dressing every day with sterile gauze.  Please use good hand washing techniques before changing the dressing.  Do not use any lotions or creams on the incision until instructed by your surgeon.  You may shower while you have the surgical dressing which is waterproof.  After removal of surgical dressing, you must cover the incision when showering. ° °ACTIVITY ° °o Increase activity slowly as tolerated, but follow the weight bearing instructions below.   °o No driving for 6 weeks or until further direction given by your physician.  You cannot drive while taking narcotics.  °o No lifting or carrying greater than 10 lbs. until further directed by your surgeon. °o Avoid periods of inactivity such as sitting longer than an hour when not asleep. This helps prevent blood clots.  °o You may return to work once you are authorized by your doctor.  ° ° ° °WEIGHT  BEARING  ° °Weight bearing as tolerated with assist device (walker, cane, etc) as directed, use it as long as suggested by your surgeon or therapist, typically at least 4-6 weeks. ° ° °EXERCISES ° °Results after joint replacement surgery are often greatly improved when you follow the exercise, range of motion and muscle strengthening exercises prescribed by your doctor. Safety measures are also important to protect the joint from further injury. Any time any of these exercises cause you to have increased pain or swelling, decrease what you are doing until you are comfortable again and then slowly increase them. If you have problems or questions, call your caregiver or physical therapist for advice.  ° °Rehabilitation is important following a joint replacement. After just a few days of immobilization, the muscles of the leg can become weakened and shrink (atrophy).  These exercises are designed to build up the tone and strength of the thigh and leg muscles and to improve motion. Often times heat used for twenty to thirty minutes before working out will loosen up your tissues and help with improving the range of motion but do not use heat for the first two weeks following surgery (sometimes heat can increase post-operative swelling).  ° °These exercises can be done on a training (exercise) mat, on the floor, on a table or on a bed. Use whatever works the best and is most comfortable for you.    Use music or television while   you are exercising so that the exercises are a pleasant break in your day. This will make your life better with the exercises acting as a break in your routine that you can look forward to.   Perform all exercises about fifteen times, three times per day or as directed.  You should exercise both the operative leg and the other leg as well. ° °Exercises include: °  °• Quad Sets - Tighten up the muscle on the front of the thigh (Quad) and hold for 5-10 seconds.   °• Straight Leg Raises - With your  knee straight (if you were given a brace, keep it on), lift the leg to 60 degrees, hold for 3 seconds, and slowly lower the leg.  Perform this exercise against resistance later as your leg gets stronger.  °• Leg Slides: Lying on your back, slowly slide your foot toward your buttocks, bending your knee up off the floor (only go as far as is comfortable). Then slowly slide your foot back down until your leg is flat on the floor again.  °• Angel Wings: Lying on your back spread your legs to the side as far apart as you can without causing discomfort.  °• Hamstring Strength:  Lying on your back, push your heel against the floor with your leg straight by tightening up the muscles of your buttocks.  Repeat, but this time bend your knee to a comfortable angle, and push your heel against the floor.  You may put a pillow under the heel to make it more comfortable if necessary.  ° °A rehabilitation program following joint replacement surgery can speed recovery and prevent re-injury in the future due to weakened muscles. Contact your doctor or a physical therapist for more information on knee rehabilitation.  ° ° °CONSTIPATION ° °Constipation is defined medically as fewer than three stools per week and severe constipation as less than one stool per week.  Even if you have a regular bowel pattern at home, your normal regimen is likely to be disrupted due to multiple reasons following surgery.  Combination of anesthesia, postoperative narcotics, change in appetite and fluid intake all can affect your bowels.  ° °YOU MUST use at least one of the following options; they are listed in order of increasing strength to get the job done.  They are all available over the counter, and you may need to use some, POSSIBLY even all of these options:   ° °Drink plenty of fluids (prune juice may be helpful) and high fiber foods °Colace 100 mg by mouth twice a day  °Senokot for constipation as directed and as needed Dulcolax (bisacodyl), take  with full glass of water  °Miralax (polyethylene glycol) once or twice a day as needed. ° °If you have tried all these things and are unable to have a bowel movement in the first 3-4 days after surgery call either your surgeon or your primary doctor.   ° °If you experience loose stools or diarrhea, hold the medications until you stool forms back up.  If your symptoms do not get better within 1 week or if they get worse, check with your doctor.  If you experience "the worst abdominal pain ever" or develop nausea or vomiting, please contact the office immediately for further recommendations for treatment. ° ° °ITCHING:  If you experience itching with your medications, try taking only a single pain pill, or even half a pain pill at a time.  You can also use Benadryl over the counter   for itching or also to help with sleep.  ° °TED HOSE STOCKINGS:  Use stockings on both legs until for at least 2 weeks or as directed by physician office. They may be removed at night for sleeping. ° °MEDICATIONS:  See your medication summary on the “After Visit Summary” that nursing will review with you.  You may have some home medications which will be placed on hold until you complete the course of blood thinner medication.  It is important for you to complete the blood thinner medication as prescribed. ° °PRECAUTIONS:  If you experience chest pain or shortness of breath - call 911 immediately for transfer to the hospital emergency department.  ° °If you develop a fever greater that 101 F, purulent drainage from wound, increased redness or drainage from wound, foul odor from the wound/dressing, or calf pain - CONTACT YOUR SURGEON.   °                                                °FOLLOW-UP APPOINTMENTS:  If you do not already have a post-op appointment, please call the office for an appointment to be seen by your surgeon.  Guidelines for how soon to be seen are listed in your “After Visit Summary”, but are typically between 1-4 weeks  after surgery. ° °OTHER INSTRUCTIONS:  ° °Knee Replacement:  Do not place pillow under knee, focus on keeping the knee straight while resting. CPM instructions: 0-90 degrees, 2 hours in the morning, 2 hours in the afternoon, and 2 hours in the evening. Place foam block, curve side up under heel at all times except when in CPM or when walking.  DO NOT modify, tear, cut, or change the foam block in any way. ° °MAKE SURE YOU:  °• Understand these instructions.  °• Get help right away if you are not doing well or get worse.  ° ° °Thank you for letting us be a part of your medical care team.  It is a privilege we respect greatly.  We hope these instructions will help you stay on track for a fast and full recovery!  ° ° °Information on my medicine - XARELTO® (Rivaroxaban) ° °Why was Xarelto® prescribed for you? °Xarelto® was prescribed for you to reduce the risk of blood clots forming after orthopedic surgery. The medical term for these abnormal blood clots is venous thromboembolism (VTE). ° °What do you need to know about xarelto® ? °Take your Xarelto® ONCE DAILY at the same time every day. °You may take it either with or without food. ° °If you have difficulty swallowing the tablet whole, you may crush it and mix in applesauce just prior to taking your dose. ° °Take Xarelto® exactly as prescribed by your doctor and DO NOT stop taking Xarelto® without talking to the doctor who prescribed the medication.  Stopping without other VTE prevention medication to take the place of Xarelto® may increase your risk of developing a clot. ° °After discharge, you should have regular check-up appointments with your healthcare provider that is prescribing your Xarelto®.   ° °What do you do if you miss a dose? °If you miss a dose, take it as soon as you remember on the same day then continue your regularly scheduled once daily regimen the next day. Do not take two doses of Xarelto® on the same day.  ° °  Important Safety Information °A  possible side effect of Xarelto® is bleeding. You should call your healthcare provider right away if you experience any of the following: °? Bleeding from an injury or your nose that does not stop. °? Unusual colored urine (red or dark brown) or unusual colored stools (red or black). °? Unusual bruising for unknown reasons. °? A serious fall or if you hit your head (even if there is no bleeding). ° °Some medicines may interact with Xarelto® and might increase your risk of bleeding while on Xarelto®. To help avoid this, consult your healthcare provider or pharmacist prior to using any new prescription or non-prescription medications, including herbals, vitamins, non-steroidal anti-inflammatory drugs (NSAIDs) and supplements. ° °This website has more information on Xarelto®: www.xarelto.com. ° ° ° ° ° °

## 2018-03-20 NOTE — Anesthesia Postprocedure Evaluation (Signed)
Anesthesia Post Note  Patient: Anne Terrell  Procedure(s) Performed: RIGHT TOTAL HIP ARTHROPLASTY ANTERIOR APPROACH (Right Hip)     Patient location during evaluation: PACU Anesthesia Type: MAC and Spinal Level of consciousness: awake and alert Pain management: pain level controlled Vital Signs Assessment: post-procedure vital signs reviewed and stable Respiratory status: spontaneous breathing and respiratory function stable Cardiovascular status: blood pressure returned to baseline and stable Postop Assessment: spinal receding Anesthetic complications: no    Last Vitals:  Vitals:   03/20/18 1400 03/20/18 1420  BP: (!) 105/50 (!) 115/52  Pulse: 66 62  Resp: 12 16  Temp:  36.6 C  SpO2: 100% 99%    Last Pain:  Vitals:   03/20/18 1420  TempSrc: Oral  PainSc:                  Anne Terrell DANIEL

## 2018-03-20 NOTE — Progress Notes (Signed)
PT Cancellation Note  Patient Details Name: Anne Terrell MRN: [id number] DOB: 1941/01/18   Cancelled Treatment:    Reason Eval/Treat Not Completed: Pain limiting ability to participate, per RN.   [name], [name] [name] 03/20/2018, 6:01 PM Blanchard KelchKaren Saory Carriero PT [phone]

## 2018-03-20 NOTE — Anesthesia Preprocedure Evaluation (Signed)
Anesthesia Evaluation  Patient identified by MRN, date of birth, ID band Patient awake    Reviewed: Allergy & Precautions, NPO status , Patient's Chart, lab work & pertinent test results  Airway Mallampati: II  TM Distance: >3 FB Neck ROM: Full    Dental  (+) Upper Dentures, Lower Dentures, Dental Advisory Given   Pulmonary neg pulmonary ROS, former smoker,    Pulmonary exam normal        Cardiovascular + Peripheral Vascular Disease  Normal cardiovascular exam     Neuro/Psych PSYCHIATRIC DISORDERS Anxiety Depression negative neurological ROS  negative psych ROS   GI/Hepatic Neg liver ROS, hiatal hernia, GERD  ,  Endo/Other  negative endocrine ROS  Renal/GU negative Renal ROS  negative genitourinary   Musculoskeletal negative musculoskeletal ROS (+)   Abdominal   Peds negative pediatric ROS (+)  Hematology negative hematology ROS (+)   Anesthesia Other Findings   Reproductive/Obstetrics negative OB ROS                             Anesthesia Physical Anesthesia Plan  ASA: III  Anesthesia Plan: Spinal and MAC   Post-op Pain Management:    Induction:   PONV Risk Score and Plan: 2 and Ondansetron and Propofol infusion  Airway Management Planned: Natural Airway  Additional Equipment:   Intra-op Plan:   Post-operative Plan:   Informed Consent: I have reviewed the patients History and Physical, chart, labs and discussed the procedure including the risks, benefits and alternatives for the proposed anesthesia with the patient or authorized representative who has indicated his/her understanding and acceptance.   Dental advisory given  Plan Discussed with: CRNA and Anesthesiologist  Anesthesia Plan Comments:         Anesthesia Quick Evaluation

## 2018-03-20 NOTE — H&P (Signed)
PREOPERATIVE H&P  Chief Complaint: right hip degenerative joint disease  HPI: Anne Terrell is a 77 y.o. female who presents for surgical treatment of right hip degenerative joint disease.  She denies any changes in medical history.  Past Medical History:  Diagnosis Date  . Anxiety   . Arthralgia   . Carpal tunnel syndrome   . DDD (degenerative disc disease), cervical   . DDD (degenerative disc disease), lumbar   . Depression   . Full dentures   . GERD (gastroesophageal reflux disease)   . History of hiatal hernia   . Migraine headache   . Myalgia   . Myofascial pain   . Osteoarthritis   . Primary localized osteoarthritis of right hip   . Right rotator cuff tear   . Spondylosis   . Varicose veins   . Wears glasses    Past Surgical History:  Procedure Laterality Date  . ABDOMINAL HYSTERECTOMY  1988  . APPENDECTOMY    . CARPAL TUNNEL RELEASE Bilateral   . COLONOSCOPY WITH ESOPHAGOGASTRODUODENOSCOPY (EGD)    . dental posts     holds bottom dentures in place  . DILATION AND CURETTAGE OF UTERUS    . FRACTURE SURGERY Left    LLE  . MULTIPLE TOOTH EXTRACTIONS    . SPINAL CORD STIMULATOR INSERTION     located near the right hip and waist area  . TONSILLECTOMY     Social History   Socioeconomic History  . Marital status: Widowed    Spouse name: Not on file  . Number of children: 0  . Years of education: Not on file  . Highest education level: Some college, no degree  Occupational History  . Not on file  Social Needs  . Financial resource strain: Not on file  . Food insecurity:    Worry: Not on file    Inability: Not on file  . Transportation needs:    Medical: Not on file    Non-medical: Not on file  Tobacco Use  . Smoking status: Former Smoker    Types: Cigarettes    Last attempt to quit: 05/03/2004    Years since quitting: 13.8  . Smokeless tobacco: Never Used  Substance and Sexual Activity  . Alcohol use: No    Alcohol/week: 0.0 oz  . Drug use:  No  . Sexual activity: Never  Lifestyle  . Physical activity:    Days per week: Not on file    Minutes per session: Not on file  . Stress: Not on file  Relationships  . Social connections:    Talks on phone: Not on file    Gets together: Not on file    Attends religious service: Not on file    Active member of club or organization: Not on file    Attends meetings of clubs or organizations: Not on file    Relationship status: Not on file  Other Topics Concern  . Not on file  Social History Narrative   Lives at home alone   Right handed   2 cups of caffeine daily   Family History  Problem Relation Age of Onset  . Diabetes Mother   . Varicose Veins Mother   . Asthma Mother   . Heart defect Sister    Allergies  Allergen Reactions  . Penicillins Other (See Comments)    PATIENT HAS HAD A PCN REACTION WITH IMMEDIATE RASH, FACIAL/TONGUE/THROAT SWELLING, SOB, OR LIGHTHEADEDNESS WITH HYPOTENSION:  #  #  YES  #  #  Has patient had a PCN reaction causing severe rash involving mucus membranes or skin necrosis: No Has patient had a PCN reaction that required hospitalization: No Has patient had a PCN reaction occurring within the last 10 years: No If all of the above answers are "NO", then may proceed with Cephalosporin use.  . Shellfish Allergy Nausea And Vomiting and Other (See Comments)    Esophageal spasms and vomiting per pt  . Aspirin Other (See Comments)    "Stomach pain"   Prior to Admission medications   Medication Sig Start Date End Date Taking? Authorizing Provider  fentaNYL (DURAGESIC - DOSED MCG/HR) 50 MCG/HR Place 50 mcg onto the skin every 3 (three) days.   Yes [provider]  NARCAN 4 MG/0.1ML LIQD nasal spray kit Place 1 spray into the nose once as needed (overdose).  06/06/17  Yes [provider]  oxyCODONE (OXY IR/ROXICODONE) 5 MG immediate release tablet Take 5 mg by mouth every 8 (eight) hours as needed for severe pain (tid prn).   Yes [provider]  traZODone (DESYREL) 50 MG tablet TAKE 1 TABLET AT BEDTIME 11/02/17  Yes Plovsky, Berneta Sages, MD  venlafaxine XR (EFFEXOR-XR) 37.5 MG 24 hr capsule 1  qam  For  2  weeks Patient not taking: Reported on 02/28/2018 01/06/18   Norma Fredrickson, MD     Positive ROS: All other systems have been reviewed and were otherwise negative with the exception of those mentioned in the HPI and as above.  Physical Exam: General: Alert, no acute distress Cardiovascular: No pedal edema Respiratory: No cyanosis, no use of accessory musculature GI: abdomen soft Skin: No lesions in the area of chief complaint Neurologic: Sensation intact distally Psychiatric: Patient is competent for consent with normal mood and affect Lymphatic: no lymphedema  MUSCULOSKELETAL: exam stable  Assessment: right hip degenerative joint disease  Plan: Plan for Procedure(s): RIGHT TOTAL HIP ARTHROPLASTY ANTERIOR APPROACH  The risks benefits and alternatives were discussed with the patient including but not limited to the risks of nonoperative treatment, versus surgical intervention including infection, bleeding, nerve injury,  blood clots, cardiopulmonary complications, morbidity, mortality, among others, and they were willing to proceed.   Preoperative templating of the joint replacement has been completed, documented, and submitted to the Operating Room personnel in order to optimize intra-operative equipment management.  Eduard Roux, MD   03/20/2018 9:24 AM

## 2018-03-20 NOTE — Op Note (Signed)
RIGHT TOTAL HIP ARTHROPLASTY ANTERIOR APPROACH  Procedure Note Anne Terrell   [id number]  Pre-op Diagnosis: right hip degenerative joint disease     Post-op Diagnosis: same   Operative Procedures  1. Total hip replacement; Right hip; uncemented [id number]   Personnel  Surgeon(s): Tarry Kos, MD  Assist: Oneal Grout, PA-C; necessary for the timely completion of procedure and due to complexity of procedure.   Anesthesia: spinal  Prosthesis: Depuy Acetabulum: Pinnacle 50 mm Femur: Actis 3 Std Head: 32 mm size: +5 Liner: +4 neutral Bearing Type: metal on poly  Total Hip Arthroplasty (Anterior Approach) Op Note:  After informed consent was obtained and the operative extremity marked in the holding area, the patient was brought back to the operating room and placed supine on the HANA table. Next, the operative extremity was prepped and draped in normal sterile fashion. Surgical timeout occurred verifying patient identification, surgical site, surgical procedure and administration of antibiotics.  A modified anterior Smith-Peterson approach to the hip was performed, using the interval between tensor fascia lata and sartorius.  Dissection was carried bluntly down onto the anterior hip capsule. The lateral femoral circumflex vessels were identified and coagulated. A capsulotomy was performed and the capsular flaps tagged for later repair.  Fluoroscopy was utilized to prepare for the femoral neck cut. The neck osteotomy was performed. The femoral head was removed, the acetabular rim was cleared of soft tissue and attention was turned to reaming the acetabulum.  Sequential reaming was performed under fluoroscopic guidance. We reamed to a size 49 mm, and then impacted the acetabular shell. The liner was then placed after irrigation and attention turned to the femur.  After placing the femoral hook, the leg was taken to externally rotated, extended and adducted position taking care  to perform soft tissue releases to allow for adequate mobilization of the femur. Soft tissue was cleared from the shoulder of the greater trochanter and the hook elevator used to improve exposure of the proximal femur. Sequential broaching performed up to a size 3. Trial neck and head were placed. The leg was brought back up to neutral and the construct reduced. The position and sizing of components, offset and leg lengths were checked using fluoroscopy. Stability of the  construct was checked in extension and external rotation without any subluxation or impingement of prosthesis. We dislocated the prosthesis, dropped the leg back into position, removed trial components, and irrigated copiously. The final stem and head was then placed, the leg brought back up, the system reduced and fluoroscopy used to verify positioning.  We irrigated, obtained hemostasis and closed the capsule using #2 ethibond suture.  One gram of vancomycin powder was placed in the surgical bed. The fascia was closed with #1 vicryl plus, the deep fat layer was closed with 0 vicryl, the subcutaneous layers closed with 2.0 Vicryl Plus and the skin closed with 3.0 monocryl and steri strips. A sterile dressing was applied. The patient was awakened in the operating room and taken to recovery in stable condition.  All sponge, needle, and instrument counts were correct at the end of the case.   Position: supine  Complications: none.  Time Out: performed   Drains/Packing: none  Estimated blood loss: 150 cc  Returned to Recovery Room: in good condition.   Antibiotics: yes   Mechanical VTE (DVT) Prophylaxis: sequential compression devices, TED thigh-high  Chemical VTE (DVT) Prophylaxis: aspirin   Fluid Replacement: see anesthesia record  Specimens Removed: 1 to pathology  Sponge and Instrument Count Correct? yes   PACU: portable radiograph - low AP   Admission: inpatient status  Plan/RTC: Return in 2 weeks for staple  removal. Weight Bearing/Load Lower Extremity: full  Hip precautions: none Suture Removal: 10-14 days  Betadine to incision twice daily once dressing is removed on POD#7  N. Glee ArvinMichael Xu, MD [facility] [phone] 12:18 PM     Implant Name Type Inv. Item Serial No. Manufacturer Lot No. LRB No. Used  CUP SECTOR GRIPTON 50MM - [id number] Cup CUP SECTOR GRIPTON 50MM  DEPUY SYNTHES [id number] Right 1  PINNACLE PLUS 4 NEUTRAL - [id number] Hips PINNACLE PLUS 4 NEUTRAL  DEPUY SYNTHES [id number] Right 1  STEM FEMORAL SZ3 STD ACTIS - [id number] Stem STEM FEMORAL SZ3 STD ACTIS  DEPUY ORTHOPAEDICS [id number] Right 1  HIP BALL ARTICU DEPUY - [id number] Hips HIP BALL ARTICU DEPUY  DEPUY SYNTHES [id number] Right 1

## 2018-03-20 NOTE — Plan of Care (Signed)
  Problem: Education: Goal: Knowledge of the prescribed therapeutic regimen will improve Outcome: Progressing   Problem: Activity: Goal: Ability to avoid complications of mobility impairment will improve Outcome: Progressing   Problem: Activity: Goal: Ability to tolerate increased activity will improve Outcome: Progressing   Problem: Skin Integrity: Goal: Signs of wound healing will improve Outcome: Progressing   Problem: Education: Goal: Knowledge of General Education information will improve Outcome: Progressing   Problem: Activity: Goal: Risk for activity intolerance will decrease Outcome: Progressing   Problem: Nutrition: Goal: Adequate nutrition will be maintained Outcome: Progressing

## 2018-03-20 NOTE — Anesthesia Procedure Notes (Signed)
Spinal  Patient location during procedure: OR Start time: 03/20/2018 10:49 AM End time: 03/20/2018 10:59 AM Staffing Anesthesiologist: Heather RobertsSinger, Seryna Marek, MD Performed: anesthesiologist  Preanesthetic Checklist Completed: patient identified, surgical consent, pre-op evaluation, timeout performed, IV checked, risks and benefits discussed and monitors and equipment checked Spinal Block Patient position: sitting Prep: DuraPrep Patient monitoring: cardiac monitor, continuous pulse ox and blood pressure Approach: midline Location: L2-3 Injection technique: single-shot Needle Needle type: Pencan  Needle gauge: 24 G Needle length: 9 cm Additional Notes Functioning IV was confirmed and monitors were applied. Sterile prep and drape, including hand hygiene and sterile gloves were used. The patient was positioned and the spine was prepped. The skin was anesthetized with lidocaine.  Free flow of clear CSF was obtained prior to injecting local anesthetic into the CSF.  The spinal needle aspirated freely following injection.  The needle was carefully withdrawn.  The patient tolerated the procedure well.

## 2018-03-20 NOTE — Transfer of Care (Signed)
Immediate Anesthesia Transfer of Care Note  Patient: Anne Terrell  Procedure(s) Performed: RIGHT TOTAL HIP ARTHROPLASTY ANTERIOR APPROACH (Right Hip)  Patient Location: PACU  Anesthesia Type:Regional  Level of Consciousness: awake, alert  and oriented  Airway & Oxygen Therapy: Patient Spontanous Breathing  Post-op Assessment: Report given to RN and Post -op Vital signs reviewed and stable  Post vital signs: Reviewed and stable  Last Vitals:  Vitals Value Taken Time  BP 83/61 03/20/2018 12:48 PM  Temp    Pulse 127 03/20/2018 12:53 PM  Resp 11 03/20/2018 12:54 PM  SpO2 100 % 03/20/2018 12:53 PM  Vitals shown include unvalidated device data.  Last Pain:  Vitals:   03/20/18 0851  TempSrc:   PainSc: 8       Patients Stated Pain Goal: 6 (03/20/18 0851)  Complications: No apparent anesthesia complications

## 2018-03-21 ENCOUNTER — Telehealth (INDEPENDENT_AMBULATORY_CARE_PROVIDER_SITE_OTHER): Payer: Self-pay | Admitting: Orthopaedic Surgery

## 2018-03-21 LAB — CBC
HCT: 29.6 % — ABNORMAL LOW (ref 36.0–46.0)
Hemoglobin: 9.6 g/dL — ABNORMAL LOW (ref 12.0–15.0)
MCH: 29.8 pg (ref 26.0–34.0)
MCHC: 32.4 g/dL (ref 30.0–36.0)
MCV: 91.9 fL (ref 78.0–100.0)
Platelets: 225 10*3/uL (ref 150–400)
RBC: 3.22 MIL/uL — AB (ref 3.87–5.11)
RDW: 13.4 % (ref 11.5–15.5)
WBC: 13.2 10*3/uL — ABNORMAL HIGH (ref 4.0–10.5)

## 2018-03-21 LAB — BASIC METABOLIC PANEL
Anion gap: 6 (ref 5–15)
BUN: 17 mg/dL (ref 8–23)
CO2: 28 mmol/L (ref 22–32)
CREATININE: 0.89 mg/dL (ref 0.44–1.00)
Calcium: 8.4 mg/dL — ABNORMAL LOW (ref 8.9–10.3)
Chloride: 106 mmol/L (ref 98–111)
GFR calc Af Amer: >60 mL/min (ref >60)
GFR calc non Af Amer: >60 mL/min (ref >60)
GLUCOSE: 218 mg/dL — AB (ref 70–99)
POTASSIUM: 3.7 mmol/L (ref 3.5–5.1)
SODIUM: 140 mmol/L (ref 135–145)

## 2018-03-21 NOTE — Telephone Encounter (Signed)
Devonne DoughtyShanna Shelton-nurse [facility] called advised the Rx ordered for Narcan per social worker she want taken out. Devonne DoughtyShanna advised per Child psychotherapistsocial worker the Rx is to expensive. The patient is being discharged tomorrow to a facility. The number to contact Devonne DoughtyShanna is [phone]

## 2018-03-21 NOTE — NC FL2 (Signed)
[place] MEDICAID FL2 LEVEL OF CARE SCREENING TOOL     IDENTIFICATION  Patient Name: Anne Terrell Birthdate: 1941/02/23 Sex: female Admission Date (Current Location): 03/20/2018  [facility] [place]   Facility and Address:  [facility],  [address], [place] [id number]      Provider Number: [id number]  Attending Physician Name and Address:  Tarry KosXu, Naiping M, MD  Relative Name and Phone Number:       Current Level of Care: Hospital Recommended Level of Care: Skilled Nursing Facility Prior Approval Number:    Date Approved/Denied:   PASRR Number:   [id number]   Discharge Plan: SNF    Current Diagnoses: Patient Active Problem List   Diagnosis Date Noted  . History of hip replacement 03/20/2018  . Primary osteoarthritis of right hip   . 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 Varicose veins of lower extremities with other complications 05/03/2014  . Major depressive disorder, recurrent episode, unspecified 04/11/2013  . Major depressive disorder, single episode, moderate (HCC) 01/05/2013  . Major depressive disorder, recurrent episode, severe, without mention of psychotic behavior 11/15/2012    Orientation RESPIRATION BLADDER Height & Weight     Self, Time, Situation, Place  Normal Continent Weight: 130 lb 9.6 oz (59.2 kg) Height:  5\' 3"  (160 cm)  BEHAVIORAL SYMPTOMS/MOOD NEUROLOGICAL BOWEL NUTRITION STATUS      Continent Diet(Regular )  AMBULATORY STATUS COMMUNICATION OF NEEDS Skin   Extensive Assist Verbally Normal(Right Hip )                       Personal Care Assistance Level of Assistance  Bathing, Feeding, Dressing Bathing Assistance: Limited assistance Feeding assistance: Independent Dressing Assistance: Limited assistance     Functional Limitations Info  Sight, Hearing, Speech Sight Info: Impaired Hearing Info: Adequate Speech Info: Adequate    SPECIAL CARE FACTORS FREQUENCY  PT (By licensed PT), OT (By  licensed OT)     PT Frequency: 7x/week  OT Frequency: 7x/week             Contractures Contractures Info: Not present    Additional Factors Info  Code Status, Allergies, Psychotropic Code Status Info: Fullcode Allergies Info: Allergies: Penicillins, Shellfish Allergy, Aspirin Psychotropic Info: Trazadone          Current Medications (03/21/2018):  This is the current hospital active medication list Current Facility-Administered Medications  Medication Dose Route Frequency Provider Last Rate Last Dose  . 0.9 %  sodium chloride infusion   Intravenous Continuous Cristie HemStanbery, Mary L, PA-C 50 mL/hr at 03/21/18 0746    . acetaminophen (TYLENOL) tablet 1,000 mg  1,000 mg Oral Q6H Tarry KosXu, Naiping M, MD   1,000 mg at 03/21/18 0542  . acetaminophen (TYLENOL) tablet 325-650 mg  325-650 mg Oral Q6H PRN Tarry KosXu, Naiping M, MD      . alum & mag hydroxide-simeth (MAALOX/MYLANTA) 200-200-20 MG/5ML suspension 30 mL  30 mL Oral Q4H PRN Tarry KosXu, Naiping M, MD      . diphenhydrAMINE (BENADRYL) 12.5 MG/5ML elixir 25 mg  25 mg Oral Q4H PRN Tarry KosXu, Naiping M, MD      . docusate sodium (COLACE) capsule 100 mg  100 mg Oral BID Tarry KosXu, Naiping M, MD   100 mg at 03/21/18 0956  . gabapentin (NEURONTIN) capsule 300 mg  300 mg Oral TID Tarry KosXu, Naiping M, MD   300 mg at 03/21/18 0956  . HYDROmorphone (DILAUDID) injection 0.5-1 mg  0.5-1 mg Intravenous Q4H PRN Roda ShuttersXu, Naiping  M, MD      . ketorolac (TORADOL) 15 MG/ML injection 30 mg  30 mg Intravenous Q6H Tarry Kos, MD   30 mg at 03/21/18 0553  . magnesium citrate solution 1 Bottle  1 Bottle Oral Once PRN Tarry Kos, MD      . menthol-cetylpyridinium (CEPACOL) lozenge 3 mg  1 lozenge Oral PRN Tarry Kos, MD       Or  . phenol (CHLORASEPTIC) mouth spray 1 spray  1 spray Mouth/Throat PRN Tarry Kos, MD      . methocarbamol (ROBAXIN) tablet 500 mg  500 mg Oral Q6H PRN Tarry Kos, MD       Or  . methocarbamol (ROBAXIN) 500 mg in dextrose 5 % 50 mL IVPB  500 mg Intravenous Q6H PRN  Tarry Kos, MD   Stopped at 03/20/18 1328  . metoCLOPramide (REGLAN) tablet 5-10 mg  5-10 mg Oral Q8H PRN Tarry Kos, MD       Or  . metoCLOPramide (REGLAN) injection 5-10 mg  5-10 mg Intravenous Q8H PRN Tarry Kos, MD      . ondansetron [facility]) tablet 4 mg  4 mg Oral Q6H PRN Tarry Kos, MD       Or  . ondansetron [facility]) injection 4 mg  4 mg Intravenous Q6H PRN Tarry Kos, MD      . oxyCODONE (Oxy IR/ROXICODONE) immediate release tablet 10-15 mg  10-15 mg Oral Q4H PRN Tarry Kos, MD   10 mg at 03/20/18 2206  . oxyCODONE (Oxy IR/ROXICODONE) immediate release tablet 5-10 mg  5-10 mg Oral Q4H PRN Tarry Kos, MD   5 mg at 03/21/18 0957  . oxyCODONE (OXYCONTIN) 12 hr tablet 10 mg  10 mg Oral Q12H Tarry Kos, MD   10 mg at 03/21/18 0957  . polyethylene glycol (MIRALAX / GLYCOLAX) packet 17 g  17 g Oral Daily PRN Tarry Kos, MD   17 g at 03/21/18 0955  . rivaroxaban (XARELTO) tablet 10 mg  10 mg Oral Q breakfast Tarry Kos, MD   10 mg at 03/21/18 0956  . sorbitol 70 % solution 30 mL  30 mL Oral Daily PRN Tarry Kos, MD      . traZODone (DESYREL) tablet 50 mg  50 mg Oral QHS Tarry Kos, MD   50 mg at 03/20/18 2205     Discharge Medications: Please see discharge summary for a list of discharge medications.  Relevant Imaging Results:  Relevant Lab Results:   Additional Information ssn:[phone]  Clearance Coots, LCSW

## 2018-03-21 NOTE — Progress Notes (Signed)
Subjective: 1 Day Post-Op Procedure(s) (LRB): RIGHT TOTAL HIP ARTHROPLASTY ANTERIOR APPROACH (Right) Patient reports pain as mild.  Doing well this am. No lightheadedness/dizziness, chest pain/palpitations or sob.  No nausea or vomiting  Objective: Vital signs in last 24 hours: Temp:  [97.5 F (36.4 C)-98.7 F (37.1 C)] 98.6 F (37 C) (07/16 0541) Pulse Rate:  [62-76] 67 (07/16 0548) Resp:  [11-19] 18 (07/16 0541) BP: (83-120)/(45-84) 104/45 (07/16 0548) SpO2:  [93 %-100 %] 93 % (07/16 0541) Weight:  [130 lb 9.6 oz (59.2 kg)] 130 lb 9.6 oz (59.2 kg) (07/15 0800)  Intake/Output from previous day: 07/15 0701 - 07/16 0700 In: 3412.5 [P.O.:480; I.V.:2677.5; IV Piggyback:255] Out: 2200 [Urine:1800; Blood:400] Intake/Output this shift: Total I/O In: 815 [P.O.:240; I.V.:375; IV Piggyback:200] Out: 1400 [Urine:1400]  Recent Labs    03/21/18 0615  HGB 9.6*   Recent Labs    03/21/18 0615  WBC 13.2*  RBC 3.22*  HCT 29.6*  PLT 225   No results for input(s): NA, K, CL, CO2, BUN, CREATININE, GLUCOSE, CALCIUM in the last 72 hours. No results for input(s): LABPT, INR in the last 72 hours.  Neurologically intact Neurovascular intact Sensation intact distally Intact pulses distally Dorsiflexion/Plantar flexion intact Incision: dressing C/D/I No cellulitis present Compartment soft    Assessment/Plan: 1 Day Post-Op Procedure(s) (LRB): RIGHT TOTAL HIP ARTHROPLASTY ANTERIOR APPROACH (Right) Advance diet Up with therapy Discharge to SNF once insurance approved and bed available WBAT RLE ABLA-mild and stable    Cristie HemMary L Stanbery 03/21/2018, 6:39 AM

## 2018-03-21 NOTE — Telephone Encounter (Signed)
FYI

## 2018-03-21 NOTE — Progress Notes (Signed)
Physical Therapy Evaluation Patient Details Name: Anne Terrell MRN: [id number] DOB: [birth year] Today's Date: 03/21/2018   History of Present Illness  R THA, direct anterior  Clinical Impression  The  Patient ambulated well. Plans SNF. Pt admitted with above diagnosis. Pt currently with functional limitations due to the deficits listed below (see PT Problem List).  Pt will benefit from skilled PT to increase their independence and safety with mobility to allow discharge to the venue listed below.     Follow Up Recommendations SNF    Equipment Recommendations  None recommended by PT    Recommendations for Other Services       Precautions / Restrictions Precautions Precautions: Fall Restrictions RLE Weight Bearing: Weight bearing as tolerated      Mobility  Bed Mobility Overal bed mobility: Needs Assistance Bed Mobility: Sit to Supine       Sit to supine: Min guard;Min assist   General bed mobility comments: used left foot to lift right leg onto bed  Transfers Overall transfer level: Needs assistance Equipment used: Rolling walker (2 wheeled) Transfers: Sit to/from Stand Sit to Stand: Min assist         General transfer comment: cues for safety  Ambulation/Gait Ambulation/Gait assistance: Min assist Gait Distance (Feet): 85 Feet Assistive device: Rolling walker (2 wheeled) Gait Pattern/deviations: Step-to pattern;Step-through pattern;Antalgic     General Gait Details: patient tolerated ambulation well.  cues for sequence  Stairs            Wheelchair Mobility    Modified Rankin (Stroke Patients Only)       Balance                                             Pertinent Vitals/Pain Pain Assessment: 0-10 Pain Score: 8  Pain Location: back and  hip Pain Descriptors / Indicators: Aching;Discomfort;Grimacing Pain Intervention(s): Monitored during session;Premedicated before session;Limited activity within patient's  tolerance;Ice applied    Home Living Family/patient expects to be discharged to:: Skilled nursing facility Living Arrangements: Alone   Type of Home: House       Home Layout: One level Home Equipment: Environmental consultant - 2 wheels      Prior Function Level of Independence: Independent               Hand Dominance        Extremity/Trunk Assessment   Upper Extremity Assessment Upper Extremity Assessment: Overall WFL for tasks assessed    Lower Extremity Assessment Lower Extremity Assessment: RLE deficits/detail RLE Deficits / Details: able to advance the right leg    Cervical / Trunk Assessment Cervical / Trunk Assessment: Normal  Communication   Communication: No difficulties  Cognition Arousal/Alertness: Awake/alert Behavior During Therapy: WFL for tasks assessed/performed Overall Cognitive Status: Within Functional Limits for tasks assessed                                        General Comments      Exercises     Assessment/Plan    PT Assessment Patient needs continued PT services  PT Problem List Decreased strength;Decreased range of motion;Decreased activity tolerance;Decreased knowledge of use of DME;Decreased safety awareness;Decreased mobility;Decreased knowledge of precautions;Pain       PT Treatment Interventions DME instruction;Therapeutic exercise;Gait training;Functional mobility training;Therapeutic activities;Patient/family education  PT Goals (Current goals can be found in the Care Plan section)  Acute Rehab PT Goals Patient Stated Goal: go to rehab PT Goal Formulation: With patient Time For Goal Achievement: 03/25/18 Potential to Achieve Goals: Good    Frequency 7X/week   Barriers to discharge Decreased caregiver support      Co-evaluation               AM-PAC PT "6 Clicks" Daily Activity  Outcome Measure Difficulty turning over in bed (including adjusting bedclothes, sheets and blankets)?: A  Little Difficulty moving from lying on back to sitting on the side of the bed? : A Little Difficulty sitting down on and standing up from a chair with arms (e.g., wheelchair, bedside commode, etc,.)?: A Little Help needed moving to and from a bed to chair (including a wheelchair)?: A Little Help needed walking in hospital room?: A Lot Help needed climbing 3-5 steps with a railing? : A Lot 6 Click Score: 16    End of Session   Activity Tolerance: Patient tolerated treatment well Patient left: in bed;with bed alarm set Nurse Communication: Mobility status PT Visit Diagnosis: Unsteadiness on feet (R26.81);Pain Pain - Right/Left: Right Pain - part of body: Hip    Time: [id number] PT Time Calculation (min) (ACUTE ONLY): 12 min   Charges:   PT Evaluation $PT Eval Low Complexity: 1 Low     PT G CodesBlanchard Kelch:        Zarya Lasseigne PT [phone]   [name], Lynasia Meloche [name] 03/21/2018, 1:08 PM

## 2018-03-21 NOTE — Progress Notes (Signed)
Plan for d/c to SNF, discharge planning per CSW. [phone] 

## 2018-03-21 NOTE — Clinical Social Work Note (Signed)
Clinical Social Work Assessment  Patient Details  Name: Anne Terrell MRN: [id number] Date of Birth: 17-Jan-1941  Date of referral:  03/21/18               Reason for consult:  Facility Placement                Permission sought to share information with:  Facility Sport and exercise psychologist, Case Manager Permission granted to share information::  Yes, Verbal Permission Granted  Name::        Agency::  SNF   Relationship::  Brother in Automotive engineer Information:    [phone]  Housing/Transportation Living arrangements for the past 2 months:  [place] of Information:  Patient Patient Interpreter Needed:  None Criminal Activity/Legal Involvement Pertinent to Current Situation/Hospitalization:  No - Comment as needed Significant Relationships:  None Lives with:  Self Do you feel safe going back to the place where you live?  Yes Need for family participation in patient care:  No (Coment)  Care giving concerns:   Right hip degenerative joint disease, surgical intervention-right total hip arthroplasty anterior approach (right).  SNF for short rehab placement.   Social Worker assessment / plan:  CSW met with the patient at bedside, explain role and reason for visit- to assist with discharge to SNF for short rehab. Patient reports she lives alone with her dog. Patient reports prior to surgery she used cane to ambulate. She was still driving up to three days prior to her surgery. She is hopeful she will continue to be independent bu this without the pain. Patient reports she has not been to a SNF and is not familiar with the process. CSW explain the SNF process and will follow up with bed offers. CSW explain the patient will need a 3 night stay inpatient.    FL2 Done.  PASRR complete.   Plan: SNF  Employment status:  Retired Forensic scientist:  Medicare PT Recommendations:  [place] / Referral to community resources:  [place]  Patient/Family's Response to care:  Agreeable and Responding well to care.   Patient/Family's Understanding of and Emotional Response to Diagnosis, Current Treatment, and Prognosis: Patient has a good understanding of her diagnosis and follow up care.  Emotional Assessment Appearance:  Appears stated age Attitude/Demeanor/Rapport:    Affect (typically observed):  Accepting, Calm Orientation:  Oriented to Self, Oriented to Place, Oriented to  Time, Oriented to Situation Alcohol / Substance use:  Not Applicable Psych involvement (Current and /or in the community):  No (Comment)  Discharge Needs  Concerns to be addressed:  Discharge Planning Concerns Readmission within the last 30 days:  No Current discharge risk:  Dependent with Mobility Barriers to Discharge:  Continued Medical Work up, [place], LCSW 03/21/2018, 11:37 AM

## 2018-03-21 NOTE — Telephone Encounter (Signed)
See message below °

## 2018-03-21 NOTE — Discharge Summary (Addendum)
Patient ID: Tritia Endo MRN: [id number] DOB/AGE: 12-18-1940 77 y.o.  Admit date: 03/20/2018 Discharge date: 03/23/2018  Admission Diagnoses:  Active Problems:   Primary osteoarthritis of right hip   History of hip replacement   Discharge Diagnoses:  Same  Past Medical History:  Diagnosis Date  . Anxiety   . Arthralgia   . Carpal tunnel syndrome   . DDD (degenerative disc disease), cervical   . DDD (degenerative disc disease), lumbar   . Depression   . Full dentures   . GERD (gastroesophageal reflux disease)   . History of hiatal hernia   . Migraine headache   . Myalgia   . Myofascial pain   . Osteoarthritis   . Primary localized osteoarthritis of right hip   . Right rotator cuff tear   . Spondylosis   . Varicose veins   . Wears glasses     Surgeries: Procedure(s): RIGHT TOTAL HIP ARTHROPLASTY ANTERIOR APPROACH on 03/20/2018   Consultants:   Discharged Condition: Improved  Hospital Course: Chalisa Kobler is an 77 y.o. female who was admitted 03/20/2018 for operative treatment of primary localized osteoarthritis right hip. Patient has severe unremitting pain that affects sleep, daily activities, and work/hobbies. After pre-op clearance the patient was taken to the operating room on 03/20/2018 and underwent  Procedure(s): RIGHT TOTAL HIP ARTHROPLASTY ANTERIOR APPROACH.    Patient was given perioperative antibiotics:  Anti-infectives (From admission, onward)   Start     Dose/Rate Route Frequency Ordered Stop   03/20/18 1700  ceFAZolin (ANCEF) IVPB 2g/100 mL premix     2 g 200 mL/hr over 30 Minutes Intravenous Every 6 hours 03/20/18 1432 03/21/18 0637   03/20/18 1210  vancomycin (VANCOCIN) powder  Status:  Discontinued       As needed 03/20/18 1210 03/20/18 1244   03/20/18 1038  ceFAZolin (ANCEF) 2-4 GM/100ML-% IVPB    Note to Pharmacy:  Kyra Leyland   : cabinet override      03/20/18 1038 03/20/18 2244   03/13/18 0930  clindamycin (CLEOCIN) IVPB 900 mg      900 mg 100 mL/hr over 30 Minutes Intravenous To ShortStay [facility] 03/09/18 1025 03/14/18 0930       Patient was given sequential compression devices, early ambulation, and chemoprophylaxis to prevent DVT.  Patient benefited maximally from hospital stay and there were no complications.    Recent vital signs:  Patient Vitals for the past 24 hrs:  BP Temp Temp src Pulse Resp SpO2  03/23/18 0612 (!) 116/54 99.5 F (37.5 C) Oral 92 16 95 %  03/22/18 1940 (!) 124/53 98 F (36.7 C) Oral 92 16 98 %  03/22/18 1307 (!) 111/57 97.9 F (36.6 C) Oral 84 16 99 %     Recent laboratory studies:  Recent Labs    03/21/18 0615  WBC 13.2*  HGB 9.6*  HCT 29.6*  PLT 225  NA 140  K 3.7  CL 106  CO2 28  BUN 17  CREATININE 0.89  GLUCOSE 218*  CALCIUM 8.4*     Discharge Medications:   Allergies as of 03/23/2018      Reactions   Penicillins Other (See Comments)   PATIENT HAS HAD A PCN REACTION WITH IMMEDIATE RASH, FACIAL/TONGUE/THROAT SWELLING, SOB, OR LIGHTHEADEDNESS WITH HYPOTENSION:  #  #  YES  #  # Has patient had a PCN reaction causing severe rash involving mucus membranes or skin necrosis: No Has patient had a PCN reaction that required hospitalization: No Has patient had a PCN  reaction occurring within the last 10 years: No If all of the above answers are "NO", then may proceed with Cephalosporin use.   Shellfish Allergy Nausea And Vomiting, Other (See Comments)   Esophageal spasms and vomiting per pt   Aspirin Other (See Comments)   "Stomach pain"      Medication List    STOP taking these medications   fentaNYL 50 MCG/HR Commonly known as:  DURAGESIC - dosed mcg/hr   NARCAN 4 MG/0.1ML Liqd nasal spray kit Generic drug:  naloxone     TAKE these medications   methocarbamol 750 MG tablet Commonly known as:  ROBAXIN Take 1 tablet (750 mg total) by mouth 2 (two) times daily as needed for muscle spasms.   ondansetron 4 MG tablet Commonly known as:  ZOFRAN Take 1-2  tablets (4-8 mg total) by mouth every 8 (eight) hours as needed for nausea or vomiting.   oxyCODONE 5 MG immediate release tablet Commonly known as:  Oxy IR/ROXICODONE Take 5 mg by mouth every 8 (eight) hours as needed for severe pain (tid prn). What changed:  Another medication with the same name was added. Make sure you understand how and when to take each.   oxyCODONE 5 MG immediate release tablet Commonly known as:  Oxy IR/ROXICODONE Take 1-3 tablets (5-15 mg total) by mouth every 4 (four) hours as needed. What changed:  You were already taking a medication with the same name, and this prescription was added. Make sure you understand how and when to take each.   oxyCODONE 10 mg 12 hr tablet Commonly known as:  OXYCONTIN Take 1 tablet (10 mg total) by mouth every 12 (twelve) hours for 3 days. What changed:  You were already taking a medication with the same name, and this prescription was added. Make sure you understand how and when to take each.   promethazine 25 MG tablet Commonly known as:  PHENERGAN Take 1 tablet (25 mg total) by mouth every 6 (six) hours as needed for nausea.   rivaroxaban 10 MG Tabs tablet Commonly known as:  XARELTO Take 1 tablet (10 mg total) by mouth daily.   senna-docusate 8.6-50 MG tablet Commonly known as:  SENOKOT S Take 1 tablet by mouth at bedtime as needed.   traZODone 50 MG tablet Commonly known as:  DESYREL TAKE 1 TABLET AT BEDTIME   venlafaxine XR 37.5 MG 24 hr capsule Commonly known as:  EFFEXOR-XR 1  qam  For  2  weeks            Durable Medical Equipment  (From admission, onward)        Start     Ordered   03/20/18 1432  DME Walker rolling  Once    Question:  Patient needs a walker to treat with the following condition  Answer:  History of hip replacement   03/20/18 1432   03/20/18 1432  DME 3 n 1  Once     03/20/18 1432   03/20/18 1432  DME Bedside commode  Once    Question:  Patient needs a bedside commode to treat with  the following condition  Answer:  History of hip replacement   03/20/18 1432      Diagnostic Studies: Dg Chest 2 View  Result Date: 03/03/2018 CLINICAL DATA:  Osteoarthritis.  Hip replacement. EXAM: CHEST - 2 VIEW COMPARISON:  No recent prior. FINDINGS: Mediastinum and hilar structures normal. Lungs are clear. No pleural effusion or pneumothorax. Heart size normal. Thoracic spine neurostimulator  noted. No acute bony abnormality. IMPRESSION: No acute cardiopulmonary disease. Electronically Signed   By: Marcello Moores  Register   On: 03/03/2018 07:10   Dg Pelvis Portable  Result Date: 03/20/2018 CLINICAL DATA:  77 year old female post right hip replacement. Initial encounter. EXAM: PORTABLE PELVIS 1-2 VIEWS COMPARISON:  02/28/2018. FINDINGS: Post total right hip replacement which appears in satisfactory position without complication noted on frontal projection. IMPRESSION: Post total right hip replacement. Electronically Signed   By: Genia Del M.D.   On: 03/20/2018 13:56   Dg C-arm 1-60 Min-no Report  Result Date: 03/20/2018 Fluoroscopy was utilized by the requesting physician.  No radiographic interpretation.   Dg Hip Operative Unilat With Pelvis Right  Result Date: 03/20/2018 CLINICAL DATA:  77 year old female undergoing right total hip replacement. EXAM: OPERATIVE RIGHT HIP (WITH PELVIS IF PERFORMED) 4 VIEWS TECHNIQUE: Fluoroscopic spot image(s) were submitted for interpretation post-operatively. COMPARISON:  Right hip and pelvis series 02/28/2018. FINDINGS: Four intraoperative fluoroscopic spot views of the right hip and lower pelvis. On the final 2 images a bipolar right hip arthroplasty is in place. The hardware appears intact and normally aligned in the AP view. No unexpected osseous changes are identified. IMPRESSION: Right bipolar hip arthroplasty with normal AP alignment. Electronically Signed   By: Genevie Ann M.D.   On: 03/20/2018 12:39   Xr Hip Unilat W Or W/o Pelvis 2-3 Views Right  Result  Date: 02/28/2018 Advanced right hip degenerative joint disease with significant joint space narrowing and periarticular spurring.   Disposition: Discharge disposition: 03-Skilled Nursing Facility          Contact information for follow-up providers    Leandrew Koyanagi, MD In 2 weeks.   Specialty:  Orthopedic Surgery Why:  For suture removal, For wound re-check Contact information: [place] [phone]            Contact information for after-discharge care    Destination    [facility] Preferred SNF .   Service:  Skilled Nursing Contact information: [place] [phone]                   Signed: Aundra Dubin 03/23/2018, 7:43 AM

## 2018-03-22 LAB — TYPE AND SCREEN
ABO/RH(D): A POS
Antibody Screen: NEGATIVE

## 2018-03-22 NOTE — Progress Notes (Signed)
Physical Therapy Treatment Patient Details Name: Anne Terrell MRN: [id number] DOB: [birth year] Today's Date: 03/22/2018    History of Present Illness R THA, direct anterior    PT Comments    The patient is mobilizing well, managers the right leg for bed mobility. Plans SNF.    Follow Up Recommendations  SNF     Equipment Recommendations  None recommended by PT    Recommendations for Other Services       Precautions / Restrictions Precautions Precautions: Fall    Mobility  Bed Mobility Overal bed mobility: Needs Assistance Bed Mobility: Supine to Sit;Sit to Supine     Supine to sit: Supervision Sit to supine: Supervision   General bed mobility comments: patient wanting to self assist.  Transfers Overall transfer level: Needs assistance Equipment used: Rolling walker (2 wheeled) Transfers: Sit to/from Stand           General transfer comment: cues for safety  Ambulation/Gait Ambulation/Gait assistance: Min guard Gait Distance (Feet): 20 Feet(x 2) Assistive device: Rolling walker (2 wheeled) Gait Pattern/deviations: Step-to pattern;Step-through pattern     General Gait Details: patient tolerated ambulation well.  cues for sequence   Stairs             Wheelchair Mobility    Modified Rankin (Stroke Patients Only)       Balance                                            Cognition Arousal/Alertness: Awake/alert                                            Exercises      General Comments        Pertinent Vitals/Pain Pain Score: 7  Pain Location: back and  hip Pain Descriptors / Indicators: Aching;Discomfort;Grimacing Pain Intervention(s): Patient requesting pain meds-RN notified;Ice applied;Repositioned    Home Living                      Prior Function            PT Goals (current goals can now be found in the care plan section) Progress towards PT goals: Progressing toward  goals    Frequency    7X/week      PT Plan Current plan remains appropriate    Co-evaluation              AM-PAC PT "6 Clicks" Daily Activity  Outcome Measure  Difficulty turning over in bed (including adjusting bedclothes, sheets and blankets)?: A Little Difficulty moving from lying on back to sitting on the side of the bed? : A Little Difficulty sitting down on and standing up from a chair with arms (e.g., wheelchair, bedside commode, etc,.)?: A Little Help needed moving to and from a bed to chair (including a wheelchair)?: A Little Help needed walking in hospital room?: A Little Help needed climbing 3-5 steps with a railing? : A Lot 6 Click Score: 17    End of Session   Activity Tolerance: Patient limited by pain Patient left: in bed;with call bell/phone within reach;with bed alarm set Nurse Communication: Mobility status PT Visit Diagnosis: Unsteadiness on feet (R26.81);Pain Pain - Right/Left: Right Pain - part of body: Hip  Time: 0803-0820 PT Time Calculation (min) (ACUTE ONLY): 17 min  Charges:  $Gait Training: 8-22 mins                    G CodesBlanchard Kelch:       Analeya Luallen PT [phone]    [name], [place] Goss[place] [name] 03/22/2018, 2:08 PM

## 2018-03-22 NOTE — Clinical Social Work Placement (Signed)
Patient insurance requires a 3 night inpatient stay for SNF placement . Tomorrow patient can D/C to [facility].   CLINICAL SOCIAL WORK PLACEMENT  NOTE  Date:  03/22/2018  Patient Details  Name: Anne Terrell MRN: [id number] Date of Birth: 28-Dec-1940  Clinical Social Work is seeking post-discharge placement for this patient at the Skilled  Nursing Facility level of care (*CSW will initial, date and re-position this form in  chart as items are completed):  Yes   Patient/family provided with [facility] Clinical Social Work Department's list of facilities offering this level of care within the geographic area requested by the patient (or if unable, by the patient's family).  Yes   Patient/family informed of their freedom to choose among providers that offer the needed level of care, that participate in Medicare, Medicaid or managed care program needed by the patient, have an available bed and are willing to accept the patient.  Yes   Patient/family informed of [facility] ownership interest in [facility] and [facility], as well as of the fact that they are under no obligation to receive care at these facilities.  PASRR submitted to EDS on       PASRR number received on       Existing PASRR number confirmed on       FL2 transmitted to all facilities in geographic area requested by pt/family on       FL2 transmitted to all facilities within larger geographic area on 03/22/18     Patient informed that his/her managed care company has contracts with or will negotiate with certain facilities, including the following:  [facility]     Yes   Patient/family informed of bed offers received.  [facility]     Physician recommends and patient chooses bed at      [facility] 03/[facility].  Patient to be transferred to facility by [facility]     Patient family notified on 03/23/18 of transfer.  Name of family member notified:         PHYSICIAN       Additional Comment:    _______________________________________________ Clearance CootsNicole A Tallis Soledad, LCSW 03/22/2018, 2:27 PM

## 2018-03-22 NOTE — Progress Notes (Signed)
Subjective: 2 Days Post-Op Procedure(s) (LRB): RIGHT TOTAL HIP ARTHROPLASTY ANTERIOR APPROACH (Right) Patient reports pain as mild.  Continuing to do well this am.  Objective: Vital signs in last 24 hours: Temp:  [97.5 F (36.4 C)-99 F (37.2 C)] 97.5 F (36.4 C) (07/17 0552) Pulse Rate:  [73-82] 73 (07/17 0552) Resp:  [14-16] 16 (07/17 0552) BP: (94-113)/(52-68) 94/56 (07/17 0552) SpO2:  [94 %-98 %] 95 % (07/17 0552)  Intake/Output from previous day: 07/16 0701 - 07/17 0700 In: 1399.2 [P.O.:480; I.V.:919.2] Out: 1400 [Urine:1400] Intake/Output this shift: No intake/output data recorded.  Recent Labs    03/21/18 0615  HGB 9.6*   Recent Labs    03/21/18 0615  WBC 13.2*  RBC 3.22*  HCT 29.6*  PLT 225   Recent Labs    03/21/18 0615  NA 140  K 3.7  CL 106  CO2 28  BUN 17  CREATININE 0.89  GLUCOSE 218*  CALCIUM 8.4*   No results for input(s): LABPT, INR in the last 72 hours.  Neurologically intact Neurovascular intact Sensation intact distally Intact pulses distally Dorsiflexion/Plantar flexion intact Incision: dressing C/D/I No cellulitis present Compartment soft    Assessment/Plan: 2 Days Post-Op Procedure(s) (LRB): RIGHT TOTAL HIP ARTHROPLASTY ANTERIOR APPROACH (Right) Advance diet Up with therapy D/C IV fluids Discharge to SNF once bed available (likely today) WBAT RLE-no precautions ABLA-mild and stable    Anne Terrell 03/22/2018, 7:58 AM

## 2018-03-22 NOTE — Telephone Encounter (Signed)
Ok, thanks.

## 2018-03-23 ENCOUNTER — Observation Stay (HOSPITAL_COMMUNITY)
Admission: EM | Admit: 2018-03-23 | Discharge: 2018-03-24 | Disposition: A | Discharge status: Skilled Nursing Facility | Payer: Medicare Other | Attending: Internal Medicine | Admitting: Internal Medicine

## 2018-03-23 ENCOUNTER — Other Ambulatory Visit: Payer: Self-pay

## 2018-03-23 ENCOUNTER — Encounter (HOSPITAL_COMMUNITY): Payer: Self-pay

## 2018-03-23 ENCOUNTER — Emergency Department (HOSPITAL_COMMUNITY): Payer: Medicare Other

## 2018-03-23 DIAGNOSIS — M5030 Other cervical disc degeneration, unspecified cervical region: Secondary | ICD-10-CM | POA: Insufficient documentation

## 2018-03-23 DIAGNOSIS — M5136 Other intervertebral disc degeneration, lumbar region: Secondary | ICD-10-CM | POA: Insufficient documentation

## 2018-03-23 DIAGNOSIS — F329 Major depressive disorder, single episode, unspecified: Secondary | ICD-10-CM | POA: Insufficient documentation

## 2018-03-23 DIAGNOSIS — Z7901 Long term (current) use of anticoagulants: Secondary | ICD-10-CM | POA: Insufficient documentation

## 2018-03-23 DIAGNOSIS — F419 Anxiety disorder, unspecified: Secondary | ICD-10-CM | POA: Insufficient documentation

## 2018-03-23 DIAGNOSIS — Z880 Allergy status to penicillin: Secondary | ICD-10-CM | POA: Insufficient documentation

## 2018-03-23 DIAGNOSIS — K219 Gastro-esophageal reflux disease without esophagitis: Secondary | ICD-10-CM | POA: Insufficient documentation

## 2018-03-23 DIAGNOSIS — Z87891 Personal history of nicotine dependence: Secondary | ICD-10-CM | POA: Insufficient documentation

## 2018-03-23 DIAGNOSIS — Z91013 Allergy to seafood: Secondary | ICD-10-CM | POA: Insufficient documentation

## 2018-03-23 DIAGNOSIS — Z886 Allergy status to analgesic agent status: Secondary | ICD-10-CM | POA: Insufficient documentation

## 2018-03-23 DIAGNOSIS — Z96641 Presence of right artificial hip joint: Secondary | ICD-10-CM | POA: Insufficient documentation

## 2018-03-23 DIAGNOSIS — K449 Diaphragmatic hernia without obstruction or gangrene: Secondary | ICD-10-CM | POA: Insufficient documentation

## 2018-03-23 DIAGNOSIS — D62 Acute posthemorrhagic anemia: Secondary | ICD-10-CM | POA: Diagnosis not present

## 2018-03-23 DIAGNOSIS — R509 Fever, unspecified: Principal | ICD-10-CM | POA: Insufficient documentation

## 2018-03-23 DIAGNOSIS — R6510 Systemic inflammatory response syndrome (SIRS) of non-infectious origin without acute organ dysfunction: Secondary | ICD-10-CM | POA: Diagnosis not present

## 2018-03-23 DIAGNOSIS — Z79899 Other long term (current) drug therapy: Secondary | ICD-10-CM | POA: Insufficient documentation

## 2018-03-23 LAB — TYPE AND SCREEN
ABO/RH(D): A POS
Antibody Screen: NEGATIVE

## 2018-03-23 LAB — CBC WITH DIFFERENTIAL/PLATELET
BASOS ABS: 0 10*3/uL (ref 0.0–0.1)
BASOS PCT: 0 %
EOS ABS: 0.2 10*3/uL (ref 0.0–0.7)
Eosinophils Relative: 2 %
HCT: 30.2 % — ABNORMAL LOW (ref 36.0–46.0)
HEMOGLOBIN: 9.6 g/dL — AB (ref 12.0–15.0)
Lymphocytes Relative: 17 %
Lymphs Abs: 1.7 10*3/uL (ref 0.7–4.0)
MCH: 29.7 pg (ref 26.0–34.0)
MCHC: 31.8 g/dL (ref 30.0–36.0)
MCV: 93.5 fL (ref 78.0–100.0)
MONOS PCT: 9 %
Monocytes Absolute: 0.9 10*3/uL (ref 0.1–1.0)
NEUTROS PCT: 72 %
Neutro Abs: 7.2 10*3/uL (ref 1.7–7.7)
Platelets: 250 10*3/uL (ref 150–400)
RBC: 3.23 MIL/uL — ABNORMAL LOW (ref 3.87–5.11)
RDW: 13.9 % (ref 11.5–15.5)
WBC: 10 10*3/uL (ref 4.0–10.5)

## 2018-03-23 LAB — I-STAT CG4 LACTIC ACID, ED
LACTIC ACID, VENOUS: 1.29 mmol/L (ref 0.5–1.9)
Lactic Acid, Venous: 0.79 mmol/L (ref 0.5–1.9)

## 2018-03-23 LAB — URINALYSIS, ROUTINE W REFLEX MICROSCOPIC
BILIRUBIN URINE: NEGATIVE
Bacteria, UA: NONE SEEN
GLUCOSE, UA: NEGATIVE mg/dL
KETONES UR: NEGATIVE mg/dL
LEUKOCYTES UA: NEGATIVE
NITRITE: NEGATIVE
PH: 6 (ref 5.0–8.0)
Protein, ur: NEGATIVE mg/dL
Specific Gravity, Urine: 1.006 (ref 1.005–1.030)

## 2018-03-23 LAB — COMPREHENSIVE METABOLIC PANEL
ALBUMIN: 2.9 g/dL — AB (ref 3.5–5.0)
ALK PHOS: 52 U/L (ref 38–126)
ALT: 13 U/L (ref 0–44)
ANION GAP: 7 (ref 5–15)
AST: 30 U/L (ref 15–41)
BILIRUBIN TOTAL: 0.2 mg/dL — AB (ref 0.3–1.2)
BUN: 16 mg/dL (ref 8–23)
CALCIUM: 8.6 mg/dL — AB (ref 8.9–10.3)
CO2: 35 mmol/L — ABNORMAL HIGH (ref 22–32)
Chloride: 101 mmol/L (ref 98–111)
Creatinine, Ser: 0.82 mg/dL (ref 0.44–1.00)
GLUCOSE: 99 mg/dL (ref 70–99)
Potassium: 4.3 mmol/L (ref 3.5–5.1)
Sodium: 143 mmol/L (ref 135–145)
TOTAL PROTEIN: 5.5 g/dL — AB (ref 6.5–8.1)

## 2018-03-23 LAB — PROTIME-INR
INR: 1.15
PROTHROMBIN TIME: 14.6 s (ref 11.4–15.2)

## 2018-03-23 MED ORDER — SODIUM CHLORIDE 0.9 % IV SOLN
2.0000 g | Freq: Once | INTRAVENOUS | Status: AC
  Administered 2018-03-23: 2 g via INTRAVENOUS
  Filled 2018-03-23: qty 2

## 2018-03-23 MED ORDER — METRONIDAZOLE IN NACL 5-0.79 MG/ML-% IV SOLN
500.0000 mg | Freq: Once | INTRAVENOUS | Status: DC
  Administered 2018-03-23: 500 mg via INTRAVENOUS
  Filled 2018-03-23: qty 100

## 2018-03-23 MED ORDER — SODIUM CHLORIDE 0.9 % IV SOLN
INTRAVENOUS | Status: DC
  Administered 2018-03-23 – 2018-03-24 (×2): via INTRAVENOUS

## 2018-03-23 MED ORDER — ENSURE ENLIVE PO LIQD
237.0000 mL | Freq: Two times a day (BID) | ORAL | Status: DC
  Administered 2018-03-24 (×2): 237 mL via ORAL

## 2018-03-23 MED ORDER — VANCOMYCIN HCL IN DEXTROSE 1-5 GM/200ML-% IV SOLN: 1000.0000 mg | INTRAVENOUS | Status: DC

## 2018-03-23 MED ORDER — ACETAMINOPHEN 325 MG PO TABS: 650.0000 mg | ORAL_TABLET | Freq: Four times a day (QID) | ORAL | Status: DC | PRN

## 2018-03-23 MED ORDER — SODIUM CHLORIDE 0.9 % IV SOLN
1.0000 g | Freq: Three times a day (TID) | INTRAVENOUS | Status: DC
  Administered 2018-03-24 (×2): 1 g via INTRAVENOUS
  Filled 2018-03-23 (×3): qty 1

## 2018-03-23 MED ORDER — VANCOMYCIN HCL IN DEXTROSE 1-5 GM/200ML-% IV SOLN
1000.0000 mg | Freq: Once | INTRAVENOUS | Status: AC
  Administered 2018-03-23: 1000 mg via INTRAVENOUS
  Filled 2018-03-23: qty 200

## 2018-03-23 MED ORDER — SODIUM CHLORIDE 0.9 % IV BOLUS (SEPSIS)
500.0000 mL | Freq: Once | INTRAVENOUS | Status: AC
  Administered 2018-03-23: 500 mL via INTRAVENOUS

## 2018-03-23 MED ORDER — ENOXAPARIN SODIUM 40 MG/0.4ML ~~LOC~~ SOLN
40.0000 mg | SUBCUTANEOUS | Status: DC
  Administered 2018-03-23: 40 mg via SUBCUTANEOUS
  Filled 2018-03-23: qty 0.4

## 2018-03-23 MED ORDER — ONDANSETRON HCL 4 MG PO TABS: 4.0000 mg | ORAL_TABLET | Freq: Four times a day (QID) | ORAL | Status: DC | PRN

## 2018-03-23 MED ORDER — TRAZODONE HCL 50 MG PO TABS
50.0000 mg | ORAL_TABLET | Freq: Every day | ORAL | Status: DC
  Administered 2018-03-23: 50 mg via ORAL
  Filled 2018-03-23: qty 1

## 2018-03-23 MED ORDER — ACETAMINOPHEN 650 MG RE SUPP: 650.0000 mg | Freq: Four times a day (QID) | RECTAL | Status: DC | PRN

## 2018-03-23 MED ORDER — HYDROCODONE-ACETAMINOPHEN 5-325 MG PO TABS
1.0000 | ORAL_TABLET | Freq: Four times a day (QID) | ORAL | Status: DC | PRN
  Administered 2018-03-23 – 2018-03-24 (×2): 1 via ORAL
  Administered 2018-03-24: 2 via ORAL
  Filled 2018-03-23: qty 1
  Filled 2018-03-23: qty 2
  Filled 2018-03-23: qty 1

## 2018-03-23 MED ORDER — SENNOSIDES-DOCUSATE SODIUM 8.6-50 MG PO TABS
1.0000 | ORAL_TABLET | Freq: Every day | ORAL | Status: DC
  Administered 2018-03-23: 1 via ORAL
  Filled 2018-03-23: qty 1

## 2018-03-23 MED ORDER — SODIUM CHLORIDE 0.9 % IV BOLUS (SEPSIS)
250.0000 mL | Freq: Once | INTRAVENOUS | Status: AC
  Administered 2018-03-23: 250 mL via INTRAVENOUS

## 2018-03-23 MED ORDER — ONDANSETRON HCL 4 MG/2ML IJ SOLN: 4.0000 mg | Freq: Four times a day (QID) | INTRAMUSCULAR | Status: DC | PRN

## 2018-03-23 MED ORDER — SODIUM CHLORIDE 0.9 % IV BOLUS (SEPSIS)
1000.0000 mL | Freq: Once | INTRAVENOUS | Status: AC
  Administered 2018-03-23: 1000 mL via INTRAVENOUS

## 2018-03-23 MED ORDER — METHOCARBAMOL 500 MG PO TABS
750.0000 mg | ORAL_TABLET | Freq: Two times a day (BID) | ORAL | Status: DC | PRN
  Administered 2018-03-24: 750 mg via ORAL
  Filled 2018-03-23: qty 2

## 2018-03-23 NOTE — ED Provider Notes (Signed)
[facility]-EMERGENCY DEPT Provider Note   CSN: [id number] Arrival date & time: 03/23/18  1521     History   Chief Complaint Chief Complaint  Patient presents with  . Wound Infection  . Fever    HPI Juluis Mirelice Bond is a 77 y.o. female.  HPI   Patient is a 77 year old female with a history of GERD, degenerative disc disease, osteoarthritis, who presents emergency department today for a possible wound infection.  Patient presents to the ED today complaining of fevers that she noted this morning.  She also reports redness, swelling, warmth to the right hip which she had replaced on 03/20/18 with Dr. Roda ShuttersXu. Denies observing any drainage from the wound. [facility] and on arrival [facility] she was noted to have the redness, swelling and warmth to the right hip area.  She was also noted to have fevers.  She was sent back to the emergency department.  She states she received 1000 mg of Tylenol in route to the ED around 2 PM. Denies exacerbating or alleviating factors.   She denies any other sxs including no chest pain, shortness of breath, cough, abdominal pain, nausea, vomiting, diarrhea or urinary symptoms.  She states that she has been using her incentive spirometer as directed.  Past Medical History:  Diagnosis Date  . Anxiety   . Arthralgia   . Carpal tunnel syndrome   . DDD (degenerative disc disease), cervical   . DDD (degenerative disc disease), lumbar   . Depression   . Full dentures   . GERD (gastroesophageal reflux disease)   . History of hiatal hernia   . Migraine headache   . Myalgia   . Myofascial pain   . Osteoarthritis   . Primary localized osteoarthritis of right hip   . Right rotator cuff tear   . Spondylosis   . Varicose veins   . Wears glasses     Patient Active Problem List   Diagnosis Date Noted  . SIRS (systemic inflammatory response syndrome) (HCC) 03/23/2018  . Acute blood loss anemia 03/23/2018    . History of hip replacement 03/20/2018  . Primary osteoarthritis of right hip   . 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 Varicose veins of lower extremities with other complications 05/03/2014  . Major depressive disorder, recurrent episode, unspecified 04/11/2013  . Major depressive disorder, single episode, moderate (HCC) 01/05/2013  . Major depressive disorder, recurrent episode, severe, without mention of psychotic behavior 11/15/2012    Past Surgical History:  Procedure Laterality Date  . ABDOMINAL HYSTERECTOMY  1988  . APPENDECTOMY    . CARPAL TUNNEL RELEASE Bilateral   . COLONOSCOPY WITH ESOPHAGOGASTRODUODENOSCOPY (EGD)    . dental posts     holds bottom dentures in place  . DILATION AND CURETTAGE OF UTERUS    . FRACTURE SURGERY Left    LLE  . MULTIPLE TOOTH EXTRACTIONS    . SPINAL CORD STIMULATOR INSERTION     located near the right hip and waist area  . TONSILLECTOMY    . TOTAL HIP ARTHROPLASTY Right 03/20/2018   Procedure: RIGHT TOTAL HIP ARTHROPLASTY ANTERIOR APPROACH;  Surgeon: Tarry KosXu, Naiping M, MD;  Location: [facility] ORS;  Service: Orthopedics;  Laterality: Right;     OB History   None      Home Medications    Prior to Admission medications   Medication Sig Start Date End Date Taking? Authorizing Provider  oxyCODONE (OXY IR/ROXICODONE) 5 MG immediate release tablet Take 5  mg by mouth every 8 (eight) hours as needed for severe pain (tid prn).   Yes [provider]  traZODone (DESYREL) 50 MG tablet TAKE 1 TABLET AT BEDTIME 11/02/17  Yes Plovsky, Earvin Hansen, MD  methocarbamol (ROBAXIN) 750 MG tablet Take 1 tablet (750 mg total) by mouth 2 (two) times daily as needed for muscle spasms. 03/20/18   Tarry Kos, MD  ondansetron (ZOFRAN) 4 MG tablet Take 1-2 tablets (4-8 mg total) by mouth every 8 (eight) hours as needed for nausea or vomiting. 03/20/18   Tarry Kos, MD  oxyCODONE (OXY IR/ROXICODONE) 5 MG immediate release tablet Take 1-3 tablets (5-15 mg total) by mouth  every 4 (four) hours as needed. 03/20/18   Tarry Kos, MD  oxyCODONE (OXYCONTIN) 10 mg 12 hr tablet Take 1 tablet (10 mg total) by mouth every 12 (twelve) hours for 3 days. 03/20/18 03/23/18  Tarry Kos, MD  promethazine (PHENERGAN) 25 MG tablet Take 1 tablet (25 mg total) by mouth every 6 (six) hours as needed for nausea. 03/20/18   Tarry Kos, MD  rivaroxaban (XARELTO) 10 MG TABS tablet Take 1 tablet (10 mg total) by mouth daily. 03/20/18   Tarry Kos, MD  senna-docusate (SENOKOT S) 8.6-50 MG tablet Take 1 tablet by mouth at bedtime as needed. 03/20/18   Tarry Kos, MD  venlafaxine XR (EFFEXOR-XR) 37.5 MG 24 hr capsule 1  qam  For  2  weeks Patient not taking: Reported on 03/23/2018 01/06/18   Archer Asa, MD    Family History Family History  Problem Relation Age of Onset  . Diabetes Mother   . Varicose Veins Mother   . Asthma Mother   . Heart defect Sister     Social History Social History   Tobacco Use  . Smoking status: Former Smoker    Types: Cigarettes    Last attempt to quit: 05/03/2004    Years since quitting: 13.8  . Smokeless tobacco: Never Used  Substance Use Topics  . Alcohol use: No    Alcohol/week: 0.0 oz  . Drug use: No     Allergies   Penicillins; Shellfish allergy; and Aspirin   Review of Systems Review of Systems  Constitutional: Positive for chills and fever.  HENT: Negative for congestion and sore throat.   Eyes: Negative for visual disturbance.  Respiratory: Negative for cough, shortness of breath and wheezing.   Cardiovascular: Negative for chest pain.  Gastrointestinal: Negative for abdominal pain, constipation, diarrhea, nausea and vomiting.  Genitourinary: Negative for dysuria, flank pain, frequency, hematuria and urgency.  Musculoskeletal:       Right hip pain  Skin: Positive for color change and wound.  Neurological: Negative for dizziness, weakness, light-headedness, numbness and headaches.    Physical Exam Updated Vital  Signs BP (!) 142/55 (BP Location: Left Arm)   Pulse 93   Temp 99.1 F (37.3 C)   Resp 18   Ht 5\' 3"  (1.6 m)   Wt 65.7 kg (144 lb 13.5 oz)   SpO2 100%   BMI 25.66 kg/m   Physical Exam  Constitutional: She is oriented to person, place, and time. She appears well-developed and well-nourished. No distress.  HENT:  Head: Normocephalic and atraumatic.  Mouth/Throat: Oropharynx is clear and moist.  Eyes: Conjunctivae are normal.  Neck: Neck supple.  Cardiovascular: Normal rate, regular rhythm, normal heart sounds and intact distal pulses.  No murmur heard. Pulmonary/Chest: Effort normal and breath sounds normal. No respiratory distress.  Abdominal: Soft. Bowel sounds are normal. There is no tenderness.  Musculoskeletal: Normal range of motion.  Pt has erythema and warmth to the right hip surrounding the incision site on the right hip. Mildly TTP. Dressing in place and on removing wound dressing there are steri-strips in place and there is no obvious drainage from the wound. Moving all extremities.  Neurological: She is alert and oriented to person, place, and time.  Skin: Skin is warm and dry. Capillary refill takes less than 2 seconds.  Psychiatric: She has a normal mood and affect.  Nursing note and vitals reviewed.   ED Treatments / Results  Labs (all labs ordered are listed, but only abnormal results are displayed) Labs Reviewed  COMPREHENSIVE METABOLIC PANEL - Abnormal; Notable for the following components:      Result Value   CO2 35 (*)    Calcium 8.6 (*)    Total Protein 5.5 (*)    Albumin 2.9 (*)    Total Bilirubin 0.2 (*)    All other components within normal limits  CBC WITH DIFFERENTIAL/PLATELET - Abnormal; Notable for the following components:   RBC 3.23 (*)    Hemoglobin 9.6 (*)    HCT 30.2 (*)    All other components within normal limits  URINALYSIS, ROUTINE W REFLEX MICROSCOPIC - Abnormal; Notable for the following components:   Color, Urine STRAW (*)    Hgb  urine dipstick MODERATE (*)    All other components within normal limits  CULTURE, BLOOD (ROUTINE X 2)  CULTURE, BLOOD (ROUTINE X 2)  URINE CULTURE  PROTIME-INR  BASIC METABOLIC PANEL  CBC  I-STAT CG4 LACTIC ACID, ED  I-STAT CG4 LACTIC ACID, ED  TYPE AND SCREEN    EKG None  Radiology Dg Chest 2 View  Result Date: 03/23/2018 CLINICAL DATA:  Fever, hip pain EXAM: CHEST - 2 VIEW COMPARISON:  03/02/2018 FINDINGS: Heart and mediastinal contours are within normal limits. No focal opacities or effusions. No acute bony abnormality. Spinal stimulator wires noted in the midthoracic region, stable. IMPRESSION: No active cardiopulmonary disease. Electronically Signed   By: Charlett Nose M.D.   On: 03/23/2018 18:46   Dg Hip Unilat W Or Wo Pelvis 2-3 Views Right  Result Date: 03/23/2018 CLINICAL DATA:  Fever, hip pain, recent hip replacement EXAM: DG HIP (WITH OR WITHOUT PELVIS) 2-3V RIGHT COMPARISON:  03/20/2017 FINDINGS: Changes of right hip replacement. No hardware or bony complicating feature. No acute bony abnormality. Soft tissue swelling and soft tissue gas noted laterally, likely postoperative. IMPRESSION: Right hip replacement.  No hardware or bony complicating feature. Electronically Signed   By: Charlett Nose M.D.   On: 03/23/2018 18:45    Procedures Procedures (including critical care time) CRITICAL CARE Performed by: Karrie Meres   Total critical care time: 32 minutes  Critical care time was exclusive of separately billable procedures and treating other patients.  Critical care was necessary to treat or prevent imminent or life-threatening deterioration.  Critical care was time spent personally by me on the following activities: development of treatment plan with patient and/or surrogate as well as nursing, discussions with consultants, evaluation of patient's response to treatment, examination of patient, obtaining history from patient or surrogate, ordering and performing  treatments and interventions, ordering and review of laboratory studies, ordering and review of radiographic studies, pulse oximetry and re-evaluation of patient's condition.   Medications Ordered in ED Medications  methocarbamol (ROBAXIN) tablet 750 mg (has no administration in time range)  senna-docusate (Senokot-S)  tablet 1 tablet (1 tablet Oral Given 03/23/18 2325)  traZODone (DESYREL) tablet 50 mg (50 mg Oral Given 03/23/18 2326)  acetaminophen (TYLENOL) tablet 650 mg (has no administration in time range)    Or  acetaminophen (TYLENOL) suppository 650 mg (has no administration in time range)  ondansetron (ZOFRAN) tablet 4 mg (has no administration in time range)    Or  ondansetron (ZOFRAN) injection 4 mg (has no administration in time range)  enoxaparin (LOVENOX) injection 40 mg (40 mg Subcutaneous Given 03/23/18 2326)  0.9 %  sodium chloride infusion ( Intravenous New Bag/Given 03/23/18 2311)  feeding supplement (ENSURE ENLIVE) (ENSURE ENLIVE) liquid 237 mL (has no administration in time range)  HYDROcodone-acetaminophen (NORCO/VICODIN) 5-325 MG per tablet 1-2 tablet (1 tablet Oral Given 03/23/18 2325)  sodium chloride 0.9 % bolus 1,000 mL (0 mLs Intravenous Stopped 03/23/18 1700)    And  sodium chloride 0.9 % bolus 500 mL (0 mLs Intravenous Stopped 03/23/18 1659)    And  sodium chloride 0.9 % bolus 250 mL (0 mLs Intravenous Stopped 03/23/18 1642)  vancomycin (VANCOCIN) IVPB 1000 mg/200 mL premix (0 mg Intravenous Stopped 03/23/18 1744)  ceFEPIme (MAXIPIME) 2 g in sodium chloride 0.9 % 100 mL IVPB (0 g Intravenous Stopped 03/23/18 1700)     Initial Impression / Assessment and Plan / ED Course  I have reviewed the triage vital signs and the nursing notes.  Pertinent labs & imaging results that were available during my care of the patient were reviewed by me and considered in my medical decision making (see chart for details).   5:09 PM CONSULT With Pharmacy who states that flagyl was  added to cover anaerobes.   5:45 PM reevaluated pt. She is now c/o bilat lower abd pain and stating that she is unable to urinate. Will place order for in and out cath and re-eval.   Nursing staff stated that pts pain improved after urinating.  8:41 PM CONSULT, with [facility], Dr. Darlin Drop, who recommended medicine admission. They will consult on the patient.  Discussed case with hospitalist service, Dr. Toniann Fail, who will admit the patient.   Final Clinical Impressions(s) / ED Diagnoses   Final diagnoses:  SIRS (systemic inflammatory response syndrome) (HCC)   Pt is presenting with erythema and warmth to the right hip area.  She was febrile to 100.8 here after 1000mg  of Tylenol were given prior to arrival.  She is also tachycardic but otherwise vital signs stable.  Sepsis protocol initiated.  IV fluids and IV antibiotics initiated.  CBC with no leukocytosis.  CMP with normal kidney and liver function.  Electro lites grossly normal.  Blood cultures in process.  Lactic acid normal.  UA without evidence of UTI. CXR without pneumonia.  ECG with sinus tachycardia.  Suspected cellulitis to post op wound of the right hip. Will consult orthopedic surgery. Ortho consulted as above. They will see the patient. They recommended medicine admission.  Pt admitted to the hospitalist service.   ED Discharge Orders    None       Rayne Du 03/23/18 2337    Charlynne Pander, MD 03/23/18 [phone]

## 2018-03-23 NOTE — Care Management Important Message (Signed)
Important Message  Patient Details  Name: Juluis Mirelice Kriesel MRN: [id number] Date of Birth: [birth year]   Medicare Important Message Given:  Yes    Caren MacadamFuller, Gari Hartsell 03/23/2018, 11:01 AMImportant Message  Patient Details  Name: Juluis Mirelice Kennebrew MRN: [id number] Date of Birth: [birth year]   Medicare Important Message Given:  Yes    Caren MacadamFuller, Maxamillion Banas 03/23/2018, 11:01 AM

## 2018-03-23 NOTE — ED Notes (Signed)
Pt reminded of need for urine pt states she is unable to void at this time

## 2018-03-23 NOTE — Progress Notes (Signed)
Physical Therapy Treatment Patient Details Name: Anne Terrell MRN: [id number] DOB: 12/31/1940 Today's Date: 03/23/2018    History of Present Illness R THA, direct anterior    PT Comments    Patient complains of sinus headache, declined ambulation at this time. Performed HEP. Plans DC to SNF today.   Follow Up Recommendations  SNF     Equipment Recommendations  None recommended by PT    Recommendations for Other Services       Precautions / Restrictions Precautions Precautions: Fall Restrictions Weight Bearing Restrictions: No RLE Weight Bearing: Weight bearing as tolerated    Mobility  Bed Mobility               General bed mobility comments: patient declined OOB, just amb. to BR  Transfers                 General transfer comment: NT  Ambulation/Gait                 Stairs             Wheelchair Mobility    Modified Rankin (Stroke Patients Only)       Balance                                            Cognition Arousal/Alertness: Awake/alert                                            Exercises Total Joint Exercises Ankle Circles/Pumps: AROM;Right;10 reps Short Arc Quad: AROM;Right;10 reps Heel Slides: AAROM;Right;10 reps Hip ABduction/ADduction: AAROM;Right;10 reps    General Comments        Pertinent Vitals/Pain Pain Score: 7  Pain Location: "sinus headache" Pain Descriptors / Indicators: Aching Pain Intervention(s): (cool washcloth)    Home Living                      Prior Function            PT Goals (current goals can now be found in the care plan section) Progress towards PT goals: Progressing toward goals    Frequency    7X/week      PT Plan Current plan remains appropriate    Co-evaluation              AM-PAC PT "6 Clicks" Daily Activity  Outcome Measure  Difficulty turning over in bed (including adjusting bedclothes, sheets and  blankets)?: A Little Difficulty moving from lying on back to sitting on the side of the bed? : A Little Difficulty sitting down on and standing up from a chair with arms (e.g., wheelchair, bedside commode, etc,.)?: A Little Help needed moving to and from a bed to chair (including a wheelchair)?: A Little Help needed walking in hospital room?: A Little Help needed climbing 3-5 steps with a railing? : A Lot 6 Click Score: 17    End of Session   Activity Tolerance: Patient limited by pain Patient left: in bed;with call bell/phone within reach;with bed alarm set Nurse Communication: Mobility status PT Visit Diagnosis: Unsteadiness on feet (R26.81);Pain Pain - Right/Left: Right Pain - part of body: (headache)     Time: [id number] PT Time Calculation (min) (ACUTE ONLY): 10 min  Charges:  $Therapeutic  Exercise: 8-22 mins                    G Codes:       Blanchard Kelch PT [phone]    Rada Hay 03/23/2018, 11:38 AM

## 2018-03-23 NOTE — ED Notes (Signed)
Pt has pure wick in place to obtain specimen

## 2018-03-23 NOTE — ED Notes (Signed)
Bed: [id number] Expected date:  Expected time:  Means of arrival:  Comments: EMS-possible infection

## 2018-03-23 NOTE — Progress Notes (Signed)
A consult was received from an ED physician for vanc/cefepime/flagyl per pharmacy dosing.  The patient's profile has been reviewed for ht/wt/allergies/indication/available labs.   A one time order has been placed for vanc 1g, cefepime 2g, and flagyl 500mg .  Further antibiotics/pharmacy consults should be ordered by admitting physician if indicated.                       Thank you, Berkley HarveyLegge, Paxson Harrower Marshall 03/23/2018  4:24 PM

## 2018-03-23 NOTE — Progress Notes (Signed)
Patient [facility] via [facility]. Report called to facility. All belongings with patient.

## 2018-03-23 NOTE — ED Triage Notes (Signed)
Patient BIB EMS from [facility]. [facility] was [facility] a couple of hours ago. Patient was admitted for right hip replacement. [facility], patient reported redness, swelling, and warmth from incision site and low grade fever (100.85F) Patient is AxOx4. Patient reports her pain is at her baseline and is able to ambulate.

## 2018-03-23 NOTE — Progress Notes (Signed)
Pharmacy Antibiotic Note  Anne Terrell is a 77 y.o. female admitted on 03/23/2018 with  history of GERD, degenerative disc disease, osteoarthritis, who presents ED today for a possible wound infection.  Her right Hip was replaced on 03/20/18. Pharmacy has been consulted for vancomycin and cefepim dosing for sepsis.  Plan: Cefepime 2 gm x 1 then 1gm q8h Vancomycin 1gm IV x 1 then 1Gm q24h (AUC 519.4, Scr 0.82) Follow renal fxn, cultures and clinical coarse  Height: 5\' 3"  (160 cm) Weight: 144 lb 13.5 oz (65.7 kg) IBW/kg (Calculated) : 52.4  Temp (24hrs), Avg:99.5 F (37.5 C), Min:98.4 F (36.9 C), Max:100.8 F (38.2 C)  Recent Labs  Lab 03/21/18 0615 03/23/18 1600 03/23/18 1605 03/23/18 1850  WBC 13.2* 10.0  --   --   CREATININE 0.89 0.82  --   --   LATICACIDVEN  --   --  1.29 0.79    Estimated Creatinine Clearance: 54 mL/min (by C-G formula based on SCr of 0.82 mg/dL).    Allergies  Allergen Reactions  . Penicillins Other (See Comments)    PATIENT HAS HAD A PCN REACTION WITH IMMEDIATE RASH, FACIAL/TONGUE/THROAT SWELLING, SOB, OR LIGHTHEADEDNESS WITH HYPOTENSION:  #  #  YES  #  # Has patient had a PCN reaction causing severe rash involving mucus membranes or skin necrosis: No Has patient had a PCN reaction that required hospitalization: No Has patient had a PCN reaction occurring within the last 10 years: No If all of the above answers are "NO", then may proceed with Cephalosporin use.  . Shellfish Allergy Nausea And Vomiting and Other (See Comments)    Esophageal spasms and vomiting per pt  . Aspirin Other (See Comments)    "Stomach pain"    Antimicrobials this admission: 7/18 vanc >> 7/18 cefepime >> 7/18 flagyl x 1 Dose adjustments this admission:   Microbiology results: 7/18 BCx:  7/18 UCx:    Thank you for allowing pharmacy to be a part of this patient's care.  Arley Phenixllen Reginold Beale RPh 03/23/2018, 11:40 PM Pager [phone]

## 2018-03-23 NOTE — Progress Notes (Signed)
Subjective: 3 Days Post-Op Procedure(s) (LRB): RIGHT TOTAL HIP ARTHROPLASTY ANTERIOR APPROACH (Right) Patient reports pain as mild.  Slightly nauseous this am. No vomiting.  No lightheadedness/dizziness, chest pain/sob   Objective: Vital signs in last 24 hours: Temp:  [97.9 F (36.6 C)-99.5 F (37.5 C)] 99.5 F (37.5 C) (07/18 0612) Pulse Rate:  [84-92] 92 (07/18 0612) Resp:  [16] 16 (07/18 0612) BP: (111-124)/(53-57) 116/54 (07/18 0612) SpO2:  [95 %-99 %] 95 % (07/18 0612)  Intake/Output from previous day: 07/17 0701 - 07/18 0700 In: 480 [P.O.:480] Out: -  Intake/Output this shift: No intake/output data recorded.  Recent Labs    03/21/18 0615  HGB 9.6*   Recent Labs    03/21/18 0615  WBC 13.2*  RBC 3.22*  HCT 29.6*  PLT 225   Recent Labs    03/21/18 0615  NA 140  K 3.7  CL 106  CO2 28  BUN 17  CREATININE 0.89  GLUCOSE 218*  CALCIUM 8.4*   No results for input(s): LABPT, INR in the last 72 hours.  Neurologically intact Neurovascular intact Sensation intact distally Intact pulses distally Dorsiflexion/Plantar flexion intact Incision: dressing C/D/I No cellulitis present Compartment soft    Assessment/Plan: 3 Days Post-Op Procedure(s) (LRB): RIGHT TOTAL HIP ARTHROPLASTY ANTERIOR APPROACH (Right) Advance diet Up with therapy D/C IV fluids Discharge to SNF today WBAT RLE-no precautions ABLA-mild and stable    Cristie HemMary L Camani Sesay 03/23/2018, 7:42 AM

## 2018-03-23 NOTE — ED Notes (Signed)
ED TO INPATIENT HANDOFF REPORT  Name/Age/Gender Anne Terrell 77 y.o. female  Code Status    Code Status Orders  (From admission, onward)        Start     Ordered   03/23/18 2159  Full code  Continuous     03/23/18 2201    Code Status History    Date Active Date Inactive Code Status Order ID Comments User Context   03/20/2018 1432 03/23/2018 1520 Full Code [id number]  Leandrew Koyanagi, MD Inpatient    Advance Directive Documentation     Most Recent Value  Type of Advance Directive  Healthcare Power of Attorney, Living will  Pre-existing out of facility DNR order (yellow form or pink MOST form)  -  "MOST" Form in Place?  -      Home/SNF/Other Nursing Home  Chief Complaint Fever  Level of Care/Admitting Diagnosis ED Disposition    ED Disposition Condition [place]: [place] [[id number]]  Level of Care: Telemetry [5]  Admit to tele based on following criteria: Monitor for Ischemic changes  Diagnosis: SIRS (systemic inflammatory response syndrome) [facility]) [620355]  Admitting Physician: Rise Patience [phone]  Attending Physician: Rise Patience [email or web address]  PT Class (Do Not Modify): Observation [104]  PT Acc Code (Do Not Modify): Observation [10022]       Medical History Past Medical History:  Diagnosis Date  . Anxiety   . Arthralgia   . Carpal tunnel syndrome   . DDD (degenerative disc disease), cervical   . DDD (degenerative disc disease), lumbar   . Depression   . Full dentures   . GERD (gastroesophageal reflux disease)   . History of hiatal hernia   . Migraine headache   . Myalgia   . Myofascial pain   . Osteoarthritis   . Primary localized osteoarthritis of right hip   . Right rotator cuff tear   . Spondylosis   . Varicose veins   . Wears glasses     Allergies Allergies  Allergen Reactions  . Penicillins Other (See Comments)    PATIENT HAS HAD A PCN REACTION WITH IMMEDIATE RASH,  FACIAL/TONGUE/THROAT SWELLING, SOB, OR LIGHTHEADEDNESS WITH HYPOTENSION:  #  #  YES  #  # Has patient had a PCN reaction causing severe rash involving mucus membranes or skin necrosis: No Has patient had a PCN reaction that required hospitalization: No Has patient had a PCN reaction occurring within the last 10 years: No If all of the above answers are "NO", then may proceed with Cephalosporin use.  . Shellfish Allergy Nausea And Vomiting and Other (See Comments)    Esophageal spasms and vomiting per pt  . Aspirin Other (See Comments)    "Stomach pain"    IV Location/Drains/Wounds Patient Lines/Drains/Airways Status   Active Line/Drains/Airways    Name:   Placement date:   Placement time:   Site:   Days:   Peripheral IV 03/23/18 Right Wrist   03/23/18    1559    Wrist   less than 1   Incision (Closed) 03/20/18 Hip Right   03/20/18    1216     3          Labs/Imaging Results for orders placed or performed during the hospital encounter of 03/23/18 (from the past 48 hour(s))  Comprehensive metabolic panel     Status: Abnormal   Collection Time: 03/23/18  4:00 PM  Result Value Ref Range   Sodium 143 135 -  145 mmol/L   Potassium 4.3 3.5 - 5.1 mmol/L   Chloride 101 98 - 111 mmol/L    Comment: Please note change in reference range.   CO2 35 (H) 22 - 32 mmol/L   Glucose, Bld 99 70 - 99 mg/dL    Comment: Please note change in reference range.   BUN 16 8 - 23 mg/dL    Comment: Please note change in reference range.   Creatinine, Ser 0.82 0.44 - 1.00 mg/dL   Calcium 8.6 (L) 8.9 - 10.3 mg/dL   Total Protein 5.5 (L) 6.5 - 8.1 g/dL   Albumin 2.9 (L) 3.5 - 5.0 g/dL   AST 30 15 - 41 U/L   ALT 13 0 - 44 U/L    Comment: Please note change in reference range.   Alkaline Phosphatase 52 38 - 126 U/L   Total Bilirubin 0.2 (L) 0.3 - 1.2 mg/dL   GFR calc non Af Amer >60 >60 mL/min   GFR calc Af Amer >60 >60 mL/min    Comment: (NOTE) The eGFR has been calculated using the CKD EPI  equation. This calculation has not been validated in all clinical situations. eGFR's persistently <60 mL/min signify possible Chronic Kidney Disease.    Anion gap 7 5 - 15    Comment: Performed at [facility], [place] [address] [place], [place] [address]  CBC with Differential     Status: Abnormal   Collection Time: 03/23/18  4:00 PM  Result Value Ref Range   WBC 10.0 4.0 - 10.5 K/uL   RBC 3.23 (L) 3.87 - 5.11 MIL/uL   Hemoglobin 9.6 (L) 12.0 - 15.0 g/dL   HCT 30.2 (L) 36.0 - 46.0 %   MCV 93.5 78.0 - 100.0 fL   MCH 29.7 26.0 - 34.0 pg   MCHC 31.8 30.0 - 36.0 g/dL   RDW 13.9 11.5 - 15.5 %   Platelets 250 150 - 400 K/uL   Neutrophils Relative % 72 %   Neutro Abs 7.2 1.7 - 7.7 K/uL   Lymphocytes Relative 17 %   Lymphs Abs 1.7 0.7 - 4.0 K/uL   Monocytes Relative 9 %   Monocytes Absolute 0.9 0.1 - 1.0 K/uL   Eosinophils Relative 2 %   Eosinophils Absolute 0.2 0.0 - 0.7 K/uL   Basophils Relative 0 %   Basophils Absolute 0.0 0.0 - 0.1 K/uL    Comment: Performed at [facility], [place] [address], [place], [place] [address]  Protime-INR     Status: None   Collection Time: 03/23/18  4:00 PM  Result Value Ref Range   Prothrombin Time 14.6 11.4 - 15.2 seconds   INR 1.15     Comment: Performed at [facility], [place] [address], [place], [place] [address]  Culture, blood (Routine x 2)     Status: None (Preliminary result)   Collection Time: 03/23/18  4:00 PM  Result Value Ref Range   Specimen Description      BLOOD LEFT ANTECUBITAL Performed at [place] [address], [place], [place] [address]    Special Requests      BOTTLES DRAWN AEROBIC AND ANAEROBIC Blood Culture adequate volume Performed at [place] [address] [place],  [address]    Culture PENDING    Report Status PENDING   I-Stat CG4 Lactic Acid, ED     Status: None   Collection Time: 03/23/18  4:05 PM  Result Value Ref  Range   Lactic Acid, Venous 1.29 0.5 - 1.9 mmol/L  Culture, blood (Routine x 2)     Status: None (Preliminary result)   Collection Time: 03/23/18  4:05 PM  Result Value Ref Range   Specimen Description      BLOOD RIGHT ANTECUBITAL Performed at [facility], [place] [address], [name], [place] [address]    Special Requests      BOTTLES DRAWN AEROBIC AND ANAEROBIC Blood Culture adequate volume Performed at [place] [address] [place], [place] [address]    Culture PENDING    Report Status PENDING   Urinalysis, Routine w reflex microscopic     Status: Abnormal   Collection Time: 03/23/18  6:10 PM  Result Value Ref Range   Color, Urine STRAW (A) YELLOW   APPearance CLEAR CLEAR   Specific Gravity, Urine 1.006 1.005 - 1.030   pH 6.0 5.0 - 8.0   Glucose, UA NEGATIVE NEGATIVE mg/dL   Hgb urine dipstick MODERATE (A) NEGATIVE   Bilirubin Urine NEGATIVE NEGATIVE   Ketones, ur NEGATIVE NEGATIVE mg/dL   Protein, ur NEGATIVE NEGATIVE mg/dL   Nitrite NEGATIVE NEGATIVE   Leukocytes, UA NEGATIVE NEGATIVE   RBC / HPF 0-5 0 - 5 RBC/hpf   WBC, UA 0-5 0 - 5 WBC/hpf   Bacteria, UA NONE SEEN NONE SEEN   Mucus PRESENT     Comment: Performed at [facility], [place] [address], [place], [place] [address]  I-Stat CG4 Lactic Acid, ED     Status: None   Collection Time: 03/23/18  6:50 PM  Result Value Ref Range   Lactic Acid, Venous 0.79 0.5 - 1.9 mmol/L   Dg Chest 2 View  Result Date: 03/23/2018 CLINICAL DATA:  Fever, hip pain EXAM: CHEST - 2 VIEW COMPARISON:  03/02/2018 FINDINGS: Heart and mediastinal contours are within normal limits. No focal opacities or effusions. No acute bony abnormality. Spinal stimulator wires noted in the midthoracic region, stable. IMPRESSION: No active cardiopulmonary disease. Electronically Signed   By: Rolm Baptise M.D.   On: 03/23/2018 18:46   Dg Hip Unilat W Or Wo Pelvis 2-3 Views Right  Result Date: 03/23/2018 CLINICAL  DATA:  Fever, hip pain, recent hip replacement EXAM: DG HIP (WITH OR WITHOUT PELVIS) 2-3V RIGHT COMPARISON:  03/20/2017 FINDINGS: Changes of right hip replacement. No hardware or bony complicating feature. No acute bony abnormality. Soft tissue swelling and soft tissue gas noted laterally, likely postoperative. IMPRESSION: Right hip replacement.  No hardware or bony complicating feature. Electronically Signed   By: Rolm Baptise M.D.   On: 03/23/2018 18:45    Pending Labs Unresulted Labs (From admission, onward)   Start     Ordered   03/30/18 0500  Creatinine, serum  (enoxaparin (LOVENOX)    CrCl >/= 30 ml/min)  Weekly,   R    Comments:  while on enoxaparin therapy    03/23/18 2201   03/24/18 9357  Basic metabolic panel  Tomorrow morning,   R     03/23/18 2201   03/24/18 0500  CBC  Tomorrow morning,   R     03/23/18 2201   03/23/18 2200  Type and screen [place]  Once,   R    Comments:  [place]    03/23/18 2201   03/23/18 2159  CBC  (enoxaparin (LOVENOX)    CrCl >/= 30 ml/min)  Once,   R    Comments:  Baseline  for enoxaparin therapy IF NOT ALREADY DRAWN.  Notify MD if PLT < 100 K.    03/23/18 2201   03/23/18 2159  Creatinine, serum  (enoxaparin (LOVENOX)    CrCl >/= 30 ml/min)  Once,   R    Comments:  Baseline for enoxaparin therapy IF NOT ALREADY DRAWN.    03/23/18 2201   03/23/18 1612  Urine culture  STAT,   STAT     03/23/18 1612      Vitals/Pain Today's Vitals   03/23/18 1900 03/23/18 1958 03/23/18 2100 03/23/18 2130  BP: (!) 123/58 (!) 122/53 (!) 145/60 (!) 148/60  Pulse: (!) 101 98 94 (!) 101  Resp: _0 Temp:  98.4 F (36.9 C)    TempSrc:  Oral    SpO2: 98% 98% 96% 98%  Weight:      Height:      PainSc:  4       Isolation Precautions No active isolations  Medications Medications  methocarbamol (ROBAXIN) tablet 750 mg (has no administration in time range)  senna-docusate (Senokot-S) tablet 1 tablet (has no  administration in time range)  traZODone (DESYREL) tablet 50 mg (has no administration in time range)  acetaminophen (TYLENOL) tablet 650 mg (has no administration in time range)    Or  acetaminophen (TYLENOL) suppository 650 mg (has no administration in time range)  ondansetron (ZOFRAN) tablet 4 mg (has no administration in time range)    Or  ondansetron (ZOFRAN) injection 4 mg (has no administration in time range)  enoxaparin (LOVENOX) injection 40 mg (has no administration in time range)  0.9 %  sodium chloride infusion (has no administration in time range)  sodium chloride 0.9 % bolus 1,000 mL (0 mLs Intravenous Stopped 03/23/18 1700)    And  sodium chloride 0.9 % bolus 500 mL (0 mLs Intravenous Stopped 03/23/18 1659)    And  sodium chloride 0.9 % bolus 250 mL (0 mLs Intravenous Stopped 03/23/18 1642)  vancomycin (VANCOCIN) IVPB 1000 mg/200 mL premix (0 mg Intravenous Stopped 03/23/18 1744)  ceFEPIme (MAXIPIME) 2 g in sodium chloride 0.9 % 100 mL IVPB (0 g Intravenous Stopped 03/23/18 1700)    Mobility walks with device but not currently

## 2018-03-23 NOTE — H&P (Signed)
History and Physical    Anne Terrell WUJ:[id number] DOB: 18-Mar-1941 DOA: 03/23/2018  PCP: Jettie Pagan, NP  Patient coming from: Skilled nursing facility.  Chief Complaint: Fever and chills.  HPI: Anne Terrell is a 77 y.o. female with no significant past medical history who had chest recent right hip surgery and was discharged to rehab started experiencing fever and chills this morning.  Denies any chest pain productive cough shortness of breath nausea vomiting abdominal pain or diarrhea.  Mild erythema was noted around the right hip area where patient had surgery.  ED Course: In the ER patient was febrile with temperatures around 101 F mildly tachycardic.  Chest x-ray was unremarkable and UA was unremarkable.  On-call orthopedic surgeon was consulted and they at this time feel that patient does not have any definite signs of infection of the surgical site.  Patient was started on empiric antibiotics after blood cultures obtained admitted for further observation.  Review of Systems: As per HPI, rest all negative.   Past Medical History:  Diagnosis Date  . Anxiety   . Arthralgia   . Carpal tunnel syndrome   . DDD (degenerative disc disease), cervical   . DDD (degenerative disc disease), lumbar   . Depression   . Full dentures   . GERD (gastroesophageal reflux disease)   . History of hiatal hernia   . Migraine headache   . Myalgia   . Myofascial pain   . Osteoarthritis   . Primary localized osteoarthritis of right hip   . Right rotator cuff tear   . Spondylosis   . Varicose veins   . Wears glasses     Past Surgical History:  Procedure Laterality Date  . ABDOMINAL HYSTERECTOMY  1988  . APPENDECTOMY    . CARPAL TUNNEL RELEASE Bilateral   . COLONOSCOPY WITH ESOPHAGOGASTRODUODENOSCOPY (EGD)    . dental posts     holds bottom dentures in place  . DILATION AND CURETTAGE OF UTERUS    . FRACTURE SURGERY Left    LLE  . MULTIPLE TOOTH EXTRACTIONS    . SPINAL CORD  STIMULATOR INSERTION     located near the right hip and waist area  . TONSILLECTOMY    . TOTAL HIP ARTHROPLASTY Right 03/20/2018   Procedure: RIGHT TOTAL HIP ARTHROPLASTY ANTERIOR APPROACH;  Surgeon: Tarry Kos, MD;  Location: [facility];  Service: Orthopedics;  Laterality: Right;     reports that she quit smoking about 13 years ago. Her smoking use included cigarettes. She has never used smokeless tobacco. She reports that she does not drink alcohol or use drugs.  Allergies  Allergen Reactions  . Penicillins Other (See Comments)    PATIENT HAS HAD A PCN REACTION WITH IMMEDIATE RASH, FACIAL/TONGUE/THROAT SWELLING, SOB, OR LIGHTHEADEDNESS WITH HYPOTENSION:  #  #  YES  #  # Has patient had a PCN reaction causing severe rash involving mucus membranes or skin necrosis: No Has patient had a PCN reaction that required hospitalization: No Has patient had a PCN reaction occurring within the last 10 years: No If all of the above answers are "NO", then may proceed with Cephalosporin use.  . Shellfish Allergy Nausea And Vomiting and Other (See Comments)    Esophageal spasms and vomiting per pt  . Aspirin Other (See Comments)    "Stomach pain"    Family History  Problem Relation Age of Onset  . Diabetes Mother   . Varicose Veins Mother   . Asthma Mother   .  Heart defect Sister     Prior to Admission medications   Medication Sig Start Date End Date Taking? Authorizing Provider  oxyCODONE (OXY IR/ROXICODONE) 5 MG immediate release tablet Take 5 mg by mouth every 8 (eight) hours as needed for severe pain (tid prn).   Yes [provider]  traZODone (DESYREL) 50 MG tablet TAKE 1 TABLET AT BEDTIME 11/02/17  Yes Plovsky, Earvin HansenGerald, MD  methocarbamol (ROBAXIN) 750 MG tablet Take 1 tablet (750 mg total) by mouth 2 (two) times daily as needed for muscle spasms. 03/20/18   Tarry KosXu, Naiping M, MD  ondansetron (ZOFRAN) 4 MG tablet Take 1-2 tablets (4-8 mg total) by mouth every 8 (eight) hours as needed for  nausea or vomiting. 03/20/18   Tarry KosXu, Naiping M, MD  oxyCODONE (OXY IR/ROXICODONE) 5 MG immediate release tablet Take 1-3 tablets (5-15 mg total) by mouth every 4 (four) hours as needed. 03/20/18   Tarry KosXu, Naiping M, MD  oxyCODONE (OXYCONTIN) 10 mg 12 hr tablet Take 1 tablet (10 mg total) by mouth every 12 (twelve) hours for 3 days. 03/20/18 03/23/18  Tarry KosXu, Naiping M, MD  promethazine (PHENERGAN) 25 MG tablet Take 1 tablet (25 mg total) by mouth every 6 (six) hours as needed for nausea. 03/20/18   Tarry KosXu, Naiping M, MD  rivaroxaban (XARELTO) 10 MG TABS tablet Take 1 tablet (10 mg total) by mouth daily. 03/20/18   Tarry KosXu, Naiping M, MD  senna-docusate (SENOKOT S) 8.6-50 MG tablet Take 1 tablet by mouth at bedtime as needed. 03/20/18   Tarry KosXu, Naiping M, MD  venlafaxine XR (EFFEXOR-XR) 37.5 MG 24 hr capsule 1  qam  For  2  weeks Patient not taking: Reported on 03/23/2018 01/06/18   Archer AsaPlovsky, Gerald, MD    Physical Exam: Vitals:   03/23/18 1813 03/23/18 1855 03/23/18 1900 03/23/18 1958  BP: 134/60 125/68 (!) 123/58 (!) 122/53  Pulse: 95 94 (!) 101 98  Resp: 15 19 19 19   Temp: 98.7 F (37.1 C)   98.4 F (36.9 C)  TempSrc: Oral   Oral  SpO2: 99% 95% 98% 98%  Weight:      Height:          Constitutional: Moderately built and nourished. Vitals:   03/23/18 1813 03/23/18 1855 03/23/18 1900 03/23/18 1958  BP: 134/60 125/68 (!) 123/58 (!) 122/53  Pulse: 95 94 (!) 101 98  Resp: 15 19 19 19   Temp: 98.7 F (37.1 C)   98.4 F (36.9 C)  TempSrc: Oral   Oral  SpO2: 99% 95% 98% 98%  Weight:      Height:       Eyes: Anicteric no pallor. ENMT: No discharge from the ears eyes nose or mouth. Neck: No mass felt.  No neck rigidity but no JVD appreciated. Respiratory: No rhonchi or crepitations. Cardiovascular: S1-S2 heard no murmurs appreciated. Abdomen: Soft nontender bowel sounds present. Musculoskeletal: Mild erythema of the right hip area where patient had surgery. Skin: Mild erythema of the right hip area where patient  had surgery. Neurologic: Alert awake oriented to time place and person.  Moves all extremities. Psychiatric: Appears normal per normal affect.   Labs on Admission: I have personally reviewed following labs and imaging studies  CBC: Recent Labs  Lab 03/21/18 0615 03/23/18 1600  WBC 13.2* 10.0  NEUTROABS  --  7.2  HGB 9.6* 9.6*  HCT 29.6* 30.2*  MCV 91.9 93.5  PLT 225 250   Basic Metabolic Panel: Recent Labs  Lab 03/21/18 0615 03/23/18 1600  NA 140 143  K 3.7 4.3  CL 106 101  CO2 28 35*  GLUCOSE 218* 99  BUN 17 16  CREATININE 0.89 0.82  CALCIUM 8.4* 8.6*   GFR: Estimated Creatinine Clearance: 49 mL/min (by C-G formula based on SCr of 0.82 mg/dL). Liver Function Tests: Recent Labs  Lab 03/23/18 1600  AST 30  ALT 13  ALKPHOS 52  BILITOT 0.2*  PROT 5.5*  ALBUMIN 2.9*   No results for input(s): LIPASE, AMYLASE in the last 168 hours. No results for input(s): AMMONIA in the last 168 hours. Coagulation Profile: Recent Labs  Lab 03/23/18 1600  INR 1.15   Cardiac Enzymes: No results for input(s): CKTOTAL, CKMB, CKMBINDEX, TROPONINI in the last 168 hours. BNP (last 3 results) No results for input(s): PROBNP in the last 8760 hours. HbA1C: No results for input(s): HGBA1C in the last 72 hours. CBG: No results for input(s): GLUCAP in the last 168 hours. Lipid Profile: No results for input(s): CHOL, HDL, LDLCALC, TRIG, CHOLHDL, LDLDIRECT in the last 72 hours. Thyroid Function Tests: No results for input(s): TSH, T4TOTAL, FREET4, T3FREE, THYROIDAB in the last 72 hours. Anemia Panel: No results for input(s): VITAMINB12, FOLATE, FERRITIN, TIBC, IRON, RETICCTPCT in the last 72 hours. Urine analysis:    Component Value Date/Time   COLORURINE STRAW (A) 03/23/2018 1810   APPEARANCEUR CLEAR 03/23/2018 1810   LABSPEC 1.006 03/23/2018 1810   PHURINE 6.0 03/23/2018 1810   GLUCOSEU NEGATIVE 03/23/2018 1810   HGBUR MODERATE (A) 03/23/2018 1810   BILIRUBINUR NEGATIVE  03/23/2018 1810   KETONESUR NEGATIVE 03/23/2018 1810   PROTEINUR NEGATIVE 03/23/2018 1810   NITRITE NEGATIVE 03/23/2018 1810   LEUKOCYTESUR NEGATIVE 03/23/2018 1810   Sepsis Labs: @LABRCNTIP (procalcitonin:4,lacticidven:4) )No results found for this or any previous visit (from the past 240 hour(s)).   Radiological Exams on Admission: Dg Chest 2 View  Result Date: 03/23/2018 CLINICAL DATA:  Fever, hip pain EXAM: CHEST - 2 VIEW COMPARISON:  03/02/2018 FINDINGS: Heart and mediastinal contours are within normal limits. No focal opacities or effusions. No acute bony abnormality. Spinal stimulator wires noted in the midthoracic region, stable. IMPRESSION: No active cardiopulmonary disease. Electronically Signed   By: Charlett Nose M.D.   On: 03/23/2018 18:46   Dg Hip Unilat W Or Wo Pelvis 2-3 Views Right  Result Date: 03/23/2018 CLINICAL DATA:  Fever, hip pain, recent hip replacement EXAM: DG HIP (WITH OR WITHOUT PELVIS) 2-3V RIGHT COMPARISON:  03/20/2017 FINDINGS: Changes of right hip replacement. No hardware or bony complicating feature. No acute bony abnormality. Soft tissue swelling and soft tissue gas noted laterally, likely postoperative. IMPRESSION: Right hip replacement.  No hardware or bony complicating feature. Electronically Signed   By: Charlett Nose M.D.   On: 03/23/2018 18:45     Assessment/Plan Principal Problem:   SIRS (systemic inflammatory response syndrome) (HCC) Active Problems:   Acute blood loss anemia    1.  SIRS -at this time orthopedics feel the patient's surgical site is noninfected.  On exam there is mild erythema around the surgical sites.  Closely observe.  Follow blood cultures for now kept patient on empiric antibiotics. 2. Anemia likely due to recent blood loss from surgery follow CBC.   DVT prophylaxis: Lovenox. Code Status: Full code. Family Communication: Discussed with patient. Disposition Plan: Back to rehab. Consults called: Orthopedics. Admission  status: Observation.   Eduard Clos MD [facility] Pager [phone].  If 7PM-7AM, please contact night-coverage [email or web address] Password [facility]  03/23/2018, 10:02 PM

## 2018-03-23 NOTE — Progress Notes (Signed)
I evaluated the patient tonight in the ED.   her incision is completely clean dry and intact without any drainage or signs of infection.  She has no peri-incisional erythema or cellulitis.  The redness around the posterior lateral aspect of the thigh and buttock is consistent with dependent edema.  She has some small areas of petechiae that are consistent with postsurgical bleeding and hematoma.  I moved her hip through full range of motion and she reported no pain.  From my standpoint I have a low suspicion of cellulitis or infection related to her recent surgery as it was only 3 days ago which would be too early for a surgical site infection.  I would do a full medical work-up to evaluate for other sources of her SIRS.  We will follow along while she is in the hospital.

## 2018-03-24 DIAGNOSIS — R6510 Systemic inflammatory response syndrome (SIRS) of non-infectious origin without acute organ dysfunction: Secondary | ICD-10-CM

## 2018-03-24 LAB — BASIC METABOLIC PANEL
Anion gap: 7 (ref 5–15)
BUN: 14 mg/dL (ref 8–23)
CO2: 32 mmol/L (ref 22–32)
Calcium: 8.4 mg/dL — ABNORMAL LOW (ref 8.9–10.3)
Chloride: 104 mmol/L (ref 98–111)
Creatinine, Ser: 0.68 mg/dL (ref 0.44–1.00)
GFR calc Af Amer: >60 mL/min (ref >60)
GFR calc non Af Amer: >60 mL/min (ref >60)
Glucose, Bld: 99 mg/dL (ref 70–99)
Potassium: 4.3 mmol/L (ref 3.5–5.1)
Sodium: 143 mmol/L (ref 135–145)

## 2018-03-24 LAB — CBC
HCT: 27.8 % — ABNORMAL LOW (ref 36.0–46.0)
Hemoglobin: 9 g/dL — ABNORMAL LOW (ref 12.0–15.0)
MCH: 29.8 pg (ref 26.0–34.0)
MCHC: 32.4 g/dL (ref 30.0–36.0)
MCV: 92.1 fL (ref 78.0–100.0)
PLATELETS: 253 10*3/uL (ref 150–400)
RBC: 3.02 MIL/uL — AB (ref 3.87–5.11)
RDW: 13.7 % (ref 11.5–15.5)
WBC: 9.3 10*3/uL (ref 4.0–10.5)

## 2018-03-24 LAB — TYPE AND SCREEN
ABO/RH(D): A POS
Antibody Screen: NEGATIVE

## 2018-03-24 MED ORDER — OXYCODONE HCL 5 MG PO TABS
10.0000 mg | ORAL_TABLET | Freq: Once | ORAL | Status: AC
  Administered 2018-03-24: 10 mg via ORAL
  Filled 2018-03-24: qty 2

## 2018-03-24 MED ORDER — DOXYCYCLINE HYCLATE 50 MG PO CAPS: 50.0000 mg | ORAL_CAPSULE | Freq: Two times a day (BID) | ORAL | Status: DC

## 2018-03-24 NOTE — Progress Notes (Signed)
Patient discharged to [facility] on [facility]18 and was readmitted on 7/18 after experiencing fever at SNF. CSW spoke with patient at bedside who confirmed plan [facility] to [facility] ST rehab. Patient became tearful when speaking with CSW about discharge and readmission, CSW provided emotional support.   [facility] staff confirmed patient's ability to return. Facility aware of patient's discharge. [facility] contacted, patient aware. Patient's RN can call report to [phone] Room 601P, packet complete. CSW signing off, no other needs identified at this time.  Celso SickleKimberly Tamkia Temples, [place] Clinical Social Worker [facility] Cell#: [phone]

## 2018-03-24 NOTE — Discharge Summary (Signed)
Physician Discharge Summary  Anne Terrell NWG:[id number] DOB: 10-27-1940 DOA: 03/23/2018  PCP: Jettie Pagan, NP  Admit date: 03/23/2018 Discharge date: 03/24/2018  Time spent: 33 minutes  Recommendations for Outpatient Follow-up:  1. Continue rehab at [facility] 2. Follow-up instructions by orthopedics 3. Continue doxycycline empirically   Discharge Diagnoses:  Principal Problem:   SIRS (systemic inflammatory response syndrome) (HCC) Active Problems:   Acute blood loss anemia   Discharge Condition: Good  Diet recommendation: Heart healthy diet  Filed Weights   03/23/18 1528 03/23/18 2303  Weight: 57.6 kg (127 lb) 65.7 kg (144 lb 13.5 oz)    History of present illness:  Anne Terrell is a 77 y.o. female with no significant past medical history who had chest recent right hip surgery and was discharged to rehab started experiencing fever and chills this morning.  Denies any chest pain productive cough shortness of breath nausea vomiting abdominal pain or diarrhea.  Mild erythema was noted around the right hip area where patient had surgery.    Hospital Course:  Patient was admitted from the ER with suspected infection of the surgical site.  It turned out not to be infection.  Orthopedics has seen the patient.  Fever is completely gone in the moment.  The surgical site is red but according to orthopedics this is expected.  Patient has good response with cefepime and vancomycin.  We will transfer patient back to skilled facility on oral doxycycline.  Continue rehab until patient is ready to go back home.  Resume previous orders including pain medications.  Procedures: None  Consultations:  Jed Limerick, Orthopedics  Discharge Exam: Vitals:   03/23/18 2313 03/24/18 0433  BP: (!) 142/55 123/72  Pulse: 93 96  Resp: 18 18  Temp: 99.1 F (37.3 C) 98.5 F (36.9 C)  SpO2: 100% 95%    General: Stable with no acute findings Cardiovascular: Regular rate and  rhythm Respiratory: Good air entry bilaterally no significant wheeze rest or crackles Extremities: Redness around the surgical site but no significant swelling or tenderness  Discharge Instructions   Discharge Instructions    Diet - low sodium heart healthy   Complete by:  As directed    Increase activity slowly   Complete by:  As directed      Allergies as of 03/24/2018      Reactions   Penicillins Other (See Comments)   PATIENT HAS HAD A PCN REACTION WITH IMMEDIATE RASH, FACIAL/TONGUE/THROAT SWELLING, SOB, OR LIGHTHEADEDNESS WITH HYPOTENSION:  #  #  YES  #  # Has patient had a PCN reaction causing severe rash involving mucus membranes or skin necrosis: No Has patient had a PCN reaction that required hospitalization: No Has patient had a PCN reaction occurring within the last 10 years: No If all of the above answers are "NO", then may proceed with Cephalosporin use.   Shellfish Allergy Nausea And Vomiting, Other (See Comments)   Esophageal spasms and vomiting per pt   Aspirin Other (See Comments)   "Stomach pain"      Medication List    TAKE these medications   doxycycline 50 MG capsule Commonly known as:  VIBRAMYCIN Take 1 capsule (50 mg total) by mouth 2 (two) times daily.   methocarbamol 750 MG tablet Commonly known as:  ROBAXIN Take 1 tablet (750 mg total) by mouth 2 (two) times daily as needed for muscle spasms.   ondansetron 4 MG tablet Commonly known as:  ZOFRAN Take 1-2 tablets (4-8 mg total)  by mouth every 8 (eight) hours as needed for nausea or vomiting.   oxyCODONE 5 MG immediate release tablet Commonly known as:  Oxy IR/ROXICODONE Take 5 mg by mouth every 8 (eight) hours as needed for severe pain (tid prn). What changed:  Another medication with the same name was removed. Continue taking this medication, and follow the directions you see here.   oxyCODONE 5 MG immediate release tablet Commonly known as:  Oxy IR/ROXICODONE Take 1-3 tablets (5-15 mg total)  by mouth every 4 (four) hours as needed. What changed:  Another medication with the same name was removed. Continue taking this medication, and follow the directions you see here.   promethazine 25 MG tablet Commonly known as:  PHENERGAN Take 1 tablet (25 mg total) by mouth every 6 (six) hours as needed for nausea.   rivaroxaban 10 MG Tabs tablet Commonly known as:  XARELTO Take 1 tablet (10 mg total) by mouth daily.   senna-docusate 8.6-50 MG tablet Commonly known as:  SENOKOT S Take 1 tablet by mouth at bedtime as needed.   traZODone 50 MG tablet Commonly known as:  DESYREL TAKE 1 TABLET AT BEDTIME   venlafaxine XR 37.5 MG 24 hr capsule Commonly known as:  EFFEXOR-XR 1  qam  For  2  weeks      Allergies  Allergen Reactions  . Penicillins Other (See Comments)    PATIENT HAS HAD A PCN REACTION WITH IMMEDIATE RASH, FACIAL/TONGUE/THROAT SWELLING, SOB, OR LIGHTHEADEDNESS WITH HYPOTENSION:  #  #  YES  #  # Has patient had a PCN reaction causing severe rash involving mucus membranes or skin necrosis: No Has patient had a PCN reaction that required hospitalization: No Has patient had a PCN reaction occurring within the last 10 years: No If all of the above answers are "NO", then may proceed with Cephalosporin use.  . Shellfish Allergy Nausea And Vomiting and Other (See Comments)    Esophageal spasms and vomiting per pt  . Aspirin Other (See Comments)    "Stomach pain"      The results of significant diagnostics from this hospitalization (including imaging, microbiology, ancillary and laboratory) are listed below for reference.    Significant Diagnostic Studies: Dg Chest 2 View  Result Date: 03/23/2018 CLINICAL DATA:  Fever, hip pain EXAM: CHEST - 2 VIEW COMPARISON:  03/02/2018 FINDINGS: Heart and mediastinal contours are within normal limits. No focal opacities or effusions. No acute bony abnormality. Spinal stimulator wires noted in the midthoracic region, stable. IMPRESSION:  No active cardiopulmonary disease. Electronically Signed   By: Charlett NoseKevin  Dover M.D.   On: 03/23/2018 18:46   Dg Chest 2 View  Result Date: 03/03/2018 CLINICAL DATA:  Osteoarthritis.  Hip replacement. EXAM: CHEST - 2 VIEW COMPARISON:  No recent prior. FINDINGS: Mediastinum and hilar structures normal. Lungs are clear. No pleural effusion or pneumothorax. Heart size normal. Thoracic spine neurostimulator noted. No acute bony abnormality. IMPRESSION: No acute cardiopulmonary disease. Electronically Signed   By: Maisie Fushomas  Register   On: 03/03/2018 07:10   Dg Pelvis Portable  Result Date: 03/20/2018 CLINICAL DATA:  77 year old female post right hip replacement. Initial encounter. EXAM: PORTABLE PELVIS 1-2 VIEWS COMPARISON:  02/28/2018. FINDINGS: Post total right hip replacement which appears in satisfactory position without complication noted on frontal projection. IMPRESSION: Post total right hip replacement. Electronically Signed   By: Lacy DuverneySteven  Olson M.D.   On: 03/20/2018 13:56   Dg C-arm 1-60 Min-no Report  Result Date: 03/20/2018 Fluoroscopy was utilized by  the requesting physician.  No radiographic interpretation.   Dg Hip Operative Unilat With Pelvis Right  Result Date: 03/20/2018 CLINICAL DATA:  77 year old female undergoing right total hip replacement. EXAM: OPERATIVE RIGHT HIP (WITH PELVIS IF PERFORMED) 4 VIEWS TECHNIQUE: Fluoroscopic spot image(s) were submitted for interpretation post-operatively. COMPARISON:  Right hip and pelvis series 02/28/2018. FINDINGS: Four intraoperative fluoroscopic spot views of the right hip and lower pelvis. On the final 2 images a bipolar right hip arthroplasty is in place. The hardware appears intact and normally aligned in the AP view. No unexpected osseous changes are identified. IMPRESSION: Right bipolar hip arthroplasty with normal AP alignment. Electronically Signed   By: Odessa Fleming M.D.   On: 03/20/2018 12:39   Dg Hip Unilat W Or Wo Pelvis 2-3 Views Right  Result  Date: 03/23/2018 CLINICAL DATA:  Fever, hip pain, recent hip replacement EXAM: DG HIP (WITH OR WITHOUT PELVIS) 2-3V RIGHT COMPARISON:  03/20/2017 FINDINGS: Changes of right hip replacement. No hardware or bony complicating feature. No acute bony abnormality. Soft tissue swelling and soft tissue gas noted laterally, likely postoperative. IMPRESSION: Right hip replacement.  No hardware or bony complicating feature. Electronically Signed   By: Charlett Nose M.D.   On: 03/23/2018 18:45   Xr Hip Unilat W Or W/o Pelvis 2-3 Views Right  Result Date: 02/28/2018 Advanced right hip degenerative joint disease with significant joint space narrowing and periarticular spurring.   Microbiology: Recent Results (from the past 240 hour(s))  Culture, blood (Routine x 2)     Status: None (Preliminary result)   Collection Time: 03/23/18  4:00 PM  Result Value Ref Range Status   Specimen Description   Final    BLOOD LEFT ANTECUBITAL Performed at [facility], [address], [place], [place] [address]    Special Requests   Final    BOTTLES DRAWN AEROBIC AND ANAEROBIC Blood Culture adequate volume Performed at [facility], [address] [place], [place] [address]    Culture   Final    NO GROWTH < 12 HOURS Performed at [facility], [address], [place], [place] [address]    Report Status PENDING  Incomplete  Culture, blood (Routine x 2)     Status: None (Preliminary result)   Collection Time: 03/23/18  4:05 PM  Result Value Ref Range Status   Specimen Description   Final    BLOOD RIGHT ANTECUBITAL Performed at [facility], [address], [place], [place] [address]    Special Requests   Final    BOTTLES DRAWN AEROBIC AND ANAEROBIC Blood Culture adequate volume Performed at [facility], [address], [place], [place] [address]    Culture   Final    NO GROWTH < 12 HOURS Performed at [facility], [address] [place], [place]  [address]    Report Status PENDING  Incomplete     Labs: Basic Metabolic Panel: Recent Labs  Lab 03/21/18 0615 03/23/18 1600 03/24/18 0534  NA 140 143 143  K 3.7 4.3 4.3  CL 106 101 104  CO2 28 35* 32  GLUCOSE 218* 99 99  BUN 17 16 14   CREATININE 0.89 0.82 0.68  CALCIUM 8.4* 8.6* 8.4*   Liver Function Tests: Recent Labs  Lab 03/23/18 1600  AST 30  ALT 13  ALKPHOS 52  BILITOT 0.2*  PROT 5.5*  ALBUMIN 2.9*   No results for input(s): LIPASE, AMYLASE in the last 168 hours.  No results for input(s): AMMONIA in the last 168 hours. CBC: Recent Labs  Lab 03/21/18 0615 03/23/18 1600 03/24/18 0534  WBC 13.2* 10.0 9.3  NEUTROABS  --  7.2  --   HGB 9.6* 9.6* 9.0*  HCT 29.6* 30.2* 27.8*  MCV 91.9 93.5 92.1  PLT 225 250 253   Cardiac Enzymes: No results for input(s): CKTOTAL, CKMB, CKMBINDEX, TROPONINI in the last 168 hours. BNP: BNP (last 3 results) No results for input(s): BNP in the last 8760 hours.  ProBNP (last 3 results) No results for input(s): PROBNP in the last 8760 hours.  CBG: No results for input(s): GLUCAP in the last 168 hours.     SignedLonia Blood MD.  [facility] 03/24/2018, 10:59 AM

## 2018-03-24 NOTE — Progress Notes (Signed)
Initial Nutrition Assessment  DOCUMENTATION CODES:   Not applicable  INTERVENTION:  - Continue Ensure Enlive BID, each supplement provides 350 kcal and 20 grams of protein - Continue to encourage PO intakes.   NUTRITION DIAGNOSIS:   Increased nutrient needs related to wound healing(for recent R hip surgery) as evidenced by estimated needs.  GOAL:   Patient will meet greater than or equal to 90% of their needs  MONITOR:   PO intake, Supplement acceptance, Weight trends, Labs, Skin  REASON FOR ASSESSMENT:   Malnutrition Screening Tool  ASSESSMENT:    77 y.o. female with no significant past medical history who had recent right hip surgery and was discharged to rehab on 7/18. She began experiencing fever and chills once she got to rehab. Denies any chest pain, productive cough, SOB, nausea, vomiting, abdominal pain, or diarrhea.  Mild erythema was noted around the right hip area where patient had surgery.  BMI indicates overweight status, appropriate for age. No intakes documented since admission. Patient was admitted 7/15- late morning of 7/18 and was eating 100% of meals during that time to include 100% of breakfast yesterday AM before d/c to rehab. Patient reports appetite is currently at baseline and that she is able to eat and drink without any chewing or swallowing difficulties. She is not currently experiencing any abdominal pain or nausea.    NFPE indicated no muscle and no fat wasting. Patient reported that she has lost 60 lbs in the past 2 years. This is unable to be confirmed based on weights in the chart (included below). Per review of Care Everywhere tab, [facility]. Based on current weight, this indicates that patient has lost 21 lbs (12.7% body weight) in the past 4 years; this is not significant for time frame.   Wt Readings from Last 10 Encounters:  03/23/18 144 lb 13.5 oz (65.7 kg)  03/20/18 130 lb 9.6 oz (59.2 kg)  03/02/18 130 lb 9.6 oz  (59.2 kg)  08/08/17 139 lb 6.4 oz (63.2 kg)  01/13/16 140 lb (63.5 kg)  08/08/14 150 lb (68 kg)  05/03/14 158 lb (71.7 kg)   Per Dr. Katherene PontoKakrakandy's note overnight/early this AM: patient with SIRS, at that time Surgery indicated that surgical site was not infected, anemia felt to be d/t blood loss during recent surgery.   Medications reviewed; 1 tablet Senokot/day.  Labs reviewed; Ca: 8.4 mg/dL. IVF: NS @ 100 mL/hr.      NUTRITION - FOCUSED PHYSICAL EXAM:  Completed; no muscle and no fat wasting noted.   Diet Order:   Diet Order           Diet regular Room service appropriate? Yes; Fluid consistency: Thin  Diet effective now          EDUCATION NEEDS:   No education needs have been identified at this time  Skin:  Skin Assessment: Skin Integrity Issues: Skin Integrity Issues:: Incisions Incisions: R hip (03/20/18)  Last BM:  7/14  Height:   Ht Readings from Last 1 Encounters:  03/23/18 5\' 3"  (1.6 m)    Weight:   Wt Readings from Last 1 Encounters:  03/23/18 144 lb 13.5 oz (65.7 kg)    Ideal Body Weight:  52.27 kg  BMI:  Body mass index is 25.66 kg/m.  Estimated Nutritional Needs:   Kcal:  1445-1645 (22-25 kcal/kg)  Protein:  66-79 grams (1-1.2 grams/kg)  Fluid:  >/= 1.6 L/day     Trenton GammonJessica Isbella Arline, MS, RD, LDN, CNSC Inpatient  Clinical Dietitian Pager # [phone] After hours/weekend pager # [phone]

## 2018-03-25 LAB — URINE CULTURE: CULTURE: NO GROWTH

## 2018-03-28 ENCOUNTER — Inpatient Hospital Stay (INDEPENDENT_AMBULATORY_CARE_PROVIDER_SITE_OTHER): Payer: Medicare Other | Admitting: Orthopaedic Surgery

## 2018-03-28 LAB — CULTURE, BLOOD (ROUTINE X 2)
CULTURE: NO GROWTH
Culture: NO GROWTH
Special Requests: ADEQUATE
Special Requests: ADEQUATE

## 2018-04-04 ENCOUNTER — Telehealth (INDEPENDENT_AMBULATORY_CARE_PROVIDER_SITE_OTHER): Payer: Self-pay | Admitting: Orthopaedic Surgery

## 2018-04-04 ENCOUNTER — Ambulatory Visit (INDEPENDENT_AMBULATORY_CARE_PROVIDER_SITE_OTHER): Payer: Medicare Other | Admitting: Orthopaedic Surgery

## 2018-04-04 DIAGNOSIS — Z96641 Presence of right artificial hip joint: Secondary | ICD-10-CM

## 2018-04-04 NOTE — Telephone Encounter (Signed)
FYI

## 2018-04-04 NOTE — Telephone Encounter (Signed)
Marcelino DusterMichelle -(OT) [facility] left voicemail message stating she called to schedule the HHOT eval but, patient declined the OT eval. Patient said she did not feel like [facility] and was preforming her ADL on her own. Marcelino DusterMichelle said she was calling to inform Dr Roda ShuttersXu. The number to contact Marcelino DusterMichelle is [phone]

## 2018-04-04 NOTE — Progress Notes (Signed)
Anne Terrell is two-week status post right total hip replacement.  She is doing well.  She states that she has significantly improved in terms of her redness and swelling.  Her surgical incisions clean dry intact without any signs of infection.  There is no drainage.  She has good right rotation of the hip without pain.  Leg lengths are equal.  From my standpoint she is doing very well.  She should continue to work with home health physical therapy.  She may discontinue Xarelto at this point.  Recheck in 4 weeks with standing AP pelvis.

## 2018-04-04 NOTE — Telephone Encounter (Signed)
[facility]  Marcelino DusterMichelle  [phone]   FYI other Patient declined occupational therapy evaluation

## 2018-04-04 NOTE — Telephone Encounter (Signed)
See message. FYI. 

## 2018-04-05 ENCOUNTER — Telehealth (INDEPENDENT_AMBULATORY_CARE_PROVIDER_SITE_OTHER): Payer: Self-pay | Admitting: Orthopaedic Surgery

## 2018-04-05 NOTE — Telephone Encounter (Signed)
yes

## 2018-04-05 NOTE — Telephone Encounter (Signed)
[name]

## 2018-04-05 NOTE — Telephone Encounter (Signed)
Is this okay?

## 2018-04-05 NOTE — Telephone Encounter (Signed)
Anne Terrell-(PT[facility] with [facility] called needing verbal orders for HHPT 2 wk 4. Anne Terrell asked if patient will be going to outpatient (PT) The number to contact Anne Terrell is [phone]

## 2018-04-12 ENCOUNTER — Ambulatory Visit (HOSPITAL_COMMUNITY): Payer: Self-pay | Admitting: Psychiatry

## 2018-05-02 ENCOUNTER — Ambulatory Visit (INDEPENDENT_AMBULATORY_CARE_PROVIDER_SITE_OTHER): Payer: Medicare Other

## 2018-05-02 ENCOUNTER — Ambulatory Visit (INDEPENDENT_AMBULATORY_CARE_PROVIDER_SITE_OTHER): Payer: Medicare Other | Admitting: Physician Assistant

## 2018-05-02 ENCOUNTER — Encounter (INDEPENDENT_AMBULATORY_CARE_PROVIDER_SITE_OTHER): Payer: Self-pay | Admitting: Orthopaedic Surgery

## 2018-05-02 DIAGNOSIS — M25561 Pain in right knee: Secondary | ICD-10-CM

## 2018-05-02 DIAGNOSIS — M1611 Unilateral primary osteoarthritis, right hip: Secondary | ICD-10-CM

## 2018-05-02 DIAGNOSIS — Z96641 Presence of right artificial hip joint: Secondary | ICD-10-CM

## 2018-05-02 NOTE — Progress Notes (Signed)
Post-Op Visit Note   Patient: Anne Terrell           Date of Birth: [birth year]           MRN: [id number] Visit Date: 05/02/2018 PCP: Jettie Pagan, NP   Assessment & Plan:  Chief Complaint:  Chief Complaint  Patient presents with  . Right Hip - Pain  . Right Knee - Pain   Visit Diagnoses:  1. Primary osteoarthritis of right hip   2. Status post right hip replacement   3. Acute pain of right knee     Plan: 6 week THA follow up plan  Patient presents for follow up 6 weeks status post total hip replacement.  She has finished formal therapy and is doing great.  She does have some pain over the iliotibial band at the knee.  The wound is healed and there is no evidence of infection. TED hose may be discontinued. Radiographs reveal a total hip arthroplasty in good position, with no evidence of subsidence, loosening, or complicating features.  I have provided the patient with iliotibial band stretches.  It was reinforced that with any procedure including dental work, colonoscopy, or any invasive procedure that pre-procedural prophylactic antibiotics must be taken to decrease the risk of infection. Reminders were given about signs to be aware of including redness, drainage, increased pain, fevers, calf pain, shortness of breath, or any concern should generate a phone call or a return to see [place] immediately. Will plan to follow up at 3 months postoperatively for next evaluation with radiographs at that time.  Follow-Up Instructions: Return in about 6 weeks (around 06/13/2018).   Orders:  Orders Placed This Encounter  Procedures  . XR Pelvis 1-2 Views  . XR KNEE 3 VIEW RIGHT   No orders of the defined types were placed in this encounter.   Imaging: Xr Knee 3 View Right  Result Date: 05/02/2018 Minimal degenerative changes medial compartment  Xr Pelvis 1-2 Views  Result Date: 05/02/2018 Stable alignment of the prosthesis   PMFS History: Patient Active Problem List   Diagnosis Date Noted  . SIRS (systemic inflammatory response syndrome) (HCC) 03/23/2018  . Acute blood loss anemia 03/23/2018  . Status post right hip replacement 03/20/2018  . Primary osteoarthritis of right hip   . 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 Varicose veins of lower extremities with other complications 05/03/2014  . Major depressive disorder, recurrent episode, unspecified 04/11/2013  . Major depressive disorder, single episode, moderate (HCC) 01/05/2013  . Major depressive disorder, recurrent episode, severe, without mention of psychotic behavior 11/15/2012   Past Medical History:  Diagnosis Date  . Anxiety   . Arthralgia   . Carpal tunnel syndrome   . DDD (degenerative disc disease), cervical   . DDD (degenerative disc disease), lumbar   . Depression   . Full dentures   . GERD (gastroesophageal reflux disease)   . History of hiatal hernia   . Migraine headache   . Myalgia   . Myofascial pain   . Osteoarthritis   . Primary localized osteoarthritis of right hip   . Right rotator cuff tear   . Spondylosis   . Varicose veins   . Wears glasses     Family History  Problem Relation Age of Onset  . Diabetes Mother   . Varicose Veins Mother   . Asthma Mother   . Heart defect Sister     Past Surgical History:  Procedure Laterality Date  . ABDOMINAL HYSTERECTOMY  1988  .  APPENDECTOMY    . CARPAL TUNNEL RELEASE Bilateral   . COLONOSCOPY WITH ESOPHAGOGASTRODUODENOSCOPY (EGD)    . dental posts     holds bottom dentures in place  . DILATION AND CURETTAGE OF UTERUS    . FRACTURE SURGERY Left    LLE  . MULTIPLE TOOTH EXTRACTIONS    . SPINAL CORD STIMULATOR INSERTION     located near the right hip and waist area  . TONSILLECTOMY    . TOTAL HIP ARTHROPLASTY Right 03/20/2018   Procedure: RIGHT TOTAL HIP ARTHROPLASTY ANTERIOR APPROACH;  Surgeon: Tarry KosXu, Naiping M, MD;  Location: [facility];  Service: Orthopedics;  Laterality: Right;   Social History   Occupational History  .  Not on file  Tobacco Use  . Smoking status: Former Smoker    Types: Cigarettes    Last attempt to quit: 05/03/2004    Years since quitting: 14.0  . Smokeless tobacco: Never Used  Substance and Sexual Activity  . Alcohol use: No    Alcohol/week: 0.0 standard drinks  . Drug use: No  . Sexual activity: Never

## 2018-06-21 ENCOUNTER — Ambulatory Visit (INDEPENDENT_AMBULATORY_CARE_PROVIDER_SITE_OTHER): Payer: Medicare Other | Admitting: Orthopaedic Surgery

## 2018-06-21 ENCOUNTER — Ambulatory Visit (INDEPENDENT_AMBULATORY_CARE_PROVIDER_SITE_OTHER): Payer: Medicare Other

## 2018-06-21 ENCOUNTER — Encounter (INDEPENDENT_AMBULATORY_CARE_PROVIDER_SITE_OTHER): Payer: Self-pay | Admitting: Orthopaedic Surgery

## 2018-06-21 DIAGNOSIS — Z96641 Presence of right artificial hip joint: Secondary | ICD-10-CM

## 2018-06-21 MED ORDER — DICLOFENAC SODIUM 1 % TD GEL: 2.0000 g | Freq: Four times a day (QID) | TRANSDERMAL | 5 refills | Status: DC

## 2018-06-21 NOTE — Progress Notes (Signed)
Post-Op Visit Note   Patient: Anne Terrell           Date of Birth: July 02, 1941           MRN: [id number] Visit Date: 06/21/2018 PCP: Jettie Pagan, NP   Assessment & Plan:  Chief Complaint:  Chief Complaint  Patient presents with  . Right Hip - Pain   Visit Diagnoses:  1. Status post right hip replacement     Plan: [name] is a little over 3 months status post right total hip replacement.  She is overall doing well.  She is complaining of some quadriceps tendinitis but overall she is doing well.  She reports no pain in her hip.  She has some mild groin pain is worse with hip flexion the based on exam is consistent with iliopsoas tendinitis.  Her x-rays demonstrate stable implant in good alignment.  She has resumed her normal activities and she is happy with her recovery.  From my standpoint I will see her back in 3 months with repeat standing AP pelvis and lateral hip.  Dental antibiotic prophylaxis was reinforced today.  Follow-Up Instructions: Return in about 3 months (around 09/21/2018).   Orders:  Orders Placed This Encounter  Procedures  . XR HIP UNILAT W OR W/O PELVIS 2-3 VIEWS RIGHT   Meds ordered this encounter  Medications  . DISCONTD: diclofenac sodium (VOLTAREN) 1 % GEL    Sig: Apply 2 g topically 4 (four) times daily.    Dispense:  1 Tube    Refill:  5  . diclofenac sodium (VOLTAREN) 1 % GEL    Sig: Apply 2 g topically 4 (four) times daily.    Dispense:  1 Tube    Refill:  5    Imaging: Xr Hip Unilat W Or W/o Pelvis 2-3 Views Right  Result Date: 06/21/2018 Stable right total hip replacement.   PMFS History: Patient Active Problem List   Diagnosis Date Noted  . SIRS (systemic inflammatory response syndrome) (HCC) 03/23/2018  . Acute blood loss anemia 03/23/2018  . Status post right hip replacement 03/20/2018  . Primary osteoarthritis of right hip   . 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 Varicose veins of lower extremities with other complications  05/03/2014  . Major depressive disorder, recurrent episode, unspecified 04/11/2013  . Major depressive disorder, single episode, moderate (HCC) 01/05/2013  . Major depressive disorder, recurrent episode, severe, without mention of psychotic behavior 11/15/2012   Past Medical History:  Diagnosis Date  . Anxiety   . Arthralgia   . Carpal tunnel syndrome   . DDD (degenerative disc disease), cervical   . DDD (degenerative disc disease), lumbar   . Depression   . Full dentures   . GERD (gastroesophageal reflux disease)   . History of hiatal hernia   . Migraine headache   . Myalgia   . Myofascial pain   . Osteoarthritis   . Primary localized osteoarthritis of right hip   . Right rotator cuff tear   . Spondylosis   . Varicose veins   . Wears glasses     Family History  Problem Relation Age of Onset  . Diabetes Mother   . Varicose Veins Mother   . Asthma Mother   . Heart defect Sister     Past Surgical History:  Procedure Laterality Date  . ABDOMINAL HYSTERECTOMY  1988  . APPENDECTOMY    . CARPAL TUNNEL RELEASE Bilateral   . COLONOSCOPY WITH ESOPHAGOGASTRODUODENOSCOPY (EGD)    . dental posts  holds bottom dentures in place  . DILATION AND CURETTAGE OF UTERUS    . FRACTURE SURGERY Left    LLE  . MULTIPLE TOOTH EXTRACTIONS    . SPINAL CORD STIMULATOR INSERTION     located near the right hip and waist area  . TONSILLECTOMY    . TOTAL HIP ARTHROPLASTY Right 03/20/2018   Procedure: RIGHT TOTAL HIP ARTHROPLASTY ANTERIOR APPROACH;  Surgeon: Tarry Kos, MD;  Location: [facility];  Service: Orthopedics;  Laterality: Right;   Social History   Occupational History  . Not on file  Tobacco Use  . Smoking status: Former Smoker    Types: Cigarettes    Last attempt to quit: 05/03/2004    Years since quitting: 14.1  . Smokeless tobacco: Never Used  Substance and Sexual Activity  . Alcohol use: No    Alcohol/week: 0.0 standard drinks  . Drug use: No  . Sexual activity: Never

## 2018-07-06 ENCOUNTER — Telehealth (INDEPENDENT_AMBULATORY_CARE_PROVIDER_SITE_OTHER): Payer: Self-pay | Admitting: Orthopaedic Surgery

## 2018-07-06 NOTE — Telephone Encounter (Signed)
Patient left a voicemail requesting a call back about her medication list and getting some removed. Please advise # [phone]

## 2018-07-07 ENCOUNTER — Encounter (INDEPENDENT_AMBULATORY_CARE_PROVIDER_SITE_OTHER): Payer: Self-pay | Admitting: Orthopaedic Surgery

## 2018-07-07 NOTE — Telephone Encounter (Signed)
Fixed meds for patient

## 2018-07-07 NOTE — Addendum Note (Signed)
Addended by: Albertina Parr on: 07/07/2018 11:18 AM   Modules accepted: Orders

## 2018-09-12 ENCOUNTER — Other Ambulatory Visit (INDEPENDENT_AMBULATORY_CARE_PROVIDER_SITE_OTHER): Payer: Self-pay

## 2018-09-12 ENCOUNTER — Ambulatory Visit (INDEPENDENT_AMBULATORY_CARE_PROVIDER_SITE_OTHER): Payer: Medicare Other | Admitting: Orthopaedic Surgery

## 2018-09-12 ENCOUNTER — Ambulatory Visit (INDEPENDENT_AMBULATORY_CARE_PROVIDER_SITE_OTHER): Payer: Medicare Other

## 2018-09-12 DIAGNOSIS — M545 Low back pain, unspecified: Secondary | ICD-10-CM

## 2018-09-12 DIAGNOSIS — Z96641 Presence of right artificial hip joint: Secondary | ICD-10-CM

## 2018-09-12 NOTE — Progress Notes (Signed)
Office Visit Note   Patient: Anne Terrell           Date of Birth: 02-15-1941           MRN: [id number] Visit Date: 09/12/2018              Requested by: Jettie PaganFalstreau, Brooke A, NP [address] [place], [place] [id number] PCP: Jettie PaganFalstreau, Brooke A, NP   Assessment & Plan: Visit Diagnoses:  1. Low back pain, unspecified back pain laterality, unspecified chronicity, unspecified whether sciatica present     Plan: Impression is chronic low back pain status post spinal cord stimulator.  The [employer] representative was present with us today who reprogrammed her spinal cord stimulator.  In terms of her hip replacement she is doing well.  I reviewed her MRI that she had of her hips earlier this year which showed no left hip disease.  Her symptoms and signs are consistent with her back issues.  We will send a referral to Dr. Ollen BowlHarkins [facility].  I would like to see her back in 6 months for her one-year follow-up for her hip replacement.  She will need standing AP pelvis and lateral hip on return.  Follow-Up Instructions: Return in about 6 months (around 03/13/2019).   Orders:  Orders Placed This Encounter  Procedures  . XR Lumbar Spine 2-3 Views   No orders of the defined types were placed in this encounter.     Procedures: No procedures performed   Clinical Data: No additional findings.   Subjective: Chief Complaint  Patient presents with  . Right Hip - Pain  . Left Hip - Pain    Anne Terrell is 6 months status post right total hip replacement.  She is doing well from that standpoint.  She comes in today for low back pain that migrates between the left and right buttock area that radiates all the way down into her foot.  She has chronic low back pain and has a spinal cord stimulator that was placed by Dr. Jordan LikesSpivey.  She denies any pain that is reminiscent of her hip arthritis pain.  She denies any signs or symptoms of cauda equina.  Denies any injuries.  She has  resumed her chronic oxycodone that she used to take before her hip replacement for her back pain.   Review of Systems  Constitutional: Negative.   HENT: Negative.   Eyes: Negative.   Respiratory: Negative.   Cardiovascular: Negative.   Endocrine: Negative.   Musculoskeletal: Negative.   Neurological: Negative.   Hematological: Negative.   Psychiatric/Behavioral: Negative.   All other systems reviewed and are negative.    Objective: Vital Signs: There were no vitals taken for this visit.  Physical Exam Vitals signs and nursing note reviewed.  Constitutional:      Appearance: She is well-developed.  Pulmonary:     Effort: Pulmonary effort is normal.  Skin:    General: Skin is warm.     Capillary Refill: Capillary refill takes less than 2 seconds.  Neurological:     Mental Status: She is alert and oriented to person, place, and time.  Psychiatric:        Behavior: Behavior normal.        Thought Content: Thought content normal.        Judgment: Judgment normal.     Ortho Exam Bilateral hip exam shows no pain with rotation.  Lateral hips are nontender.  Her lumbar spine is tender to palpation. Specialty  Comments:  No specialty comments available.  Imaging: Xr Lumbar Spine 2-3 Views  Result Date: 09/12/2018 Disc space narrowing of L5-S1.  Grade 1 L4-L5 spondylolisthesis.  Lumbar spondylosis.    PMFS History: Patient Active Problem List   Diagnosis Date Noted  . SIRS (systemic inflammatory response syndrome) (HCC) 03/23/2018  . Acute blood loss anemia 03/23/2018  . Status post right hip replacement 03/20/2018  . Primary osteoarthritis of right hip   . 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 Varicose veins of lower extremities with other complications 05/03/2014  . Major depressive disorder, recurrent episode, unspecified 04/11/2013  . Major depressive disorder, single episode, moderate (HCC) 01/05/2013  . Major depressive disorder, recurrent episode, severe, without  mention of psychotic behavior 11/15/2012   Past Medical History:  Diagnosis Date  . Anxiety   . Arthralgia   . Carpal tunnel syndrome   . DDD (degenerative disc disease), cervical   . DDD (degenerative disc disease), lumbar   . Depression   . Full dentures   . GERD (gastroesophageal reflux disease)   . History of hiatal hernia   . Migraine headache   . Myalgia   . Myofascial pain   . Osteoarthritis   . Primary localized osteoarthritis of right hip   . Right rotator cuff tear   . Spondylosis   . Varicose veins   . Wears glasses     Family History  Problem Relation Age of Onset  . Diabetes Mother   . Varicose Veins Mother   . Asthma Mother   . Heart defect Sister     Past Surgical History:  Procedure Laterality Date  . ABDOMINAL HYSTERECTOMY  1988  . APPENDECTOMY    . CARPAL TUNNEL RELEASE Bilateral   . COLONOSCOPY WITH ESOPHAGOGASTRODUODENOSCOPY (EGD)    . dental posts     holds bottom dentures in place  . DILATION AND CURETTAGE OF UTERUS    . FRACTURE SURGERY Left    LLE  . MULTIPLE TOOTH EXTRACTIONS    . SPINAL CORD STIMULATOR INSERTION     located near the right hip and waist area  . TONSILLECTOMY    . TOTAL HIP ARTHROPLASTY Right 03/20/2018   Procedure: RIGHT TOTAL HIP ARTHROPLASTY ANTERIOR APPROACH;  Surgeon: Tarry Kos, MD;  Location: [facility];  Service: Orthopedics;  Laterality: Right;   Social History   Occupational History  . Not on file  Tobacco Use  . Smoking status: Former Smoker    Types: Cigarettes    Last attempt to quit: 05/03/2004    Years since quitting: 14.3  . Smokeless tobacco: Never Used  Substance and Sexual Activity  . Alcohol use: No    Alcohol/week: 0.0 standard drinks  . Drug use: No  . Sexual activity: Never

## 2018-09-20 ENCOUNTER — Ambulatory Visit (INDEPENDENT_AMBULATORY_CARE_PROVIDER_SITE_OTHER): Payer: Medicare Other | Admitting: Orthopaedic Surgery

## 2018-10-27 ENCOUNTER — Other Ambulatory Visit: Payer: Self-pay | Admitting: Anesthesiology

## 2018-10-27 DIAGNOSIS — M47816 Spondylosis without myelopathy or radiculopathy, lumbar region: Secondary | ICD-10-CM

## 2018-10-27 DIAGNOSIS — M47812 Spondylosis without myelopathy or radiculopathy, cervical region: Secondary | ICD-10-CM

## 2018-10-30 ENCOUNTER — Telehealth: Payer: Self-pay | Admitting: Nurse Practitioner

## 2018-10-30 NOTE — Telephone Encounter (Signed)
Phone call to patient to verify medication list and allergies for myelogram procedure. Pt instructed to hold Trazodone for 48hrs prior to myelogram appointment time. Pt verbalized understanding. 

## 2018-11-06 ENCOUNTER — Ambulatory Visit
Admission: RE | Admit: 2018-11-06 | Discharge: 2018-11-06 | Disposition: A | Discharge status: Home / Self Care | Payer: Medicare Other | Source: Ambulatory Visit | Attending: Anesthesiology | Admitting: Anesthesiology

## 2018-11-06 DIAGNOSIS — M47816 Spondylosis without myelopathy or radiculopathy, lumbar region: Secondary | ICD-10-CM

## 2018-11-06 DIAGNOSIS — M47812 Spondylosis without myelopathy or radiculopathy, cervical region: Secondary | ICD-10-CM

## 2018-11-06 MED ORDER — DIAZEPAM 5 MG PO TABS
5.0000 mg | ORAL_TABLET | Freq: Once | ORAL | Status: AC
  Administered 2018-11-06: 5 mg via ORAL

## 2018-11-06 MED ORDER — IOPAMIDOL (ISOVUE-M 300) INJECTION 61%
10.0000 mL | Freq: Once | INTRAMUSCULAR | Status: AC | PRN
  Administered 2018-11-06: 10 mL via INTRATHECAL

## 2018-11-06 NOTE — Discharge Instructions (Signed)

## 2019-03-13 ENCOUNTER — Other Ambulatory Visit: Payer: Self-pay

## 2019-03-13 ENCOUNTER — Ambulatory Visit (INDEPENDENT_AMBULATORY_CARE_PROVIDER_SITE_OTHER): Payer: Medicare Other | Admitting: Orthopaedic Surgery

## 2019-03-13 ENCOUNTER — Ambulatory Visit (INDEPENDENT_AMBULATORY_CARE_PROVIDER_SITE_OTHER): Payer: Medicare Other

## 2019-03-13 ENCOUNTER — Encounter: Payer: Self-pay | Admitting: Orthopaedic Surgery

## 2019-03-13 DIAGNOSIS — Z96641 Presence of right artificial hip joint: Secondary | ICD-10-CM | POA: Diagnosis not present

## 2019-03-13 NOTE — Progress Notes (Signed)
Post-Op Visit Note   Patient: Anne Terrell           Date of Birth: [birth year]           MRN: [id number] Visit Date: 03/13/2019 PCP: Tomasa Hose, NP   Assessment & Plan:  Chief Complaint:  Chief Complaint  Patient presents with  . Right Hip - Follow-up   Visit Diagnoses:  1. Status post right hip replacement     Plan: Patient is a pleasant 78 year old female who presents to our clinic today 1 year status post right anterior total hip replacement, date of surgery 03/20/2018.  She has been doing fairly well.  She still exhibits some pain to the lateral aspect with activity as well as sleeping on the right side, but notes this has gradually improved over time.  Stable exam of the right hip.  This point, she will continue with activities as tolerated.  Dental prophylaxis reinforced.  Follow-up with [place] in 1 years time for repeat evaluation and x-rays.  Call with concerns or questions the meantime.  Follow-Up Instructions: Return in about 1 year (around 03/12/2020).   Orders:  Orders Placed This Encounter  Procedures  . XR HIP UNILAT W OR W/O PELVIS 2-3 VIEWS RIGHT   No orders of the defined types were placed in this encounter.   Imaging: Xr Hip Unilat W Or W/o Pelvis 2-3 Views Right  Result Date: 03/13/2019 X-rays reveal a well-seated prosthesis without complication   PMFS History: Patient Active Problem List   Diagnosis Date Noted  . SIRS (systemic inflammatory response syndrome) ([place]) 03/23/2018  . Acute blood loss anemia 03/23/2018  . Status post right hip replacement 03/20/2018  . Primary osteoarthritis of right hip   . 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 Varicose veins of lower extremities with other complications 53/61/4431  . Major depressive disorder, recurrent episode, unspecified 04/11/2013  . Major depressive disorder, single episode, moderate ([place]) 01/05/2013  . Major depressive disorder, recurrent episode, severe, without mention of psychotic behavior  11/15/2012   Past Medical History:  Diagnosis Date  . Anxiety   . Arthralgia   . Carpal tunnel syndrome   . DDD (degenerative disc disease), cervical   . DDD (degenerative disc disease), lumbar   . Depression   . Full dentures   . GERD (gastroesophageal reflux disease)   . History of hiatal hernia   . Migraine headache   . Myalgia   . Myofascial pain   . Osteoarthritis   . Primary localized osteoarthritis of right hip   . Right rotator cuff tear   . Spondylosis   . Varicose veins   . Wears glasses     Family History  Problem Relation Age of Onset  . Diabetes Mother   . Varicose Veins Mother   . Asthma Mother   . Heart defect Sister     Past Surgical History:  Procedure Laterality Date  . ABDOMINAL HYSTERECTOMY  1988  . APPENDECTOMY    . CARPAL TUNNEL RELEASE Bilateral   . COLONOSCOPY WITH ESOPHAGOGASTRODUODENOSCOPY (EGD)    . dental posts     holds bottom dentures in place  . DILATION AND CURETTAGE OF UTERUS    . FRACTURE SURGERY Left    LLE  . MULTIPLE TOOTH EXTRACTIONS    . SPINAL CORD STIMULATOR INSERTION     located near the right hip and waist area  . TONSILLECTOMY    . TOTAL HIP ARTHROPLASTY Right 03/20/2018   Procedure: RIGHT TOTAL HIP ARTHROPLASTY ANTERIOR  APPROACH;  Surgeon: Tarry KosXu, Adri Schloss M, MD;  Location: [facility];  Service: Orthopedics;  Laterality: Right;   Social History   Occupational History  . Not on file  Tobacco Use  . Smoking status: Former Smoker    Types: Cigarettes    Quit date: 05/03/2004    Years since quitting: 14.8  . Smokeless tobacco: Never Used  Substance and Sexual Activity  . Alcohol use: No    Alcohol/week: 0.0 standard drinks  . Drug use: No  . Sexual activity: Never

## 2019-04-02 DIAGNOSIS — G629 Polyneuropathy, unspecified: Secondary | ICD-10-CM | POA: Insufficient documentation

## 2019-07-11 DIAGNOSIS — T859XXA Unspecified complication of internal prosthetic device, implant and graft, initial encounter: Secondary | ICD-10-CM | POA: Insufficient documentation

## 2019-07-24 ENCOUNTER — Other Ambulatory Visit (HOSPITAL_COMMUNITY): Payer: Medicare Other

## 2019-07-27 ENCOUNTER — Ambulatory Visit (HOSPITAL_COMMUNITY): Admission: RE | Admit: 2019-07-27 | Payer: Medicare Other | Source: Home / Self Care | Admitting: Anesthesiology

## 2019-07-27 ENCOUNTER — Encounter (HOSPITAL_COMMUNITY): Admission: RE | Payer: Self-pay | Source: Home / Self Care

## 2019-07-27 SURGERY — LUMBAR SPINAL CORD STIMULATOR REMOVAL
Anesthesia: General

## 2019-08-14 DIAGNOSIS — R072 Precordial pain: Secondary | ICD-10-CM | POA: Insufficient documentation

## 2019-08-14 NOTE — Progress Notes (Signed)
Patient referred by Tomasa Hose, NP for chest pain  Subjective:   Anne Terrell, female    DOB: 08-17-1941, 78 y.o.   MRN: [id number]   Chief Complaint  Patient presents with  . Chest Pain  . New Patient (Initial Visit)     HPI  78 y.o. Caucasian female with spinal stenosis, referred for evaluation of chest pain.  The last few weeks, patient has had chest heaviness and shortness of breath symptoms.  They seem to occur with minimal activity, such as working in the yard, prompting her to stop.  That said, she also has the symptoms at rest, sometimes worse with eating.  She denies any dyspepsia or any other GI symptoms.  Her physical activities significantly limited due to spinal stenosis at multiple levels.  Past Medical History:  Diagnosis Date  . Anxiety   . Arthralgia   . Carpal tunnel syndrome   . DDD (degenerative disc disease), cervical   . DDD (degenerative disc disease), lumbar   . Depression   . Full dentures   . GERD (gastroesophageal reflux disease)   . History of hiatal hernia   . Migraine headache   . Myalgia   . Myofascial pain   . Osteoarthritis   . Primary localized osteoarthritis of right hip   . Right rotator cuff tear   . Spondylosis   . Varicose veins   . Wears glasses      Past Surgical History:  Procedure Laterality Date  . ABDOMINAL HYSTERECTOMY  1988  . APPENDECTOMY    . CARPAL TUNNEL RELEASE Bilateral   . COLONOSCOPY WITH ESOPHAGOGASTRODUODENOSCOPY (EGD)    . dental posts     holds bottom dentures in place  . DILATION AND CURETTAGE OF UTERUS    . FRACTURE SURGERY Left    LLE  . MULTIPLE TOOTH EXTRACTIONS    . SPINAL CORD STIMULATOR INSERTION     located near the right hip and waist area  . TONSILLECTOMY    . TOTAL HIP ARTHROPLASTY Right 03/20/2018   Procedure: RIGHT TOTAL HIP ARTHROPLASTY ANTERIOR APPROACH;  Surgeon: Leandrew Koyanagi, MD;  Location: [facility];  Service: Orthopedics;  Laterality: Right;     Social History    Tobacco Use  Smoking Status Former Smoker  . Types: Cigarettes  . Quit date: 05/03/2004  . Years since quitting: 15.2  Smokeless Tobacco Never Used    Social History   Substance and Sexual Activity  Alcohol Use No  . Alcohol/week: 0.0 standard drinks     Family History  Problem Relation Age of Onset  . Diabetes Mother   . Varicose Veins Mother   . Asthma Mother   . Heart defect Sister      Current Outpatient Medications on File Prior to Visit  Medication Sig Dispense Refill  . gabapentin (NEURONTIN) 400 MG capsule Take 400 mg by mouth 4 (four) times daily.    . Oxycodone HCl 10 MG TABS Take 10 mg by mouth every 6 (six) hours.    . traZODone (DESYREL) 50 MG tablet TAKE 1 TABLET AT BEDTIME 90 tablet 2   No current facility-administered medications on file prior to visit.     Cardiovascular and other pertinent studies:   EKG 08/15/2019: Sinus rhythm 71 bpm. Normal EKG.   Recent labs: 04/04/2019: Glucose 92, BUN/Cr 16/0.84. EGFR 68. Na/K 143/4.7. Rest of the CMP normal TSH normal    Review of Systems  Constitution: Negative for decreased appetite, malaise/fatigue, weight gain and  weight loss.  HENT: Negative for congestion.   Eyes: Negative for visual disturbance.  Cardiovascular: Positive for chest pain and dyspnea on exertion. Negative for leg swelling, palpitations and syncope.  Respiratory: Positive for shortness of breath. Negative for cough.   Endocrine: Negative for cold intolerance.  Hematologic/Lymphatic: Does not bruise/bleed easily.  Skin: Negative for itching and rash.  Musculoskeletal: Positive for back pain. Negative for myalgias.  Gastrointestinal: Negative for abdominal pain, nausea and vomiting.  Genitourinary: Negative for dysuria.  Neurological: Negative for dizziness and weakness.  Psychiatric/Behavioral: The patient is not nervous/anxious.   All other systems reviewed and are negative.         Vitals:   08/15/19 0958  BP: 133/67   Pulse: 84  Temp: 98.6 F (37 C)  SpO2: 94%     Body mass index is 30.31 kg/m. Filed Weights   08/15/19 0958  Weight: 171 lb 1.6 oz (77.6 kg)     Objective:   Physical Exam  Constitutional: She is oriented to person, place, and time. She appears well-developed and well-nourished. No distress.  HENT:  Head: Normocephalic and atraumatic.  Eyes: Pupils are equal, round, and reactive to light. Conjunctivae are normal.  Neck: No JVD present.  Cardiovascular: Normal rate, regular rhythm and intact distal pulses.  No murmur heard. Pulmonary/Chest: Effort normal and breath sounds normal. She has no wheezes. She has no rales.  Abdominal: Soft. Bowel sounds are normal. There is no rebound.  Musculoskeletal:        General: No edema.  Lymphadenopathy:    She has no cervical adenopathy.  Neurological: She is alert and oriented to person, place, and time. No cranial nerve deficit.  Skin: Skin is warm and dry.  Psychiatric: She has a normal mood and affect.  Nursing note and vitals reviewed.        Assessment & Recommendations:   78 y.o. Caucasian female with spinal stenosis, referred for evaluation of chest pain.  Chest heaviness, exertional dyspnea: She has complaints of both typical evidence of typical symptoms.  She is unable to walk on treadmill due to spinal stenosis at several levels.  I will obtain echocardiogram and oncological nuclear stress test.  I will also obtain lipid panel for stratification.  I will follow-up after above tests.       Thank you for referring the patient to [place]. Please feel free to contact with any questions.  Nigel Mormon, MD [facility] Pager: [phone] Office: [phone]

## 2019-08-15 ENCOUNTER — Encounter: Payer: Self-pay | Admitting: Cardiology

## 2019-08-15 ENCOUNTER — Ambulatory Visit (INDEPENDENT_AMBULATORY_CARE_PROVIDER_SITE_OTHER): Payer: Medicare Other | Admitting: Cardiology

## 2019-08-15 ENCOUNTER — Other Ambulatory Visit: Payer: Self-pay

## 2019-08-15 VITALS — BP 133/67 | HR 84 | Temp 98.6°F | Ht 63.0 in | Wt 171.1 lb

## 2019-08-15 DIAGNOSIS — Z13220 Encounter for screening for lipoid disorders: Secondary | ICD-10-CM | POA: Diagnosis not present

## 2019-08-15 DIAGNOSIS — R0609 Other forms of dyspnea: Secondary | ICD-10-CM | POA: Diagnosis not present

## 2019-08-15 DIAGNOSIS — R072 Precordial pain: Secondary | ICD-10-CM | POA: Diagnosis not present

## 2019-08-16 ENCOUNTER — Ambulatory Visit (INDEPENDENT_AMBULATORY_CARE_PROVIDER_SITE_OTHER): Payer: Medicare Other

## 2019-08-16 DIAGNOSIS — R0609 Other forms of dyspnea: Secondary | ICD-10-CM

## 2019-08-16 DIAGNOSIS — R072 Precordial pain: Secondary | ICD-10-CM

## 2019-08-22 ENCOUNTER — Other Ambulatory Visit: Payer: Self-pay

## 2019-08-22 ENCOUNTER — Ambulatory Visit (INDEPENDENT_AMBULATORY_CARE_PROVIDER_SITE_OTHER): Payer: Medicare Other

## 2019-08-22 DIAGNOSIS — R072 Precordial pain: Secondary | ICD-10-CM | POA: Diagnosis not present

## 2019-08-22 DIAGNOSIS — R0609 Other forms of dyspnea: Secondary | ICD-10-CM | POA: Diagnosis not present

## 2019-08-29 ENCOUNTER — Other Ambulatory Visit: Payer: Self-pay

## 2019-08-29 ENCOUNTER — Telehealth (INDEPENDENT_AMBULATORY_CARE_PROVIDER_SITE_OTHER): Payer: Medicare Other | Admitting: Cardiology

## 2019-08-29 DIAGNOSIS — R0789 Other chest pain: Secondary | ICD-10-CM

## 2019-08-29 DIAGNOSIS — R0609 Other forms of dyspnea: Secondary | ICD-10-CM | POA: Diagnosis not present

## 2019-08-29 NOTE — Progress Notes (Signed)
Patient referred by Chesley Noon, MD for chest pain  Subjective:   Anne Terrell, female    DOB: [birth year], 78 y.o.   MRN: [id number]  I connected with the patient on 08/29/2019 by a telephone call and verified that I am speaking with the correct person using two identifiers.     I offered the patient a video enabled application for a virtual visit. Unfortunately, this could not be accomplished due to technical difficulties/lack of video enabled phone/computer. I discussed the limitations of evaluation and management by telemedicine and the availability of in person appointments. The patient expressed understanding and agreed to proceed.   This visit type was conducted due to national recommendations for restrictions regarding the COVID-19 Pandemic (e.g. social distancing).  This format is felt to be most appropriate for this patient at this time.  All issues noted in this document were discussed and addressed.  No physical exam was performed (except for noted visual exam findings with Tele health visits).  The patient has consented to conduct a Tele health visit and understands insurance will be billed.    Chief Complaint  Patient presents with  . Chest Pain     HPI  78 y.o. Caucasian female with spinal stenosis, referred for evaluation of chest pain.  Patient underwent echocardiogram and stress test, results discussed with the patient. Patient has had no recurrence of chest pain symptoms since last visit.   Past Medical History:  Diagnosis Date  . Anxiety   . Arthralgia   . Carpal tunnel syndrome   . DDD (degenerative disc disease), cervical   . DDD (degenerative disc disease), lumbar   . Depression   . Full dentures   . GERD (gastroesophageal reflux disease)   . History of hiatal hernia   . Migraine headache   . Myalgia   . Myofascial pain   . Osteoarthritis   . Primary localized osteoarthritis of right hip   . Right rotator cuff tear   . Spondylosis   .  Varicose veins   . Wears glasses      Past Surgical History:  Procedure Laterality Date  . ABDOMINAL HYSTERECTOMY  1988  . APPENDECTOMY    . CARPAL TUNNEL RELEASE Bilateral   . COLONOSCOPY WITH ESOPHAGOGASTRODUODENOSCOPY (EGD)    . dental posts     holds bottom dentures in place  . DILATION AND CURETTAGE OF UTERUS    . FRACTURE SURGERY Left    LLE  . MULTIPLE TOOTH EXTRACTIONS    . SPINAL CORD STIMULATOR INSERTION     located near the right hip and waist area  . TONSILLECTOMY    . TOTAL HIP ARTHROPLASTY Right 03/20/2018   Procedure: RIGHT TOTAL HIP ARTHROPLASTY ANTERIOR APPROACH;  Surgeon: Leandrew Koyanagi, MD;  Location: [facility];  Service: Orthopedics;  Laterality: Right;     Social History   Tobacco Use  Smoking Status Former Smoker  . Packs/day: 1.00  . Years: 30.00  . Pack years: 30.00  . Types: Cigarettes  . Quit date: 05/03/2004  . Years since quitting: 15.3  Smokeless Tobacco Never Used    Social History   Substance and Sexual Activity  Alcohol Use No  . Alcohol/week: 0.0 standard drinks     Family History  Problem Relation Age of Onset  . Diabetes Mother   . Varicose Veins Mother   . Asthma Mother   . Heart defect Sister      Current Outpatient Medications on File Prior to Visit  Medication Sig Dispense Refill  . gabapentin (NEURONTIN) 400 MG capsule Take 400 mg by mouth 4 (four) times daily.    . Oxycodone HCl 10 MG TABS Take 10 mg by mouth every 6 (six) hours.     No current facility-administered medications on file prior to visit.    Cardiovascular and other pertinent studies:  Lexiscan Tetrofosmin Stress Test  08/22/2019: Nondiagnostic ECG stress. Normal myocardial perfusion. All segments of left ventricle demonstrated normal wall motion and thickening. Stress LV EF is normal 53%.  No previous exam available for comparison. Low risk study.   Echocardiogram 08/16/2019:  Normal LV systolic function with EF 58%. Left ventricle cavity is normal  in size. Normal global wall motion. Doppler evidence of grade I (impaired) diastolic dysfunction, normal LAP. Calculated EF 58%. Trileaflet aortic valve.  Mild (Grade I) aortic regurgitation. Structurally normal mitral valve.  Mild (Grade I) mitral regurgitation. Structurally normal tricuspid valve.  Mild tricuspid regurgitation. No evidence of pulmonary hypertension.   EKG 08/15/2019: Sinus rhythm 71 bpm. Normal EKG.   Recent labs: 04/04/2019: Glucose 92, BUN/Cr 16/0.84. EGFR 68. Na/K 143/4.7. Rest of the CMP normal TSH normal    Review of Systems  Constitution: Negative for decreased appetite, malaise/fatigue, weight gain and weight loss.  HENT: Negative for congestion.   Eyes: Negative for visual disturbance.  Cardiovascular: Positive for chest pain and dyspnea on exertion. Negative for leg swelling, palpitations and syncope.  Respiratory: Positive for shortness of breath. Negative for cough.   Endocrine: Negative for cold intolerance.  Hematologic/Lymphatic: Does not bruise/bleed easily.  Skin: Negative for itching and rash.  Musculoskeletal: Positive for back pain. Negative for myalgias.  Gastrointestinal: Negative for abdominal pain, nausea and vomiting.  Genitourinary: Negative for dysuria.  Neurological: Negative for dizziness and weakness.  Psychiatric/Behavioral: The patient is not nervous/anxious.   All other systems reviewed and are negative.       No vitals available.   Objective:   Physical Exam  Not performed. Telephone visit.        Assessment & Recommendations:   78 y.o. Caucasian female with spinal stenosis, referred for evaluation of chest pain.  Chest heaviness, exertional dyspnea: Symptoms now resolved. No ischemia on stress testing. Mild AI, mild MR, mild TR on echocardiogram.. Continue risk factor modification with diet and lifestyle changes.  I will see her on as needed basis.   Nigel Mormon, MD [facility]. [place] Pager:  [phone] Office: [phone]

## 2019-09-03 ENCOUNTER — Other Ambulatory Visit: Payer: Self-pay | Admitting: Anesthesiology

## 2019-09-18 ENCOUNTER — Other Ambulatory Visit (HOSPITAL_COMMUNITY)
Admission: RE | Admit: 2019-09-18 | Discharge: 2019-09-18 | Disposition: A | Discharge status: Home / Self Care | Payer: Medicare Other | Source: Ambulatory Visit | Attending: Anesthesiology | Admitting: Anesthesiology

## 2019-09-18 DIAGNOSIS — Z01812 Encounter for preprocedural laboratory examination: Secondary | ICD-10-CM | POA: Insufficient documentation

## 2019-09-18 DIAGNOSIS — Z20822 Contact with and (suspected) exposure to covid-19: Secondary | ICD-10-CM | POA: Insufficient documentation

## 2019-09-19 LAB — NOVEL CORONAVIRUS, NAA (HOSP ORDER, SEND-OUT TO REF LAB; TAT 18-24 HRS): SARS-CoV-2, NAA: NOT DETECTED

## 2019-09-20 ENCOUNTER — Encounter (HOSPITAL_COMMUNITY): Payer: Self-pay | Admitting: Anesthesiology

## 2019-09-20 ENCOUNTER — Other Ambulatory Visit: Payer: Self-pay

## 2019-09-20 NOTE — Progress Notes (Addendum)
Anne Terrell denies chest pain or shortness of breath.  Anne Terrell did have chest pain and shortness of breath in December 2020, patient reports that it lasted 1 week. Anne Terrell saw her PCP, Dr Cyndia Bent at [facility], [place]. Dr Cyndia Bent referred Anne Terrell to [facility]. Patient was seen by Dr. Rosemary Holms. An Echo and Stress was done. Dr Rosemary Holms felt that chest pain was muscular and no need for patient to be seen by him, only if needed. Anne. Terrell tested negative for Covid 09/18/2019 and has been in quarantine since that time.  I instructed patient to not take Electro Keto  between now and surgery.

## 2019-09-20 NOTE — Anesthesia Preprocedure Evaluation (Addendum)
Anesthesia Evaluation  Patient identified by MRN, date of birth, ID band Patient awake    Reviewed: Allergy & Precautions, NPO status , Patient's Chart, lab work & pertinent test results  Airway Mallampati: II  TM Distance: >3 FB Neck ROM: Full    Dental  (+) Edentulous Lower, Edentulous Upper   Pulmonary neg pulmonary ROS, former smoker,    Pulmonary exam normal breath sounds clear to auscultation       Cardiovascular negative cardio ROS Normal cardiovascular exam Rhythm:Regular Rate:Normal     Neuro/Psych  Headaches, Anxiety Depression negative psych ROS   GI/Hepatic Neg liver ROS, GERD  ,  Endo/Other  negative endocrine ROS  Renal/GU negative Renal ROS  negative genitourinary   Musculoskeletal  (+) Arthritis , Osteoarthritis,    Abdominal (+) + obese,   Peds negative pediatric ROS (+)  Hematology negative hematology ROS (+)   Anesthesia Other Findings   Reproductive/Obstetrics negative OB ROS                            Anesthesia Physical Anesthesia Plan  ASA: II  Anesthesia Plan: General   Post-op Pain Management:    Induction: Intravenous  PONV Risk Score and Plan: 3 and Ondansetron, Dexamethasone, Midazolam and Treatment may vary due to age or medical condition  Airway Management Planned: Oral ETT  Additional Equipment:   Intra-op Plan:   Post-operative Plan: Extubation in OR  Informed Consent: I have reviewed the patients History and Physical, chart, labs and discussed the procedure including the risks, benefits and alternatives for the proposed anesthesia with the patient or authorized representative who has indicated his/her understanding and acceptance.     Dental advisory given  Plan Discussed with: CRNA  Anesthesia Plan Comments: (PAT note written 09/20/2019 by Shonna Chock, PA-C. )       Anesthesia Quick Evaluation

## 2019-09-20 NOTE — H&P (Addendum)
Anne Terrell is an 79 y.o. female.   Chief Complaint: back pain; no benefit from spinal cord stimulator placed elsewhere HPI: Anne Terrell has a 20 year history of cervical stenosis with thoracic and lumbar degenerative disc disease.  The patient utilizes oxycodone 10/325 every 6 hours.  This was recently increased by Rulon Abide, NP.  The patient had reported that the medication was beneficial but was lasting only about 6 hours before she found she needed another dose.  She was previously being managed by Dr. Jordan Likes but had some type of disagreement with that practice over a urine drug screen and subsequently came to our practice.  She also has a Medtronic spinal cord stimulator in place but does not find that largely beneficial.  She also has a history of a right total hip replacement March 20, 2018 that she describes as having inflamed her entire right side, but when it failed to resolve adequately, her orthopedic surgeon told her that her problems were related to her back, not her hip. She has had bilateral carpal tunnel releases as well.  Anne Terrell was recently seen in clinic,  accompanied by her brother-in-law, who is apparently a good resource, and source of support for her.  She lives alone is trying to be quite active but unfortunately her pain is extremely limiting at this point.  He had several questions which we reviewed with him.  They largely have to do with options for treatment for her.  We engaged in a discussion over 45 minutes today about possible options, variations in those treatment plans, things that the patient has tried and not found successful, including her spinal cord stimulator.  She requsted that we remove her Medtronic spinal cord stimulator, rather than attempt an upgrade in technology, or trialing alternatives.  In the interval, she has had a cardiology evaluation, and apparently has no stress inducible ischemia; she thus now presents for removal of her SCS as  requested  Past Medical History:  Diagnosis Date  . Anxiety   . Arthralgia   . Carpal tunnel syndrome   . DDD (degenerative disc disease), cervical   . DDD (degenerative disc disease), lumbar   . Depression   . Full dentures   . GERD (gastroesophageal reflux disease)   . History of hiatal hernia   . Migraine headache   . Myalgia   . Myofascial pain   . Osteoarthritis   . Primary localized osteoarthritis of right hip   . Right rotator cuff tear   . Spondylosis   . Varicose veins   . Wears glasses     Past Surgical History:  Procedure Laterality Date  . ABDOMINAL HYSTERECTOMY  1988  . APPENDECTOMY    . CARPAL TUNNEL RELEASE Bilateral   . COLONOSCOPY WITH ESOPHAGOGASTRODUODENOSCOPY (EGD)    . dental posts     holds bottom dentures in place  . DILATION AND CURETTAGE OF UTERUS    . FRACTURE SURGERY Left    LLE  . MULTIPLE TOOTH EXTRACTIONS    . SPINAL CORD STIMULATOR INSERTION     located near the right hip and waist area  . TONSILLECTOMY    . TOTAL HIP ARTHROPLASTY Right 03/20/2018   Procedure: RIGHT TOTAL HIP ARTHROPLASTY ANTERIOR APPROACH;  Surgeon: Tarry Kos, MD;  Location: [facility];  Service: Orthopedics;  Laterality: Right;    Family History  Problem Relation Age of Onset  . Diabetes Mother   . Varicose Veins Mother   . Asthma Mother   .  Heart defect Sister    Social History:  reports that she quit smoking about 15 years ago. Her smoking use included cigarettes. She has a 30.00 pack-year smoking history. She has never used smokeless tobacco. She reports that she does not drink alcohol or use drugs.  Allergies:  Allergies  Allergen Reactions  . Penicillins Rash    Did it involve swelling of the face/tongue/throat, SOB, or low BP? No Did it involve sudden or severe rash/hives, skin peeling, or any reaction on the inside of your mouth or nose? No Did you need to seek medical attention at a hospital or doctor's office? No When did it last happen?30 +  years If all above answers are "NO", may proceed with cephalosporin use.   . Shellfish Allergy Nausea And Vomiting and Other (See Comments)    Esophageal spasms and vomiting per pt  . Aspirin Other (See Comments)    "Stomach pain"    Medications Prior to Admission  Medication Sig Dispense Refill  . gabapentin (NEURONTIN) 400 MG capsule Take 400 mg by mouth 4 (four) times daily.    Marland Kitchen OVER THE COUNTER MEDICATION Take 1 capsule by mouth daily. Electro Keto    . Oxycodone HCl 10 MG TABS Take 10 mg by mouth 4 (four) times daily.       Results for orders placed or performed during the hospital encounter of 09/18/19 (from the past 48 hour(s))  Novel Coronavirus, NAA (Hosp order, Send-out to Ref Lab; TAT 18-24 hrs     Status: None   Collection Time: 09/18/19 12:20 PM   Specimen: Nasopharyngeal Swab; Respiratory  Result Value Ref Range   SARS-CoV-2, NAA NOT DETECTED NOT DETECTED    Comment: (NOTE) This nucleic acid amplification test was developed and its performance characteristics determined by [employer]. Nucleic acid amplification tests include PCR and TMA. This test has not been FDA cleared or approved. This test has been authorized by FDA under an Emergency Use Authorization (EUA). This test is only authorized for the duration of time the declaration that circumstances exist justifying the authorization of the emergency use of in vitro diagnostic tests for detection of SARS-CoV-2 virus and/or diagnosis of COVID-19 infection under section 564(b)(1) of the Act, 21 U.S.C. 671IWP-8(K) (1), unless the authorization is terminated or revoked sooner. When diagnostic testing is negative, the possibility of a false negative result should be considered in the context of a patient's recent exposures and the presence of clinical signs and symptoms consistent with COVID-19. An individual without symptoms of COVID- 19 and who is not shedding SARS-CoV-2 vi rus would expect to have  a negative (not detected) result in this assay. Performed At: [facility] [address] [place], [place] [id number] Jolene Schimke MD NL:[id number]    Coronavirus Source NASOPHARYNGEAL     Comment: Performed at [facility], [address] [place], [place] [address]   No results found.  Review of Systems  Constitutional: Negative.   HENT: Negative.   Eyes: Negative.   Respiratory: Negative.   Cardiovascular: Negative.   Gastrointestinal: Negative.   Endocrine: Negative.   Genitourinary: Negative.   Musculoskeletal: Positive for back pain. Negative for arthralgias and joint swelling.  Skin: Negative.   Allergic/Immunologic: Negative.   Neurological: Negative.   Hematological: Negative.   Psychiatric/Behavioral: Negative.     Blood pressure (!) 154/57, pulse 89, temperature 98.3 F (36.8 C), temperature source Oral, height 5\' 3"  (1.6 m), weight 77.1 kg, SpO2 96 %. Physical Exam  Constitutional:  She is oriented to person, place, and time. She appears well-developed and well-nourished.  HENT:  Head: Normocephalic and atraumatic.  Eyes: Pupils are equal, round, and reactive to light. EOM are normal.  Cardiovascular: Normal rate.  Respiratory: Effort normal.  Musculoskeletal:        General: Normal range of motion.     Cervical back: Normal range of motion.  Neurological: She is alert and oriented to person, place, and time.  Skin: Skin is warm and dry.  Psychiatric: She has a normal mood and affect. Thought content normal.     Assessment/Plan Chronic pain syndrome, ineffective SCS PLAN: removal of Medtronic SCS  Bonna Gains, MD 09/21/2019, 9:49 AM

## 2019-09-20 NOTE — Progress Notes (Signed)
Anesthesia Chart Review: Kathleene Hazel    Case: [id number] Date/Time: 09/21/19 1029   Procedure: LUMBAR SPINAL CORD STIMULATOR REMOVAL (N/A ) - LUMBAR SPINAL CORD STIMULATOR REMOVAL   Anesthesia type: General   Pre-op diagnosis: Failure of spinal cord stimulator   Location: [facility] OR ROOM 20 / [facility]   Surgeons: Clydell Hakim, MD      DISCUSSION: Patient is a 79 year old female scheduled for the above procedure.  History includes former smoker (quit 2005), GERD, hiatal hernia, migraines, anxiety, myofascial pain, varicose veins, spinal cord stimulator insertion (08/20/13, lead revision 02/18/14), right THA 03/20/18.   Recent cardiology evaluation and testing for chest pain, which is now resolved. Had non-ischemic stress test with normal EF and mild valvular regurgitation on echo. PRN cardiology follow-up recommended at 08/29/19 visit with Dr. Virgina Jock.  09/18/19 COVID-19 test negative. She is a same day work-up, so she will get labs and anesthesia team evaluation on the day of surgery.   VS: Ht 5\' 3"  (1.6 m)   Wt 77.1 kg   BMI 30.11 kg/m   PROVIDERS: Chesley Noon, MD is PCP [facility] Everywhere) Vernell Leep, MD is cardiologist   LABS: She is for labs on arrival day of surgery.   IMAGES: CT C/T/L-spines 11/06/18: IMPRESSION: 1. Cervical disc degeneration greatest at C5-6 where there is moderate right-sided spinal stenosis and moderate to severe right neural foraminal stenosis. 2. Small T11 disc protrusion without stenosis. 3. No spinal canal stenosis in the thoracic or lumbar spine. 4. Severe lower lumbar facet arthrosis. 5. Chronic severe disc degeneration at L5-S1 with unchanged mild neural foraminal stenosis. 6. Grade 1 anterolisthesis of L4 on L5 with mild right neural foraminal stenosis, unchanged. 7. Mild bilateral neural foraminal stenosis at L3-4, slightly progressed from 2017. Aortic Atherosclerosis (ICD10-I70.0).   EKG: EKG 08/15/2019: Sinus rhythm  71 bpm. Normal EKG.   CV: Lexiscan Tetrofosmin Stress Test  08/22/2019: Nondiagnostic ECG stress. Normal myocardial perfusion. All segments of left ventricle demonstrated normal wall motion and thickening. Stress LV EF is normal 53%.  No previous exam available for comparison. Low risk study.   Echocardiogram 08/16/2019:  Normal LV systolic function with EF 58%. Left ventricle cavity is normal in size. Normal global wall motion. Doppler evidence of grade I (impaired) diastolic dysfunction, normal LAP. Calculated EF 58%. Trileaflet aortic valve.  Mild (Grade I) aortic regurgitation. Structurally normal mitral valve.  Mild (Grade I) mitral regurgitation. Structurally normal tricuspid valve.  Mild tricuspid regurgitation. No evidence of pulmonary hypertension.   Past Medical History:  Diagnosis Date  . Anxiety    09/20/2019- not current  . Arthralgia   . Carpal tunnel syndrome   . DDD (degenerative disc disease), cervical   . DDD (degenerative disc disease), lumbar   . Depression    09/21/19- situational - not current 09/21/2019  . Full dentures   . GERD (gastroesophageal reflux disease)    09/19/2018- not current  . History of hiatal hernia   . Migraine headache   . Myalgia   . Myofascial pain   . Osteoarthritis   . Primary localized osteoarthritis of right hip   . Right rotator cuff tear   . Spondylosis   . Varicose veins   . Wears glasses     Past Surgical History:  Procedure Laterality Date  . ABDOMINAL HYSTERECTOMY  1988  . APPENDECTOMY    . CARPAL TUNNEL RELEASE Bilateral   . COLONOSCOPY WITH ESOPHAGOGASTRODUODENOSCOPY (EGD)    . dental posts  holds bottom dentures in place  . DILATION AND CURETTAGE OF UTERUS    . FRACTURE SURGERY Left    LLE  . MULTIPLE TOOTH EXTRACTIONS    . SPINAL CORD STIMULATOR INSERTION     located near the right hip and waist area  . TONSILLECTOMY    . TOTAL HIP ARTHROPLASTY Right 03/20/2018   Procedure: RIGHT TOTAL HIP ARTHROPLASTY  ANTERIOR APPROACH;  Surgeon: Tarry Kos, MD;  Location: [facility];  Service: Orthopedics;  Laterality: Right;    MEDICATIONS: No current facility-administered medications for this encounter.   . gabapentin (NEURONTIN) 400 MG capsule  . OVER THE COUNTER MEDICATION  . Oxycodone HCl 10 MG TABS    Shonna Chock, PA-C Surgical Short Stay/Anesthesiology [facility] Phone [phone] [facility] Phone [phone] 09/20/2019 3:27 PM

## 2019-09-21 ENCOUNTER — Ambulatory Visit (HOSPITAL_COMMUNITY): Payer: Medicare Other | Admitting: Vascular Surgery

## 2019-09-21 ENCOUNTER — Other Ambulatory Visit: Payer: Self-pay

## 2019-09-21 ENCOUNTER — Encounter (HOSPITAL_COMMUNITY)
Admission: RE | Disposition: A | Discharge status: Home / Self Care | Payer: Self-pay | Source: Home / Self Care | Attending: Anesthesiology

## 2019-09-21 ENCOUNTER — Ambulatory Visit (HOSPITAL_COMMUNITY)
Admission: RE | Admit: 2019-09-21 | Discharge: 2019-09-21 | Disposition: A | Discharge status: Home / Self Care | Payer: Medicare Other | Attending: Anesthesiology | Admitting: Anesthesiology

## 2019-09-21 ENCOUNTER — Ambulatory Visit (HOSPITAL_COMMUNITY): Payer: Medicare Other

## 2019-09-21 ENCOUNTER — Encounter (HOSPITAL_COMMUNITY): Payer: Self-pay | Admitting: Anesthesiology

## 2019-09-21 DIAGNOSIS — T85192A Other mechanical complication of implanted electronic neurostimulator (electrode) of spinal cord, initial encounter: Secondary | ICD-10-CM | POA: Diagnosis present

## 2019-09-21 DIAGNOSIS — Z4549 Encounter for adjustment and management of other implanted nervous system device: Secondary | ICD-10-CM | POA: Diagnosis not present

## 2019-09-21 DIAGNOSIS — M4316 Spondylolisthesis, lumbar region: Secondary | ICD-10-CM | POA: Insufficient documentation

## 2019-09-21 DIAGNOSIS — M48061 Spinal stenosis, lumbar region without neurogenic claudication: Secondary | ICD-10-CM | POA: Insufficient documentation

## 2019-09-21 DIAGNOSIS — M1611 Unilateral primary osteoarthritis, right hip: Secondary | ICD-10-CM | POA: Diagnosis not present

## 2019-09-21 DIAGNOSIS — Z880 Allergy status to penicillin: Secondary | ICD-10-CM | POA: Insufficient documentation

## 2019-09-21 DIAGNOSIS — Z96641 Presence of right artificial hip joint: Secondary | ICD-10-CM | POA: Insufficient documentation

## 2019-09-21 DIAGNOSIS — M4802 Spinal stenosis, cervical region: Secondary | ICD-10-CM | POA: Diagnosis not present

## 2019-09-21 DIAGNOSIS — G894 Chronic pain syndrome: Secondary | ICD-10-CM | POA: Insufficient documentation

## 2019-09-21 DIAGNOSIS — M5135 Other intervertebral disc degeneration, thoracolumbar region: Secondary | ICD-10-CM | POA: Diagnosis not present

## 2019-09-21 DIAGNOSIS — Z87891 Personal history of nicotine dependence: Secondary | ICD-10-CM | POA: Insufficient documentation

## 2019-09-21 DIAGNOSIS — M5137 Other intervertebral disc degeneration, lumbosacral region: Secondary | ICD-10-CM | POA: Diagnosis not present

## 2019-09-21 DIAGNOSIS — I700 Atherosclerosis of aorta: Secondary | ICD-10-CM | POA: Diagnosis not present

## 2019-09-21 DIAGNOSIS — Z886 Allergy status to analgesic agent status: Secondary | ICD-10-CM | POA: Insufficient documentation

## 2019-09-21 DIAGNOSIS — Z9682 Presence of neurostimulator: Secondary | ICD-10-CM | POA: Insufficient documentation

## 2019-09-21 HISTORY — PX: SPINAL CORD STIMULATOR REMOVAL: SHX5379

## 2019-09-21 LAB — HEMOGLOBIN AND HEMATOCRIT, BLOOD
HCT: 41.2 % (ref 36.0–46.0)
Hemoglobin: 13.4 g/dL (ref 12.0–15.0)

## 2019-09-21 SURGERY — LUMBAR SPINAL CORD STIMULATOR REMOVAL
Anesthesia: General

## 2019-09-21 MED ORDER — CHLORHEXIDINE GLUCONATE CLOTH 2 % EX PADS: 6.0000 | MEDICATED_PAD | Freq: Once | CUTANEOUS | Status: DC

## 2019-09-21 MED ORDER — MIDAZOLAM HCL 5 MG/5ML IJ SOLN
INTRAMUSCULAR | Status: DC | PRN
  Administered 2019-09-21: 1 mg via INTRAVENOUS

## 2019-09-21 MED ORDER — LACTATED RINGERS IV SOLN: INTRAVENOUS | Status: DC

## 2019-09-21 MED ORDER — SODIUM CHLORIDE 0.9 % IV SOLN
INTRAVENOUS | Status: DC | PRN
  Administered 2019-09-21: 15 ug/min via INTRAVENOUS

## 2019-09-21 MED ORDER — OXYCODONE HCL 10 MG PO TABS: 10.0000 mg | ORAL_TABLET | ORAL | 0 refills | Status: AC | PRN

## 2019-09-21 MED ORDER — LIDOCAINE-EPINEPHRINE 1 %-1:100000 IJ SOLN
INTRAMUSCULAR | Status: AC
  Filled 2019-09-21: qty 1

## 2019-09-21 MED ORDER — ROCURONIUM BROMIDE 50 MG/5ML IV SOSY
PREFILLED_SYRINGE | INTRAVENOUS | Status: DC | PRN
  Administered 2019-09-21: 80 mg via INTRAVENOUS

## 2019-09-21 MED ORDER — HYDROMORPHONE HCL 1 MG/ML IJ SOLN
INTRAMUSCULAR | Status: AC
  Filled 2019-09-21: qty 1

## 2019-09-21 MED ORDER — SUGAMMADEX SODIUM 200 MG/2ML IV SOLN
INTRAVENOUS | Status: DC | PRN
  Administered 2019-09-21: 200 mg via INTRAVENOUS

## 2019-09-21 MED ORDER — MIDAZOLAM HCL 2 MG/2ML IJ SOLN
INTRAMUSCULAR | Status: AC
  Filled 2019-09-21: qty 2

## 2019-09-21 MED ORDER — PROPOFOL 10 MG/ML IV BOLUS
INTRAVENOUS | Status: AC
  Filled 2019-09-21: qty 20

## 2019-09-21 MED ORDER — OXYCODONE HCL 5 MG PO TABS
5.0000 mg | ORAL_TABLET | Freq: Once | ORAL | Status: AC | PRN
  Administered 2019-09-21: 5 mg via ORAL

## 2019-09-21 MED ORDER — LIDOCAINE 2% (20 MG/ML) 5 ML SYRINGE
INTRAMUSCULAR | Status: DC | PRN
  Administered 2019-09-21: 80 mg via INTRAVENOUS

## 2019-09-21 MED ORDER — 0.9 % SODIUM CHLORIDE (POUR BTL) OPTIME
TOPICAL | Status: DC | PRN
  Administered 2019-09-21: 1000 mL

## 2019-09-21 MED ORDER — OXYCODONE HCL 5 MG PO TABS
ORAL_TABLET | ORAL | Status: AC
  Filled 2019-09-21: qty 1

## 2019-09-21 MED ORDER — VANCOMYCIN HCL IN DEXTROSE 1-5 GM/200ML-% IV SOLN: 1000.0000 mg | INTRAVENOUS | Status: AC

## 2019-09-21 MED ORDER — PROPOFOL 10 MG/ML IV BOLUS
INTRAVENOUS | Status: DC | PRN
  Administered 2019-09-21: 120 mg via INTRAVENOUS

## 2019-09-21 MED ORDER — HYDROMORPHONE HCL 1 MG/ML IJ SOLN
0.2500 mg | INTRAMUSCULAR | Status: DC | PRN
  Administered 2019-09-21: 0.25 mg via INTRAVENOUS

## 2019-09-21 MED ORDER — DEXAMETHASONE SODIUM PHOSPHATE 10 MG/ML IJ SOLN
INTRAMUSCULAR | Status: DC | PRN
  Administered 2019-09-21: 10 mg via INTRAVENOUS

## 2019-09-21 MED ORDER — FENTANYL CITRATE (PF) 100 MCG/2ML IJ SOLN
INTRAMUSCULAR | Status: DC | PRN
  Administered 2019-09-21: 100 ug via INTRAVENOUS
  Administered 2019-09-21: 50 ug via INTRAVENOUS

## 2019-09-21 MED ORDER — PROMETHAZINE HCL 25 MG/ML IJ SOLN: 6.2500 mg | INTRAMUSCULAR | Status: DC | PRN

## 2019-09-21 MED ORDER — SODIUM CHLORIDE 0.9 % IV SOLN: INTRAVENOUS | Status: DC | PRN

## 2019-09-21 MED ORDER — OXYCODONE HCL 5 MG/5ML PO SOLN: 5.0000 mg | Freq: Once | ORAL | Status: AC | PRN

## 2019-09-21 MED ORDER — LIDOCAINE-EPINEPHRINE 1 %-1:100000 IJ SOLN
INTRAMUSCULAR | Status: DC | PRN
  Administered 2019-09-21: 20 mL

## 2019-09-21 MED ORDER — VANCOMYCIN HCL IN DEXTROSE 1-5 GM/200ML-% IV SOLN
INTRAVENOUS | Status: AC
  Administered 2019-09-21: 10:00:00 1000 mg via INTRAVENOUS
  Filled 2019-09-21: qty 200

## 2019-09-21 MED ORDER — FENTANYL CITRATE (PF) 250 MCG/5ML IJ SOLN
INTRAMUSCULAR | Status: AC
  Filled 2019-09-21: qty 5

## 2019-09-21 SURGICAL SUPPLY — 32 items
BLADE CLIPPER SURG (BLADE) IMPLANT
CHLORAPREP W/TINT 26 (MISCELLANEOUS) ×2 IMPLANT
COVER WAND RF STERILE (DRAPES) ×2 IMPLANT
DERMABOND ADVANCED (GAUZE/BANDAGES/DRESSINGS) ×1
DERMABOND ADVANCED .7 DNX12 (GAUZE/BANDAGES/DRESSINGS) ×1 IMPLANT
DRAPE LAPAROTOMY 100X72X124 (DRAPES) ×2 IMPLANT
DRSG OPSITE POSTOP 3X4 (GAUZE/BANDAGES/DRESSINGS) ×4 IMPLANT
ELECT REM PT RETURN 9FT ADLT (ELECTROSURGICAL) ×2
ELECTRODE REM PT RTRN 9FT ADLT (ELECTROSURGICAL) ×1 IMPLANT
GLOVE BIOGEL PI IND STRL 7.5 (GLOVE) ×1 IMPLANT
GLOVE BIOGEL PI INDICATOR 7.5 (GLOVE) ×1
GLOVE ECLIPSE 7.5 STRL STRAW (GLOVE) ×2 IMPLANT
GLOVE EXAM NITRILE LRG STRL (GLOVE) IMPLANT
GLOVE EXAM NITRILE XL STR (GLOVE) IMPLANT
GLOVE EXAM NITRILE XS STR PU (GLOVE) IMPLANT
GOWN STRL REUS W/ TWL LRG LVL3 (GOWN DISPOSABLE) IMPLANT
GOWN STRL REUS W/TWL LRG LVL3 (GOWN DISPOSABLE)
KIT BASIN OR (CUSTOM PROCEDURE TRAY) ×2 IMPLANT
KIT TURNOVER KIT B (KITS) ×2 IMPLANT
NDL HYPO 25X1 1.5 SAFETY (NEEDLE) ×1 IMPLANT
NEEDLE HYPO 25X1 1.5 SAFETY (NEEDLE) ×2 IMPLANT
NS IRRIG 1000ML POUR BTL (IV SOLUTION) ×2 IMPLANT
PACK LAMINECTOMY NEURO (CUSTOM PROCEDURE TRAY) ×2 IMPLANT
PAD ARMBOARD 7.5X6 YLW CONV (MISCELLANEOUS) ×2 IMPLANT
SPONGE LAP 4X18 RFD (DISPOSABLE) IMPLANT
SUT MNCRL AB 4-0 PS2 18 (SUTURE) ×2 IMPLANT
SUT SILK 2 0 PERMA HAND 18 BK (SUTURE) IMPLANT
SUT VIC AB 2-0 CP2 18 (SUTURE) ×2 IMPLANT
TOWEL GREEN STERILE (TOWEL DISPOSABLE) ×2 IMPLANT
TOWEL GREEN STERILE FF (TOWEL DISPOSABLE) ×2 IMPLANT
WATER STERILE IRR 1000ML POUR (IV SOLUTION) ×2 IMPLANT
YANKAUER SUCT BULB TIP NO VENT (SUCTIONS) ×2 IMPLANT

## 2019-09-21 NOTE — Discharge Instructions (Signed)
Dr. Dyane Broberg Post-Op Orders ° °• Ice Pack - 20 minutes on (in a pillow case), and 20 minutes off. Wear the ice pack UNDER the binder. °• Follow up in office, they will call you for an appointment in 10 days to 2 weeks. °• Increase activity gradually.   °• No lifting anything heavier than a gallon of milk (10 pounds) until seen in the office. °• Advance diet slowly as tolerated. °• Dressing care:  Keep dressing dry for 3 days, and on Post-op day 4, may shower. °• Call for fever, drainage, and redness. °• No swimming or bathing in a bathtub (do not get into standing water). °•  °

## 2019-09-21 NOTE — Op Note (Signed)
PREOP DX 1) nonfunctional SCS 4) chronic pain  POSTOP DX: 1)nonfunctional SCS 4) chronic pain  PROCEDURES PERFORMED: 1) removal of SCS system SURGEON:Armetta Henri  ASSISTANT: NONE  ANESTHESIA: GETA EBL: <20cc  DESCRIPTION OF PROCEDURE: After a discussion of risks, benefits and alternatives, informed consent was obtained. The patient was taken to the OR, general anesthesia induced by the anesthesia team without difficulty, turned prone onto a Jackson table, all pressure points padded, SCD's placed. A timeout was taken to verify the correct patient, position, personnel, availability of appropriate equipment, and administration of perioperative antibiotics.  The thoracic and lumbar areas were widely prepped with chloraprep and draped into a sterile field. The skin and subcutaneous tissues around the patient's previous incisions were infiltrated with 0.25% lidocaine1:200K epinephrine. The subcutaneous pocket was incised  with a 10 blade and using sharp, careful dissection the pocket opened and the IPG delivered onto the field. Leads were cut and the IPG passed off. The leads were dissected free of scar tissue and freed.  The pocket was inspected for hemostasis, which was found to be excellent.  The other site was then incised, leads and anchors identified; leads were then exposed and delivered en toto. There was no bleeding or other fluid noted.    Both incisions were copiously irrigated with bacitracin-containing irrigation. The lumbar incision was closed in 2 deep layers of interrupted 2-0 vicryl and the skin closed with a running 4-0 monocryl subcuticular suture and dermabond. The pocket incision was closed with a deeper layer of 2-0 vicryl interrupted sutures, a second superficial  layer with a running 2-0 monocryl,  and the skin closed with a running 4-0 monocryl subcuticular suture and dermabond. Sterile dressings were applied. Needle, sponge, and instrument counts were correct x2 at the end of the  case.  The patient was then carefully awakened from anesthesia, turned supine, an abdominal binder placed, and the patient taken to the recovery room. COMPLICATIONS: NONE  CONDITION: Stable throughout the course of the procedure and immediately afterward  DISPOSITION: discharge to home. Discussed care with the patient and family member. Followup in clinic will be scheduled in 10-14 days.

## 2019-09-21 NOTE — Anesthesia Postprocedure Evaluation (Signed)
Anesthesia Post Note  Patient: Financial trader  Procedure(s) Performed: LUMBAR SPINAL CORD STIMULATOR REMOVAL (N/A )     Patient location during evaluation: PACU Anesthesia Type: General Level of consciousness: awake and alert Pain management: pain level controlled Vital Signs Assessment: post-procedure vital signs reviewed and stable Respiratory status: spontaneous breathing, nonlabored ventilation and respiratory function stable Cardiovascular status: blood pressure returned to baseline and stable Postop Assessment: no apparent nausea or vomiting Anesthetic complications: no    Last Vitals:  Vitals:   09/21/19 1215 09/21/19 1230  BP: 121/60   Pulse: 83   Resp: 15   Temp: (!) 36.4 C   SpO2: 96% 94%    Last Pain:  Vitals:   09/21/19 1215  TempSrc:   PainSc: 2                  Lowella Curb

## 2019-09-21 NOTE — Transfer of Care (Signed)
Immediate Anesthesia Transfer of Care Note  Patient: Anne Terrell  Procedure(s) Performed: LUMBAR SPINAL CORD STIMULATOR REMOVAL (N/A )  Patient Location: PACU  Anesthesia Type:General  Level of Consciousness: awake, alert  and oriented  Airway & Oxygen Therapy: Patient Spontanous Breathing and Patient connected to face mask oxygen  Post-op Assessment: Report given to RN and Post -op Vital signs reviewed and stable  Post vital signs: Reviewed and stable  Last Vitals:  Vitals Value Taken Time  BP 160/83 09/21/19 1142  Temp    Pulse 101 09/21/19 1145  Resp 22 09/21/19 1145  SpO2 92 % 09/21/19 1145  Vitals shown include unvalidated device data.  Last Pain:  Vitals:   09/21/19 0849  TempSrc:   PainSc: 0-No pain         Complications: No apparent anesthesia complications

## 2019-09-21 NOTE — Anesthesia Procedure Notes (Signed)
Procedure Name: Intubation Date/Time: 09/21/2019 10:28 AM Performed by: Clearnce Sorrel, CRNA Pre-anesthesia Checklist: Patient identified, Emergency Drugs available, Suction available and Patient being monitored Patient Re-evaluated:Patient Re-evaluated prior to induction Oxygen Delivery Method: Circle system utilized Preoxygenation: Pre-oxygenation with 100% oxygen Induction Type: IV induction Ventilation: Mask ventilation without difficulty and Oral airway inserted - appropriate to patient size Laryngoscope Size: Mac and 3 Grade View: Grade I Tube type: Oral Tube size: 7.0 mm Number of attempts: 1 Airway Equipment and Method: Stylet Placement Confirmation: ETT inserted through vocal cords under direct vision,  positive ETCO2 and breath sounds checked- equal and bilateral Secured at: 22 cm Tube secured with: Tape Dental Injury: Teeth and Oropharynx as per pre-operative assessment  Comments: SRNA intubated under CRNA supervision

## 2019-11-18 IMAGING — DX DG CHEST 2V
2 series · 2 of 2 positions shown · non-contrast
Comparison: No recent prior.

CLINICAL DATA: Osteoarthritis.  Hip replacement.

EXAM:
CHEST - 2 VIEW

[chest pa]
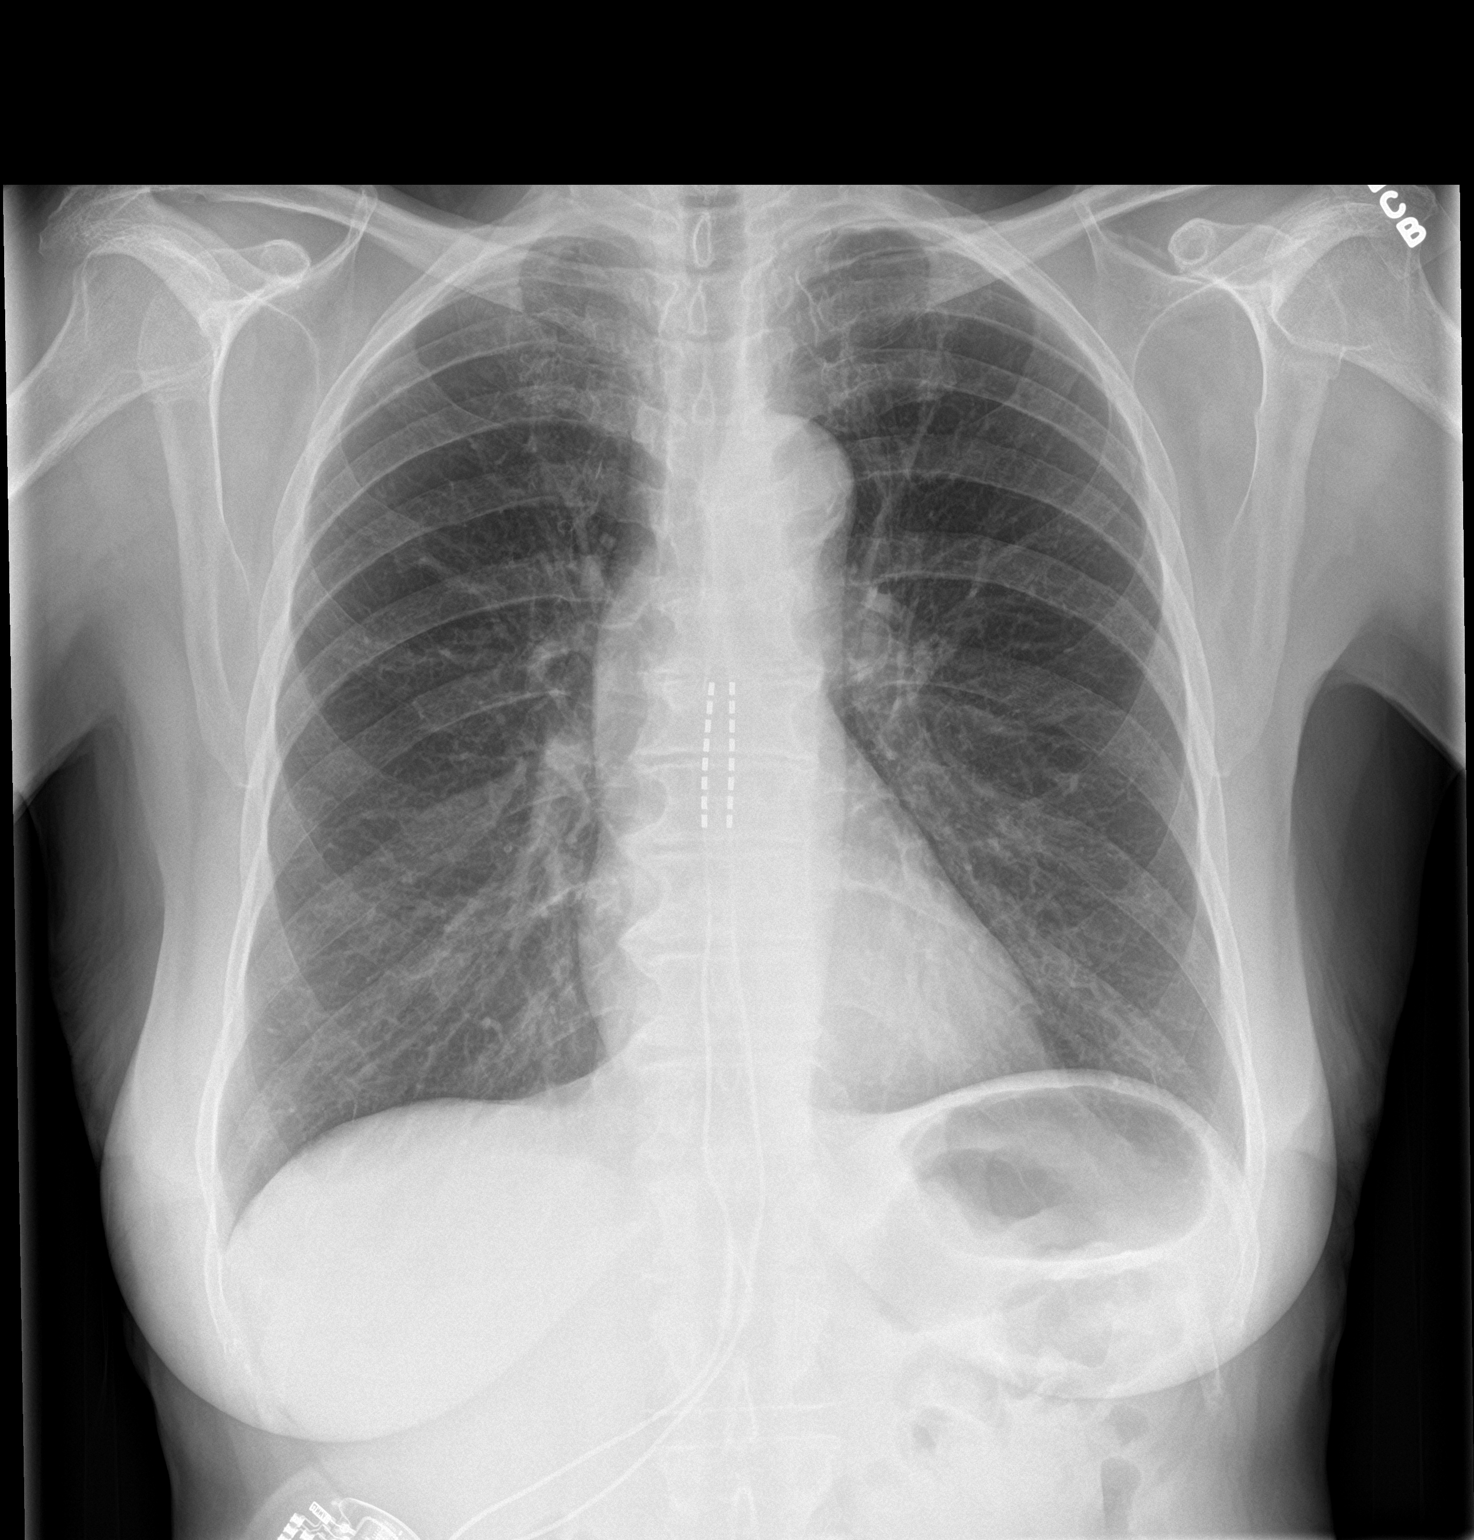

[chest lat]
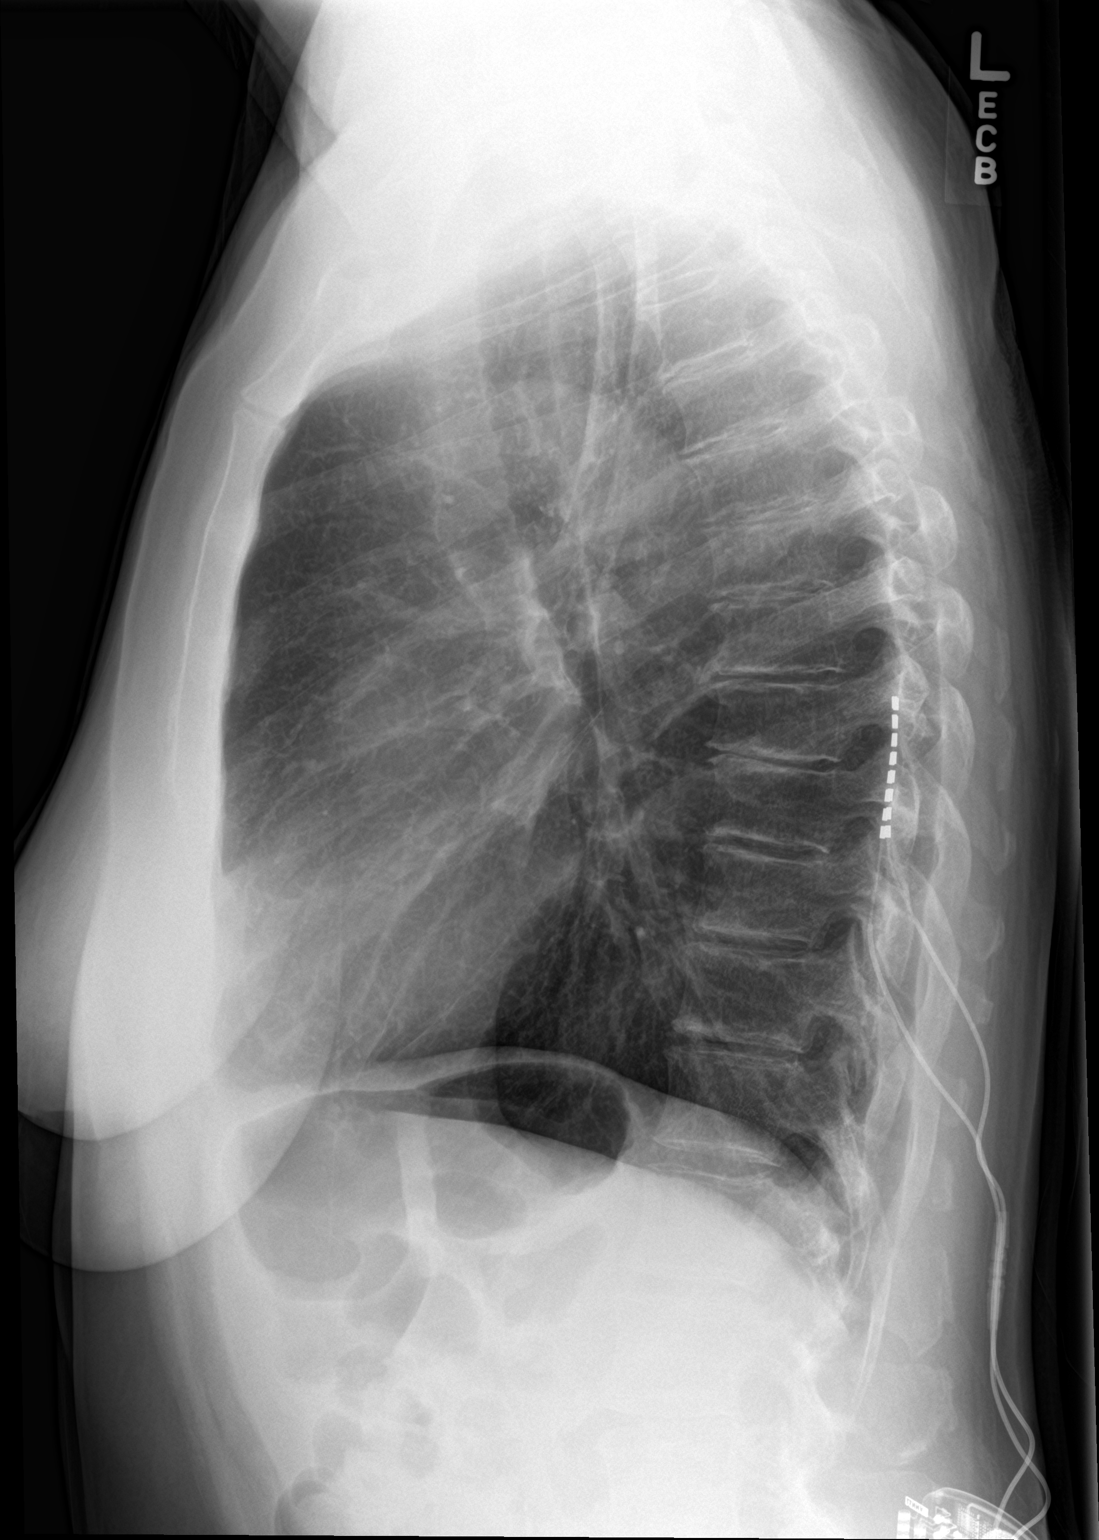

[2 of 2 positions shown; findings below may reference images not displayed]

FINDINGS: Mediastinum and hilar structures normal. Lungs are clear. No pleural
effusion or pneumothorax. Heart size normal. Thoracic spine
neurostimulator noted. No acute bony abnormality.
IMPRESSION: No acute cardiopulmonary disease.

## 2019-12-06 IMAGING — DX DG PORTABLE PELVIS
1 series · 1 of 1 positions shown · non-contrast
Comparison: 02/28/2018.

CLINICAL DATA: 77-year-old female post right hip replacement.
Initial encounter.

EXAM:
PORTABLE PELVIS 1-2 VIEWS

[pelvis ap]
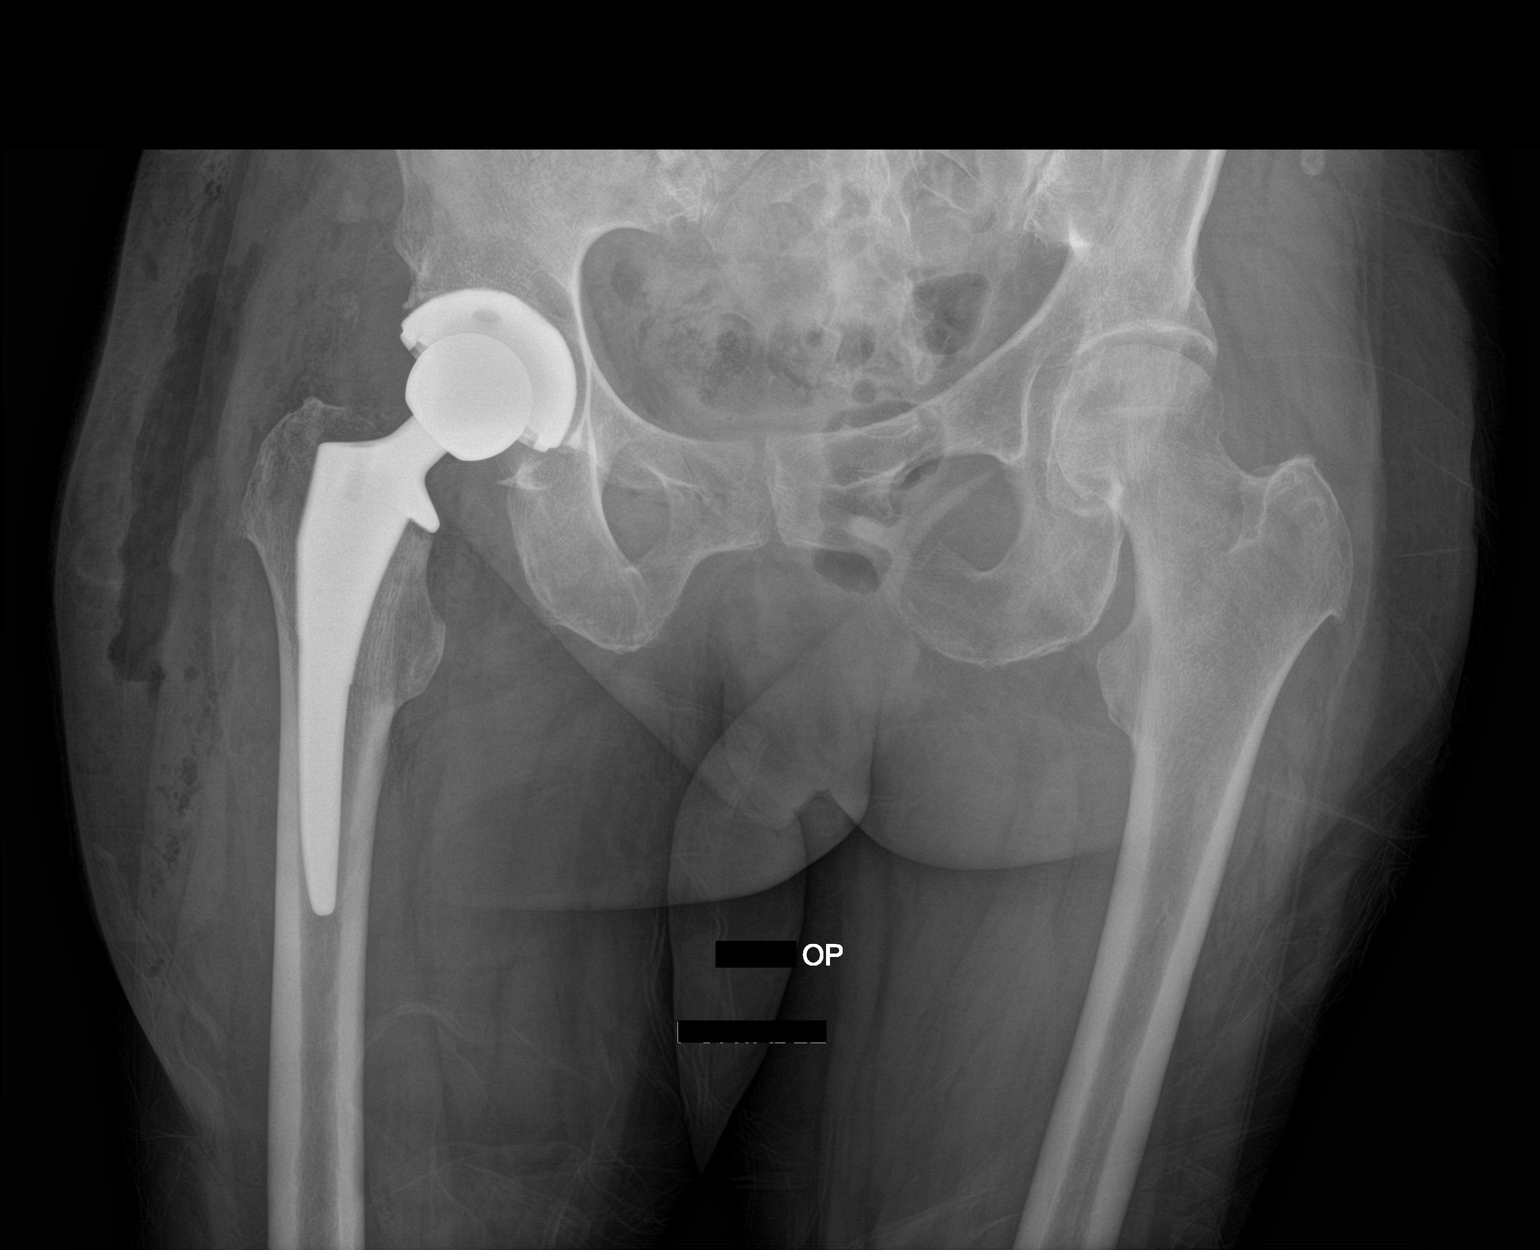

[1 of 1 positions shown; findings below may reference images not displayed]

FINDINGS: Post total right hip replacement which appears in satisfactory
position without complication noted on frontal projection.
IMPRESSION: Post total right hip replacement.

## 2020-03-12 ENCOUNTER — Ambulatory Visit: Payer: Medicare Other | Admitting: Orthopaedic Surgery

## 2020-03-19 ENCOUNTER — Ambulatory Visit: Payer: Medicare Other | Admitting: Orthopaedic Surgery

## 2020-04-01 ENCOUNTER — Ambulatory Visit (INDEPENDENT_AMBULATORY_CARE_PROVIDER_SITE_OTHER): Payer: Medicare Other | Admitting: Orthopaedic Surgery

## 2020-04-01 ENCOUNTER — Ambulatory Visit (INDEPENDENT_AMBULATORY_CARE_PROVIDER_SITE_OTHER): Payer: Medicare Other

## 2020-04-01 DIAGNOSIS — Z96641 Presence of right artificial hip joint: Secondary | ICD-10-CM

## 2020-04-01 NOTE — Progress Notes (Signed)
Office Visit Note   Patient: Anne Terrell           Date of Birth: Sep 02, 1941           MRN: [id number] Visit Date: 04/01/2020              Requested by: Jettie Pagan, NP [address] [place],  [place] [address] PCP: Eartha Inch, MD   Assessment & Plan: Visit Diagnoses:  1. Status post right hip replacement     Plan: From a hip replacement standpoint she is doing very well.  We will see her back in another year with standing AP pelvis x-rays.  For the back we will refer her to Dr. Otelia Sergeant per her request.  Follow-Up Instructions: Return for needs appt with Dr. Otelia Sergeant for chronic back pain.   Orders:  Orders Placed This Encounter  Procedures  . XR HIP UNILAT W OR W/O PELVIS 1V RIGHT   No orders of the defined types were placed in this encounter.     Procedures: No procedures performed   Clinical Data: No additional findings.   Subjective: Chief Complaint  Patient presents with  . Right Hip - Follow-up    Essynce is 2 years status post right total hip replacement.  She states that she is doing great and very happy with her outcome.  She states that her back is still severely symptomatic.  She had her spinal cord stimulator removed in January by Dr. Ollen Bowl.  She has no complaints regarding the right hip.   Review of Systems   Objective: Vital Signs: There were no vitals taken for this visit.  Physical Exam  Ortho Exam Right hip exam is unremarkable.  Fully healed surgical scar.  Painless range of motion. Specialty Comments:  No specialty comments available.  Imaging: XR HIP UNILAT W OR W/O PELVIS 1V RIGHT  Result Date: 04/01/2020 Stable right total hip replacement without complication.    PMFS History: Patient Active Problem List   Diagnosis Date Noted  . Exertional dyspnea 08/15/2019  . Screening cholesterol level 08/15/2019  . Precordial pain 08/14/2019  . SIRS (systemic inflammatory response syndrome) (HCC) 03/23/2018  .  Acute blood loss anemia 03/23/2018  . Status post right hip replacement 03/20/2018  . Primary osteoarthritis of right hip   . 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 Varicose veins of lower extremities with other complications 05/03/2014  . Major depressive disorder, recurrent episode, unspecified 04/11/2013  . Major depressive disorder, single episode, moderate (HCC) 01/05/2013  . Major depressive disorder, recurrent episode, severe, without mention of psychotic behavior 11/15/2012   Past Medical History:  Diagnosis Date  . Anxiety    09/20/2019- not current  . Arthralgia   . Carpal tunnel syndrome   . DDD (degenerative disc disease), cervical   . DDD (degenerative disc disease), lumbar   . Depression    09/21/19- situational - not current 09/21/2019  . Full dentures   . GERD (gastroesophageal reflux disease)    09/19/2018- not current  . History of hiatal hernia   . Migraine headache   . Myalgia   . Myofascial pain   . Osteoarthritis   . Primary localized osteoarthritis of right hip   . Right rotator cuff tear   . Spondylosis   . Varicose veins   . Wears glasses     Family History  Problem Relation Age of Onset  . Diabetes Mother   . Varicose Veins Mother   . Asthma Mother   .  Heart defect Sister     Past Surgical History:  Procedure Laterality Date  . ABDOMINAL HYSTERECTOMY  1988  . APPENDECTOMY    . CARPAL TUNNEL RELEASE Bilateral   . COLONOSCOPY WITH ESOPHAGOGASTRODUODENOSCOPY (EGD)    . dental posts     holds bottom dentures in place  . DILATION AND CURETTAGE OF UTERUS    . FRACTURE SURGERY Left    LLE  . MULTIPLE TOOTH EXTRACTIONS    . SPINAL CORD STIMULATOR INSERTION     located near the right hip and waist area  . SPINAL CORD STIMULATOR REMOVAL N/A 09/21/2019   Procedure: LUMBAR SPINAL CORD STIMULATOR REMOVAL;  Surgeon: Odette Fraction, MD;  Location: [facility] OR;  Service: Neurosurgery;  Laterality: N/A;  LUMBAR SPINAL CORD STIMULATOR REMOVAL  . TONSILLECTOMY    . TOTAL  HIP ARTHROPLASTY Right 03/20/2018   Procedure: RIGHT TOTAL HIP ARTHROPLASTY ANTERIOR APPROACH;  Surgeon: Tarry Kos, MD;  Location: [facility] ORS;  Service: Orthopedics;  Laterality: Right;   Social History   Occupational History  . Not on file  Tobacco Use  . Smoking status: Former Smoker    Packs/day: 1.00    Years: 30.00    Pack years: 30.00    Types: Cigarettes    Quit date: 05/03/2004    Years since quitting: 15.9  . Smokeless tobacco: Never Used  Vaping Use  . Vaping Use: Never used  Substance and Sexual Activity  . Alcohol use: No    Alcohol/week: 0.0 standard drinks  . Drug use: No  . Sexual activity: Never

## 2020-05-01 ENCOUNTER — Ambulatory Visit (INDEPENDENT_AMBULATORY_CARE_PROVIDER_SITE_OTHER): Payer: Medicare Other

## 2020-05-01 ENCOUNTER — Ambulatory Visit (INDEPENDENT_AMBULATORY_CARE_PROVIDER_SITE_OTHER): Payer: Medicare Other | Admitting: Specialist

## 2020-05-01 ENCOUNTER — Other Ambulatory Visit: Payer: Self-pay

## 2020-05-01 ENCOUNTER — Encounter: Payer: Self-pay | Admitting: Specialist

## 2020-05-01 VITALS — BP 129/82 | HR 75 | Ht 64.0 in | Wt 173.0 lb

## 2020-05-01 DIAGNOSIS — M545 Low back pain, unspecified: Secondary | ICD-10-CM

## 2020-05-01 DIAGNOSIS — M4815 Ankylosing hyperostosis [Forestier], thoracolumbar region: Secondary | ICD-10-CM | POA: Diagnosis not present

## 2020-05-01 DIAGNOSIS — M47816 Spondylosis without myelopathy or radiculopathy, lumbar region: Secondary | ICD-10-CM

## 2020-05-01 DIAGNOSIS — M4316 Spondylolisthesis, lumbar region: Secondary | ICD-10-CM | POA: Diagnosis not present

## 2020-05-01 DIAGNOSIS — M48062 Spinal stenosis, lumbar region with neurogenic claudication: Secondary | ICD-10-CM | POA: Diagnosis not present

## 2020-05-01 DIAGNOSIS — M546 Pain in thoracic spine: Secondary | ICD-10-CM

## 2020-05-01 DIAGNOSIS — M5136 Other intervertebral disc degeneration, lumbar region: Secondary | ICD-10-CM

## 2020-05-01 DIAGNOSIS — M5124 Other intervertebral disc displacement, thoracic region: Secondary | ICD-10-CM

## 2020-05-01 MED ORDER — DULOXETINE HCL 20 MG PO CPEP: 20.0000 mg | ORAL_CAPSULE | Freq: Every day | ORAL | 3 refills | Status: DC

## 2020-05-01 MED ORDER — CELECOXIB 200 MG PO CAPS: 200.0000 mg | ORAL_CAPSULE | Freq: Two times a day (BID) | ORAL | 3 refills | Status: DC

## 2020-05-01 NOTE — Patient Instructions (Signed)
Avoid bending, stooping and avoid lifting weights greater than 10 lbs. Avoid prolong standing and walking. Avoid frequent bending and stooping  No lifting greater than 10 lbs. May use ice or moist heat for pain. Weight loss is of benefit. Handicap license is approved. MRI of the thoracic and lumbar spine. Start celebrex. Cymbalta 20 mg per day

## 2020-05-01 NOTE — Progress Notes (Signed)
Office Visit Note   Patient: Anne Terrell           Date of Birth: [birth year]           MRN: [id number] Visit Date: 05/01/2020              Requested by: Eartha Inch, MD [address] [place],  [place] [address] PCP: Eartha Inch, MD   Assessment & Plan: Visit Diagnoses:  1. Low back pain, unspecified back pain laterality, unspecified chronicity, unspecified whether sciatica present   2. Spondylolisthesis of lumbar region   3. Spinal stenosis of lumbar region with neurogenic claudication   4. Forestier's disease of thoracolumbar region   5. Spondylolisthesis, lumbar region   6. Spondylosis without myelopathy or radiculopathy, lumbar region   7. Degenerative disc disease, lumbar   8. Back pain of thoracolumbar region   9. 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 Avoid prolong standing and walking. Avoid frequent bending and stooping  No lifting greater than 10 lbs. May use ice or moist heat for pain. Weight loss is of benefit. Handicap license is approved. MRI of the thoracic and lumbar spine. Start celebrex. Cymbalta 20 mg per day  Follow-Up Instructions: Return in about 3 weeks (around 05/22/2020).   Orders:  Orders Placed This Encounter  Procedures  . XR Lumbar Spine 2-3 Views  . MR Lumbar Spine w/o contrast  . MR THORACIC SPINE W WO CONTRAST   Meds ordered this encounter  Medications  . celecoxib (CELEBREX) 200 MG capsule    Sig: Take 1 capsule (200 mg total) by mouth 2 (two) times daily.    Dispense:  60 capsule    Refill:  3  . DULoxetine (CYMBALTA) 20 MG capsule    Sig: Take 1 capsule (20 mg total) by mouth daily.    Dispense:  30 capsule    Refill:  3      Procedures: No procedures performed   Clinical Data: No additional findings.   Subjective: Chief Complaint  Patient presents with  . Lower Back - Pain    79 year old female with  long history of low back pain and it is starting to creep upwards. She has pain above the waist with burning pain into the feet and the hands. In the past feet only then into the hands more recently. No diabetes, no history of neuropathy. She has a long history of back problems that date to 1999 and has had up to 3 MRIs with a notable disc protrusion at T11-12. Last MRI in 2009 with some improvement in the degree of cord compression and shift noted at this level in the past. The pain she has is constant and is presently a 6-7 when the meds wear off. She can Function with narcotic pain meds for 2-3 hours oxycodone 10/325 and tylenol 325mg  every 6 hours and gabapentin 300 mg every 4 hours. She has had change of pain management MD with retirement of previous MD and now Dr. is her pain management specialist. There are problems with pharmacies and with medication dispensing. She spends much time concerning medication availability.  She has not had surgery in the past except for right hip replacement The  Replacement has done well. She does not remember if she is taking the same amount of medication as she was prior to surgery. She had been taking fentanyl for years  and years And was then taken off of that and started on oxycodone 10 mg every 6 hours. She has back is worsened by moving around. There is downward pressure on the spine that is unbearable. She Has pain with standing and walking. She could not walk a mile and had in the past been extremely Active and this stopped, she was helping to take care of her husband with cancer till 2014. Her brother in law checks in on her daily. Legs are feeling weak and she tends to stoop or lean forward and lean on furniture and walls. She is able to grocery shop and uses the basket to lean on even in the parking lot. Some times she will have to stop when shopping. She has to lie down and go flat on  Her back to relieve the pain. Using a recliner is not good  enough. She is in bed most of the day. She will get up for about 2 hours then the spine starts to twist. She is not socializing, not going to church anymore about 1 year ago. Not able to exercise and can not remember when she stopped  Exercise. The hip surgery was successful and not the spine is a problem.  No bowel or bladder difficulties.    Review of Systems  Constitutional: Positive for unexpected weight change (weight gain probably for the last year.). Negative for activity change, appetite change, chills, diaphoresis, fatigue and fever.  HENT: Negative.  Negative for congestion, dental problem, drooling, ear discharge, ear pain, facial swelling, hearing loss, mouth sores, nosebleeds, postnasal drip, rhinorrhea, sinus pressure, sinus pain, sneezing, sore throat, tinnitus, trouble swallowing and voice change.   Eyes: Negative.  Negative for photophobia, pain, discharge, redness, itching and visual disturbance.  Respiratory: Negative.  Negative for apnea, cough, choking, chest tightness, shortness of breath, wheezing and stridor.   Cardiovascular: Negative.  Negative for chest pain, palpitations and leg swelling.  Gastrointestinal: Negative.  Negative for abdominal distention, abdominal pain, anal bleeding, blood in stool, constipation, diarrhea, nausea, rectal pain and vomiting.  Endocrine: Negative.  Negative for cold intolerance, heat intolerance, polyphagia and polyuria.  Genitourinary: Negative for difficulty urinating, dyspareunia, dysuria, enuresis, flank pain, frequency, hematuria, pelvic pain and urgency.  Musculoskeletal: Negative.  Negative for arthralgias, back pain, gait problem, joint swelling, myalgias, neck pain and neck stiffness.  Skin: Negative.  Negative for color change, pallor, rash and wound.  Allergic/Immunologic: Positive for food allergies (shellfish but iodine is okay). Negative for environmental allergies.  Neurological: Positive for weakness and numbness. Negative for  dizziness, tremors, seizures, syncope, facial asymmetry, speech difficulty, light-headedness and headaches.  Hematological: Negative.  Negative for adenopathy. Does not bruise/bleed easily.  Psychiatric/Behavioral: Positive for sleep disturbance. Negative for agitation, behavioral problems, confusion, decreased concentration, dysphoric mood, hallucinations, self-injury and suicidal ideas. The patient is not nervous/anxious and is not hyperactive.      Objective: Vital Signs: BP 129/82 (BP Location: Left Arm, Patient Position: Sitting)   Pulse 75   Ht 5\' 4"  (1.626 m)   Wt 173 lb (78.5 kg)   BMI 29.70 kg/m   Physical Exam Constitutional:      Appearance: She is well-developed.  HENT:     Head: Normocephalic and atraumatic.  Eyes:     Pupils: Pupils are equal, round, and reactive to light.  Pulmonary:     Effort: Pulmonary effort is normal.     Breath sounds: Normal breath sounds.  Abdominal:     General: Bowel sounds are  normal.     Palpations: Abdomen is soft.  Musculoskeletal:        General: Normal range of motion.     Cervical back: Normal range of motion and neck supple.  Skin:    General: Skin is warm and dry.  Neurological:     Mental Status: She is alert and oriented to person, place, and time.  Psychiatric:        Behavior: Behavior normal.        Thought Content: Thought content normal.        Judgment: Judgment normal.     Back Exam   Tenderness  The patient is experiencing tenderness in the lumbar.      Specialty Comments:  No specialty comments available.  Imaging: XR Lumbar Spine 2-3 Views  Result Date: 05/01/2020 AP and flexion and extention radiographs show DDD L5-S1 and L3-4 and L4-5, grade 1 anterolisthesis L4-5 greater than L3-4.  SI joints well maintained right total hip replacement well seated. Spondylosis L3-4 to L5-S1. Vacuum sign L5-S1.     PMFS History: Patient Active Problem List   Diagnosis Date Noted  . Exertional dyspnea  08/15/2019  . Screening cholesterol level 08/15/2019  . Precordial pain 08/14/2019  . SIRS (systemic inflammatory response syndrome) (HCC) 03/23/2018  . Acute blood loss anemia 03/23/2018  . Status post right hip replacement 03/20/2018  . Primary osteoarthritis of right hip   . 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 Varicose veins of lower extremities with other complications 05/03/2014  . Major depressive disorder, recurrent episode, unspecified 04/11/2013  . Major depressive disorder, single episode, moderate (HCC) 01/05/2013  . Major depressive disorder, recurrent episode, severe, without mention of psychotic behavior 11/15/2012   Past Medical History:  Diagnosis Date  . Anxiety    09/20/2019- not current  . Arthralgia   . Carpal tunnel syndrome   . DDD (degenerative disc disease), cervical   . DDD (degenerative disc disease), lumbar   . Depression    09/21/19- situational - not current 09/21/2019  . Full dentures   . GERD (gastroesophageal reflux disease)    09/19/2018- not current  . History of hiatal hernia   . Migraine headache   . Myalgia   . Myofascial pain   . Osteoarthritis   . Primary localized osteoarthritis of right hip   . Right rotator cuff tear   . Spondylosis   . Varicose veins   . Wears glasses     Family History  Problem Relation Age of Onset  . Diabetes Mother   . Varicose Veins Mother   . Asthma Mother   . Heart defect Sister     Past Surgical History:  Procedure Laterality Date  . ABDOMINAL HYSTERECTOMY  1988  . APPENDECTOMY    . CARPAL TUNNEL RELEASE Bilateral   . COLONOSCOPY WITH ESOPHAGOGASTRODUODENOSCOPY (EGD)    . dental posts     holds bottom dentures in place  . DILATION AND CURETTAGE OF UTERUS    . FRACTURE SURGERY Left    LLE  . MULTIPLE TOOTH EXTRACTIONS    . SPINAL CORD STIMULATOR INSERTION     located near the right hip and waist area  . SPINAL CORD STIMULATOR REMOVAL N/A 09/21/2019   Procedure: LUMBAR SPINAL CORD STIMULATOR REMOVAL;   Surgeon: Odette Fraction, MD;  Location: [facility] OR;  Service: Neurosurgery;  Laterality: N/A;  LUMBAR SPINAL CORD STIMULATOR REMOVAL  . TONSILLECTOMY    . TOTAL HIP ARTHROPLASTY Right 03/20/2018   Procedure: RIGHT TOTAL HIP ARTHROPLASTY  ANTERIOR APPROACH;  Surgeon: Tarry KosXu, Naiping M, MD;  Location: [facility] ORS;  Service: Orthopedics;  Laterality: Right;   Social History   Occupational History  . Not on file  Tobacco Use  . Smoking status: Former Smoker    Packs/day: 1.00    Years: 30.00    Pack years: 30.00    Types: Cigarettes    Quit date: 05/03/2004    Years since quitting: 16.0  . Smokeless tobacco: Never Used  Vaping Use  . Vaping Use: Never used  Substance and Sexual Activity  . Alcohol use: No    Alcohol/week: 0.0 standard drinks  . Drug use: No  . Sexual activity: Never

## 2020-05-26 ENCOUNTER — Other Ambulatory Visit: Payer: Self-pay

## 2020-05-26 ENCOUNTER — Ambulatory Visit
Admission: RE | Admit: 2020-05-26 | Discharge: 2020-05-26 | Disposition: A | Discharge status: Home / Self Care | Payer: Medicare Other | Source: Ambulatory Visit | Attending: Specialist | Admitting: Specialist

## 2020-05-26 DIAGNOSIS — M5124 Other intervertebral disc displacement, thoracic region: Secondary | ICD-10-CM

## 2020-05-26 DIAGNOSIS — M546 Pain in thoracic spine: Secondary | ICD-10-CM

## 2020-05-26 DIAGNOSIS — M4316 Spondylolisthesis, lumbar region: Secondary | ICD-10-CM

## 2020-05-26 DIAGNOSIS — M4815 Ankylosing hyperostosis [Forestier], thoracolumbar region: Secondary | ICD-10-CM

## 2020-05-26 MED ORDER — GADOBENATE DIMEGLUMINE 529 MG/ML IV SOLN
16.0000 mL | Freq: Once | INTRAVENOUS | Status: AC | PRN
  Administered 2020-05-26: 16 mL via INTRAVENOUS

## 2020-06-06 ENCOUNTER — Ambulatory Visit (INDEPENDENT_AMBULATORY_CARE_PROVIDER_SITE_OTHER): Payer: Medicare Other | Admitting: Specialist

## 2020-06-06 ENCOUNTER — Encounter: Payer: Self-pay | Admitting: Specialist

## 2020-06-06 ENCOUNTER — Other Ambulatory Visit: Payer: Self-pay

## 2020-06-06 VITALS — BP 131/85 | HR 91 | Ht 64.0 in | Wt 173.0 lb

## 2020-06-06 DIAGNOSIS — M4316 Spondylolisthesis, lumbar region: Secondary | ICD-10-CM

## 2020-06-06 DIAGNOSIS — M47816 Spondylosis without myelopathy or radiculopathy, lumbar region: Secondary | ICD-10-CM | POA: Diagnosis not present

## 2020-06-06 DIAGNOSIS — M5136 Other intervertebral disc degeneration, lumbar region: Secondary | ICD-10-CM | POA: Diagnosis not present

## 2020-06-06 NOTE — Patient Instructions (Signed)
Plan: Avoid bending, stooping and avoid lifting weights greater than 10 lbs. Avoid prolong standing and walking. Avoid frequent bending and stooping  No lifting greater than 10 lbs. May use ice or moist heat for pain. Weight loss is of benefit. Handicap license is approved. We need the MRI report then if it is indicated I will order the Myelogram and post myelogram CT scan of the lumbar spine as I believe the MRI is not demonstrating the pathology and I believe there is more dynamic shift at the level of the L4-5 spondylolisthesis.

## 2020-06-06 NOTE — Progress Notes (Signed)
Office Visit Note   Patient: Anne Terrell           Date of Birth: [birth year]           MRN: [id number] Visit Date: 06/06/2020              Requested by: Eartha Inch, MD [address] [place],  [place] [address] PCP: Eartha Inch, MD   Assessment & Plan: Visit Diagnoses:  1. Spondylolisthesis, lumbar region   2. Degenerative disc disease, lumbar   3. Spondylosis without myelopathy or radiculopathy, lumbar region    79 year old female with chronic painful spine condition. Has been treated with pain management, Preferred pain management and by Dr. Ollen Bowl with indwelling [place] stimulator. She is having ongoing severe incapacitating low back pain. Clinically pattern is consistent with spinal stenosis or severe spondylosis with stooping and leaning to relieve pain however sitting is not effective in relieve the discomfort and this is  worse than last year. Injections apparently were becoming ineffective and the spinal cord stimulator was attempted. The stimulator was not beneficial. Plain lateral flexion and extension radiographs demonstrate dynamic spondylolisthesis at L4-5 however review of the most recent MRI of the lumbar spine is not showing progression of slip or nerve compression. The final radiology interpretation is not available for review but viewing the study with the patient today it is apparent that the study is not showing the degree of slip seen on the plain radiographs from her prior visit. I will await the final report from radiology but am strongly considering repeating the lumbar myelogram and post myelogram CT scan with upright flexion and extension radiographs to document the progression of her slip to levels that are unstable, greater than 4-5 mm. Considering a 2 level lumbar fusion L4-5 and L5-S1 for dynamic L4-5 degenerative spondylolisthesis that is worsening and severe degenerative disc disease L5-S1 with mild biforamenal narrowing at this segment. Will  schedule myelogram after return of the written report on the lumbar MRI done.   Plan: Avoid bending, stooping and avoid lifting weights greater than 10 lbs. Avoid prolong standing and walking. Avoid frequent bending and stooping  No lifting greater than 10 lbs. May use ice or moist heat for pain. Weight loss is of benefit. Handicap license is approved. We need the MRI report then if it is indicated I will order the Myelogram and post myelogram CT scan of the lumbar spine as I believe the MRI is not demonstrating the pathology and I believe there is more dynamic shift at the level of the L4-5 spondylolisthesis.  Follow-Up Instructions: Return in about 3 weeks (around 06/27/2020).   Orders:  No orders of the defined types were placed in this encounter.  No orders of the defined types were placed in this encounter.     Procedures: No procedures performed   Clinical Data: Findings:  Narrative & Impression CLINICAL DATA:  Mid back pain.  Intervertebral disc disorder.  EXAM: MRI THORACIC WITHOUT AND WITH CONTRAST  TECHNIQUE: Multiplanar and multiecho pulse sequences of the thoracic spine were obtained without and with intravenous contrast.  CONTRAST:  56mL MULTIHANCE GADOBENATE DIMEGLUMINE 529 MG/ML IV SOLN  COMPARISON:  Thoracic and lumbar myelogram 11/06/2018  FINDINGS: MRI THORACIC SPINE FINDINGS  Alignment: No significant listhesis is present. Levoconvex curvature is centered at T7-8.  Vertebrae: Multilevel endplate degenerative changes are present from T5-6 through T11-12 in particular. Inferior endplate Schmorl's node is present at T11 with chronic fatty endplate marrow changes. Chronic  fatty endplate marrow changes are also noted at T6-7, T7-8, and T8-9. There is some chronic loss of vertebral body height at T8 and T9 in particular.  Cord:  Normal signal and morphology.  Paraspinal and other soft tissues: The paraspinous soft tissues are within  normal limits.  Disc levels:  No significant disc disease or stenosis is present in the upper thoracic spine through the T9-10 level. Central canal and foramina patent.  T10-11: Mild facet hypertrophy is present bilaterally without significant stenosis.  T11-12: A left paramedian disc protrusion is stable. No significant associated stenosis is present.  T12-L1: Negative.  No pathologic enhancement is present.  IMPRESSION: 1. Stable left paramedian disc protrusion at T11-12 without significant stenosis. 2. Mild facet hypertrophy at T10-11 without significant stenosis. 3. No acute abnormality or change in the thoracic spine.   Electronically Signed   By: Marin Roberts M.D.   On: 05/27/2020 08:47   MRI of the lumbar spine report not available for review however the study itself was reviewed with the patient and It shows mild right L4 neuroforamenal narrowing. Grade 1 anterolisthesis L4-5 of about 3 mm. Severe degenerative disc disease L5-S1 with bilateral mild foramenal narrowing L5 neuroforamen. No central stenosis. Disc dessication noted L3-4, L4-5 and L5-S1.    Subjective: Chief Complaint  Patient presents with  . Lower Back - Follow-up  . Middle Back - Follow-up    79 year old female with long history of back pain, buttock pain with radiation into the left leg greater than the right. She report pain that is severe and incapacitating to where she is force to stoop and twist to relieve the pain into the lower back and left leg. She is painful with standing and walking and bending and stooping and with sitting. There is a "pressure sensation into the small of her back that is painful". She reports that the discomfort is such that she goes home to her room and is unable to get out of her room or bed. She is taking oxycodone for the discomfort. Had previous pain management with Dr. Jordan Likes with injection treatments and eventually a spinal cord stimulator by Dr.  Ollen Bowl. In 10/2019 the spinal cord stimulator was discontinued due to it not being of benefit and discomfort associated with the battery. She was seen and evaluated with plain radiographs demonstrating spondylolisthesis at L4-5 and DDD worse at L5-S1 greater than L4-5 and minimal at L3-4. She has her brother at her side today to assess the results of the lumbar MRI scan and thoracic MRI done. Describes back pain as being greater than leg pain.   Review of Systems  Constitutional: Negative.   HENT: Negative.   Eyes: Negative.   Respiratory: Negative.   Cardiovascular: Negative.   Gastrointestinal: Negative.   Endocrine: Negative.   Genitourinary: Negative.   Musculoskeletal: Positive for back pain and gait problem.  Skin: Negative.   Allergic/Immunologic: Negative.   Neurological: Positive for weakness and numbness.  Hematological: Negative.   Psychiatric/Behavioral: Negative.      Objective: Vital Signs: BP 131/85   Pulse 91   Ht 5\' 4"  (1.626 m)   Wt 173 lb (78.5 kg)   BMI 29.70 kg/m   Physical Exam Constitutional:      Appearance: She is well-developed.  HENT:     Head: Normocephalic and atraumatic.  Eyes:     Pupils: Pupils are equal, round, and reactive to light.  Pulmonary:     Effort: Pulmonary effort is normal.  Breath sounds: Normal breath sounds.  Abdominal:     General: Bowel sounds are normal.     Palpations: Abdomen is soft.  Musculoskeletal:     Cervical back: Normal range of motion and neck supple.     Lumbar back: Negative right straight leg raise test and negative left straight leg raise test.  Skin:    General: Skin is warm and dry.  Neurological:     Mental Status: She is alert and oriented to person, place, and time.  Psychiatric:        Behavior: Behavior normal.        Thought Content: Thought content normal.        Judgment: Judgment normal.     Back Exam   Tenderness  The patient is experiencing tenderness in the lumbar.  Range of  Motion  Extension: abnormal  Flexion: normal  Lateral bend right: abnormal  Lateral bend left: abnormal   Muscle Strength  Right Quadriceps:  5/5  Left Quadriceps:  5/5  Right Hamstrings:  5/5  Left Hamstrings:  5/5   Tests  Straight leg raise right: negative Straight leg raise left: negative  Reflexes  Patellar: 1/4 Achilles: 0/4  Other  Toe walk: abnormal Heel walk: abnormal Sensation: normal Gait: antalgic  Erythema: no back redness Scars: present  Comments:  No focal motor weakness. Describes back pain as being greater than leg pain.      Specialty Comments:  No specialty comments available.  Imaging: No results found.   PMFS History: Patient Active Problem List   Diagnosis Date Noted  . Exertional dyspnea 08/15/2019  . Screening cholesterol level 08/15/2019  . Precordial pain 08/14/2019  . SIRS (systemic inflammatory response syndrome) (HCC) 03/23/2018  . Acute blood loss anemia 03/23/2018  . Status post right hip replacement 03/20/2018  . Primary osteoarthritis of right hip   . 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 Varicose veins of lower extremities with other complications 05/03/2014  . Major depressive disorder, recurrent episode, unspecified 04/11/2013  . Major depressive disorder, single episode, moderate (HCC) 01/05/2013  . Major depressive disorder, recurrent episode, severe, without mention of psychotic behavior 11/15/2012   Past Medical History:  Diagnosis Date  . Anxiety    09/20/2019- not current  . Arthralgia   . Carpal tunnel syndrome   . DDD (degenerative disc disease), cervical   . DDD (degenerative disc disease), lumbar   . Depression    09/21/19- situational - not current 09/21/2019  . Full dentures   . GERD (gastroesophageal reflux disease)    09/19/2018- not current  . History of hiatal hernia   . Migraine headache   . Myalgia   . Myofascial pain   . Osteoarthritis   . Primary localized osteoarthritis of right hip   . Right  rotator cuff tear   . Spondylosis   . Varicose veins   . Wears glasses     Family History  Problem Relation Age of Onset  . Diabetes Mother   . Varicose Veins Mother   . Asthma Mother   . Heart defect Sister     Past Surgical History:  Procedure Laterality Date  . ABDOMINAL HYSTERECTOMY  1988  . APPENDECTOMY    . CARPAL TUNNEL RELEASE Bilateral   . COLONOSCOPY WITH ESOPHAGOGASTRODUODENOSCOPY (EGD)    . dental posts     holds bottom dentures in place  . DILATION AND CURETTAGE OF UTERUS    . FRACTURE SURGERY Left    LLE  . MULTIPLE TOOTH  EXTRACTIONS    . SPINAL CORD STIMULATOR INSERTION     located near the right hip and waist area  . SPINAL CORD STIMULATOR REMOVAL N/A 09/21/2019   Procedure: LUMBAR SPINAL CORD STIMULATOR REMOVAL;  Surgeon: Odette Fraction, MD;  Location: [facility];  Service: Neurosurgery;  Laterality: N/A;  LUMBAR SPINAL CORD STIMULATOR REMOVAL  . TONSILLECTOMY    . TOTAL HIP ARTHROPLASTY Right 03/20/2018   Procedure: RIGHT TOTAL HIP ARTHROPLASTY ANTERIOR APPROACH;  Surgeon: Tarry Kos, MD;  Location: [facility];  Service: Orthopedics;  Laterality: Right;   Social History   Occupational History  . Not on file  Tobacco Use  . Smoking status: Former Smoker    Packs/day: 1.00    Years: 30.00    Pack years: 30.00    Types: Cigarettes    Quit date: 05/03/2004    Years since quitting: 16.1  . Smokeless tobacco: Never Used  Vaping Use  . Vaping Use: Never used  Substance and Sexual Activity  . Alcohol use: No    Alcohol/week: 0.0 standard drinks  . Drug use: No  . Sexual activity: Never

## 2020-08-25 ENCOUNTER — Other Ambulatory Visit: Payer: Self-pay | Admitting: Specialist

## 2020-12-16 ENCOUNTER — Other Ambulatory Visit: Payer: Self-pay | Admitting: Specialist

## 2021-01-15 ENCOUNTER — Other Ambulatory Visit: Payer: Self-pay | Admitting: Specialist

## 2021-03-31 ENCOUNTER — Ambulatory Visit (INDEPENDENT_AMBULATORY_CARE_PROVIDER_SITE_OTHER): Payer: Medicare Other | Admitting: Orthopaedic Surgery

## 2021-03-31 ENCOUNTER — Telehealth: Payer: Self-pay | Admitting: Specialist

## 2021-03-31 ENCOUNTER — Encounter: Payer: Self-pay | Admitting: Orthopaedic Surgery

## 2021-03-31 ENCOUNTER — Ambulatory Visit (INDEPENDENT_AMBULATORY_CARE_PROVIDER_SITE_OTHER): Payer: Medicare Other

## 2021-03-31 DIAGNOSIS — M25551 Pain in right hip: Secondary | ICD-10-CM

## 2021-03-31 NOTE — Telephone Encounter (Signed)
Patient came by. She would like her prescriptions sent to [facility] - [place], [place] - [address]

## 2021-03-31 NOTE — Progress Notes (Signed)
Patient: Anne Terrell           Date of Birth: [birth year]           MRN: [id number] Visit Date: 03/31/2021 PCP: Eartha Inch, MD   Assessment & Plan:  Chief Complaint:  Chief Complaint  Patient presents with   Right Hip - Pain   Visit Diagnoses:  1. Pain in right hip     Plan: [place] is 3 years status post right total hip replacement.  She comes in for routine follow-up.  No complaints.  She continues to enjoy gardening.  Right hip scar is fully healed.  She has normal gait and ambulation.  Normal range of motion without pain.  Delaila is done very well from her surgery.  At this point we will release her to follow-up as needed.  Questions encouraged and answered.  Follow-Up Instructions: Return if symptoms worsen or fail to improve.   Orders:  Orders Placed This Encounter  Procedures   XR Pelvis 1-2 Views   No orders of the defined types were placed in this encounter.   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   Varicose veins of lower extremities with other complications 05/03/2014   Major depressive disorder, recurrent episode, unspecified 04/11/2013   Major depressive disorder, single episode, moderate (HCC) 01/05/2013   Major depressive disorder, recurrent episode, severe, without mention of psychotic behavior 11/15/2012   Past Medical History:  Diagnosis Date   Anxiety    09/20/2019- not current   Arthralgia    Carpal tunnel syndrome    DDD (degenerative disc disease), cervical    DDD  (degenerative disc disease), lumbar    Depression    09/21/19- situational - not current 09/21/2019   Full dentures    GERD (gastroesophageal reflux disease)    09/19/2018- not current   History of hiatal hernia    Migraine headache    Myalgia    Myofascial pain    Osteoarthritis    Primary localized osteoarthritis of right hip    Right rotator cuff tear    Spondylosis    Varicose veins    Wears glasses     Family History  Problem Relation Age of Onset   Diabetes Mother    Varicose Veins Mother    Asthma Mother    Heart defect Sister     Past Surgical History:  Procedure Laterality Date   ABDOMINAL HYSTERECTOMY  1988   APPENDECTOMY     CARPAL TUNNEL RELEASE Bilateral    COLONOSCOPY WITH ESOPHAGOGASTRODUODENOSCOPY (EGD)     dental posts     holds bottom dentures in place   DILATION AND CURETTAGE OF UTERUS     FRACTURE SURGERY Left    LLE   MULTIPLE TOOTH EXTRACTIONS     SPINAL CORD STIMULATOR INSERTION     located near the right hip and waist area   SPINAL CORD STIMULATOR REMOVAL N/A 09/21/2019   Procedure: LUMBAR SPINAL CORD STIMULATOR REMOVAL;  Surgeon: Odette Fraction, MD;  Location: [facility];  Service: Neurosurgery;  Laterality: N/A;  LUMBAR SPINAL CORD STIMULATOR REMOVAL   TONSILLECTOMY     TOTAL HIP ARTHROPLASTY Right 03/20/2018   Procedure: RIGHT TOTAL HIP ARTHROPLASTY ANTERIOR APPROACH;  Surgeon: Tarry Kos, MD;  Location: [facility];  Service: Orthopedics;  Laterality: Right;   Social History   Occupational History   Not on file  Tobacco Use   Smoking status: Former    Packs/day: 1.00    Years: 30.00    Pack years: 30.00    Types: Cigarettes    Quit date: 05/03/2004    Years since quitting: 16.9   Smokeless tobacco: Never  Vaping Use   Vaping Use: Never used  Substance and Sexual Activity   Alcohol use: No    Alcohol/week: 0.0 standard drinks   Drug use: No   Sexual activity: Never

## 2021-03-31 NOTE — Telephone Encounter (Signed)
This is already selected in the chart.

## 2021-04-25 ENCOUNTER — Other Ambulatory Visit: Payer: Self-pay | Admitting: Specialist

## 2021-04-29 ENCOUNTER — Other Ambulatory Visit: Payer: Self-pay | Admitting: Radiology

## 2021-04-29 NOTE — Telephone Encounter (Signed)
Requesting 90 day supply.

## 2021-05-01 MED ORDER — DULOXETINE HCL 20 MG PO CPEP: ORAL_CAPSULE | ORAL | 3 refills | Status: DC

## 2021-05-01 MED ORDER — CELECOXIB 200 MG PO CAPS: ORAL_CAPSULE | ORAL | 3 refills | Status: DC

## 2021-12-22 DIAGNOSIS — F1120 Opioid dependence, uncomplicated: Secondary | ICD-10-CM | POA: Insufficient documentation

## 2021-12-22 DIAGNOSIS — G63 Polyneuropathy in diseases classified elsewhere: Secondary | ICD-10-CM | POA: Insufficient documentation

## 2022-01-24 ENCOUNTER — Other Ambulatory Visit: Payer: Self-pay | Admitting: Specialist

## 2022-02-18 DIAGNOSIS — N1831 Chronic kidney disease, stage 3a: Secondary | ICD-10-CM | POA: Insufficient documentation

## 2022-03-30 ENCOUNTER — Other Ambulatory Visit: Payer: Self-pay | Admitting: Physician Assistant

## 2022-03-30 DIAGNOSIS — R1011 Right upper quadrant pain: Secondary | ICD-10-CM

## 2022-03-30 DIAGNOSIS — R109 Unspecified abdominal pain: Secondary | ICD-10-CM

## 2022-04-05 ENCOUNTER — Ambulatory Visit
Admission: RE | Admit: 2022-04-05 | Discharge: 2022-04-05 | Disposition: A | Discharge status: Home / Self Care | Payer: Medicare Other | Source: Ambulatory Visit | Attending: Physician Assistant | Admitting: Physician Assistant

## 2022-04-05 DIAGNOSIS — R1011 Right upper quadrant pain: Secondary | ICD-10-CM

## 2022-04-05 DIAGNOSIS — R109 Unspecified abdominal pain: Secondary | ICD-10-CM

## 2022-04-22 DIAGNOSIS — I10 Essential (primary) hypertension: Secondary | ICD-10-CM | POA: Insufficient documentation

## 2022-08-24 ENCOUNTER — Ambulatory Visit: Payer: Medicare Other | Admitting: Orthopaedic Surgery

## 2022-09-28 ENCOUNTER — Ambulatory Visit (INDEPENDENT_AMBULATORY_CARE_PROVIDER_SITE_OTHER): Payer: Medicare Other

## 2022-09-28 ENCOUNTER — Ambulatory Visit (INDEPENDENT_AMBULATORY_CARE_PROVIDER_SITE_OTHER): Payer: Medicare Other | Admitting: Orthopaedic Surgery

## 2022-09-28 DIAGNOSIS — M25551 Pain in right hip: Secondary | ICD-10-CM | POA: Diagnosis not present

## 2022-09-28 NOTE — Progress Notes (Signed)
Office Visit Note   Patient: Anne Terrell           Date of Birth: 1941-05-06           MRN: [id number] Visit Date: 09/28/2022              Requested by: Chesley Noon, MD [place],  [place] [id number] PCP: Chesley Noon, MD   Assessment & Plan: Visit Diagnoses:  1. Pain in right hip     Plan: Impression is resolved right lateral hip pain.  Unclear as to what happened.  May be some bursitis or referred pain from the back.  Nevertheless happy that everything is resolved.  The hip replacement looks good and without any complications.  Follow-Up Instructions: No follow-ups on file.   Orders:  Orders Placed This Encounter  Procedures   XR HIP UNILAT W OR W/O PELVIS 2-3 VIEWS RIGHT   No orders of the defined types were placed in this encounter.     Procedures: No procedures performed   Clinical Data: No additional findings.   Subjective: Chief Complaint  Patient presents with   Right Hip - Pain    HPI Anne Terrell comes in for right hip pain for about 2 to 3 days on the lateral side.  Denies any injuries or change in activities.  She states that the pain has now resolved. Review of Systems  Constitutional: Negative.   HENT: Negative.    Eyes: Negative.   Respiratory: Negative.    Cardiovascular: Negative.   Endocrine: Negative.   Musculoskeletal: Negative.   Neurological: Negative.   Hematological: Negative.   Psychiatric/Behavioral: Negative.    All other systems reviewed and are negative.    Objective: Vital Signs: There were no vitals taken for this visit.  Physical Exam Vitals and nursing note reviewed.  Constitutional:      Appearance: She is well-developed.  HENT:     Head: Normocephalic and atraumatic.  Pulmonary:     Effort: Pulmonary effort is normal.  Abdominal:     Palpations: Abdomen is soft.  Musculoskeletal:     Cervical back: Neck supple.  Skin:    General: Skin is warm.     Capillary Refill:  Capillary refill takes less than 2 seconds.  Neurological:     Mental Status: She is alert and oriented to person, place, and time.  Psychiatric:        Behavior: Behavior normal.        Thought Content: Thought content normal.        Judgment: Judgment normal.     Right Hip Exam  Right hip exam is normal.   Range of Motion  The patient has normal right hip ROM.    Specialty Comments:  No specialty comments available.  Imaging: No results found.   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   Varicose veins of lower extremities with other complications 62/13/0865   Major depressive disorder, recurrent episode, unspecified 04/11/2013   Major depressive disorder, single episode, moderate ([place]) 01/05/2013   Major depressive disorder, recurrent episode, severe, without mention of psychotic behavior 11/15/2012   Past Medical History:  Diagnosis Date   Anxiety    09/20/2019- not current   Arthralgia    Carpal tunnel syndrome    DDD (degenerative disc disease), cervical    DDD (degenerative disc disease), lumbar    Depression    09/21/19- situational - not current 09/21/2019   Full dentures    GERD (gastroesophageal reflux disease)    09/19/2018- not current   History of hiatal hernia    Migraine headache    Myalgia    Myofascial pain    Osteoarthritis    Primary localized osteoarthritis of right hip    Right rotator cuff tear    Spondylosis    Varicose veins    Wears glasses     Family History  Problem Relation Age of Onset   Diabetes Mother    Varicose Veins Mother    Asthma Mother    Heart defect Sister     Past  Surgical History:  Procedure Laterality Date   ABDOMINAL HYSTERECTOMY  1988   APPENDECTOMY     CARPAL TUNNEL RELEASE Bilateral    COLONOSCOPY WITH ESOPHAGOGASTRODUODENOSCOPY (EGD)     dental posts     holds bottom dentures in place   DILATION AND CURETTAGE OF UTERUS     FRACTURE SURGERY Left    LLE   MULTIPLE TOOTH EXTRACTIONS     SPINAL CORD STIMULATOR INSERTION     located near the right hip and waist area   SPINAL CORD STIMULATOR REMOVAL N/A 09/21/2019   Procedure: LUMBAR SPINAL CORD STIMULATOR REMOVAL;  Surgeon: Clydell Hakim, MD;  Location: [facility];  Service: Neurosurgery;  Laterality: N/A;  LUMBAR SPINAL CORD STIMULATOR REMOVAL   TONSILLECTOMY     TOTAL HIP ARTHROPLASTY Right 03/20/2018   Procedure: RIGHT TOTAL HIP ARTHROPLASTY ANTERIOR APPROACH;  Surgeon: Leandrew Koyanagi, MD;  Location: [facility];  Service: Orthopedics;  Laterality: Right;   Social History   Occupational History   Not on file  Tobacco Use   Smoking status: Former    Packs/day: 1.00    Years: 30.00    Total pack years: 30.00    Types: Cigarettes    Quit date: 05/03/2004    Years since quitting: 18.4   Smokeless tobacco: Never  Vaping Use   Vaping Use: Never used  Substance and Sexual Activity   Alcohol use: No    Alcohol/week: 0.0 standard drinks of alcohol   Drug use: No   Sexual activity: Never

## 2022-10-05 ENCOUNTER — Ambulatory Visit (INDEPENDENT_AMBULATORY_CARE_PROVIDER_SITE_OTHER): Payer: Medicare Other | Admitting: Orthopedic Surgery

## 2022-10-05 ENCOUNTER — Ambulatory Visit (INDEPENDENT_AMBULATORY_CARE_PROVIDER_SITE_OTHER): Payer: Medicare Other

## 2022-10-05 ENCOUNTER — Encounter: Payer: Self-pay | Admitting: Orthopedic Surgery

## 2022-10-05 VITALS — BP 130/58 | HR 80 | Ht 63.0 in | Wt 169.0 lb

## 2022-10-05 DIAGNOSIS — M545 Low back pain, unspecified: Secondary | ICD-10-CM

## 2022-10-05 NOTE — Progress Notes (Signed)
Orthopedic Spine Surgery Office Note  Assessment: Patient is a 82 y.o. female with chronic (30 years) low back pain that got worse after a right THA in 2019   Plan: -Explained that initially conservative treatment is tried as a significant number of patients may experience relief with these treatment modalities. Discussed that the conservative treatments include:  -activity modification  -physical therapy  -over the counter pain medications  -medrol dosepak  -lumbar steroid injections -Patient has tried physical therapy, NSAIDs, Tylenol, gabapentin, oxycodone -She has no red flag symptoms and no recent worsening in her chronic low back pain, so do not recommend MRI at this time -Her pain seems consistent with facet arthropathy as it is worse with extension and is in the area of the arthritic facets so recommended facet steroid injections.  Referral was provided to Dr. Ernestina Patches.  If she gets good relief with this, she may be a candidate for RFA -Patient should return to office on an as-needed basis   Patient expressed understanding of the plan and all questions were answered to the patient's satisfaction.   ___________________________________________________________________________   History:  Patient is a 82 y.o. female who presents today for lumbar spine.  Patient has a 30-year history of low back pain.  There is no trauma or injury that brought on her pain.  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  Pain was noted to get worse after a hip replacement that was done in 2019.  She feels pain in the low back.  It does not radiate into the lower extremities.  Pain is worse if she extends the back.  She states if she stands upright or lays down at night that tends to make it worse.  She does feel better if she leans forward.    Weakness: Yes, when pain is severe.  No other weakness noted Symptoms of imbalance: Denies Paresthesias and  numbness: Denies Bowel or bladder incontinence: Denies Saddle anesthesia: Denies  Treatments tried: physical therapy, NSAIDs, Tylenol, gabapentin, oxycodone  Review of systems: Denies fevers and chills, night sweats, unexplained weight loss, history of cancer, pain that wakes them at night  Past medical history: Osteoarthritis DDD Migraines Hypertension Hyperlipidemia  Allergies: Penicillins, aspirin  Past surgical history:  Hysterectomy Carpal tunnel release Appendectomy D&C of uterus Left lower extremity fracture surgery Spinal cord stimulator insertion and subsequent removal Tonsillectomy Right THA  Social history: Denies use of nicotine product (smoking, vaping, patches, smokeless) Alcohol use: denies Denies recreational drug use   Physical Exam:  General: no acute distress, appears stated age Neurologic: alert, answering questions appropriately, following commands Respiratory: unlabored breathing on room air, symmetric chest rise Psychiatric: appropriate affect, normal cadence to speech   MSK (spine):  -Strength exam      Left  Right EHL    5/5  5/5 TA    5/5  5/5 GSC    5/5  5/5 Knee extension  5/5  5/5 Hip flexion   5/5  5/5  -Sensory exam    Sensation intact to light touch in L3-S1 nerve distributions of bilateral lower extremities  -Achilles DTR: 2/4 on the left, 2/4 on the right -Patellar tendon DTR: 2/4 on the left, 2/4 on the right  -Straight leg raise: Negative bilaterally -Femoral nerve stretch test: Negative bilaterally -Clonus: no beats bilaterally  -Left hip exam: No pain through range of motion, negative Stinchfield, negative FABER -Right hip exam: No pain to range of motion, negative  Stinchfield, negative FABER  Imaging: X-ray of the lumbar spine from 10/05/2022 was independently reviewed and interpreted, showing L4/5 spondylolisthesis that shifts less than 1 mm between flexion and extension views.  Significant disc height loss at  L5/S1.  Facet arthropathy at L3/4, L4/5, L5/S1.  No fracture or dislocation seen.  No evidence of instability on flexion/extension views.   Patient name: Anne Terrell Patient MRN: [id number] Date of visit: 10/05/22

## 2022-10-13 ENCOUNTER — Ambulatory Visit (INDEPENDENT_AMBULATORY_CARE_PROVIDER_SITE_OTHER): Payer: Medicare Other | Admitting: Physical Medicine and Rehabilitation

## 2022-10-13 ENCOUNTER — Ambulatory Visit: Payer: Self-pay

## 2022-10-13 VITALS — BP 120/80 | HR 76

## 2022-10-13 DIAGNOSIS — M47816 Spondylosis without myelopathy or radiculopathy, lumbar region: Secondary | ICD-10-CM | POA: Diagnosis not present

## 2022-10-13 MED ORDER — METHYLPREDNISOLONE ACETATE 80 MG/ML IJ SUSP: 80.0000 mg | Freq: Once | INTRAMUSCULAR | Status: DC

## 2022-10-13 NOTE — Patient Instructions (Signed)

## 2022-10-13 NOTE — Progress Notes (Signed)
Functional Pain Scale - descriptive words and definitions  Moderate (4)   Constantly aware of pain, can complete ADLs with modification/sleep marginally affected at times/passive distraction is of no use, but active distraction gives some relief. Moderate range order  Average Pain  varies   +Driver, -BT, -Dye Allergies.  Lower back pain on both sides that radiates in to both legs

## 2022-10-19 NOTE — Progress Notes (Signed)
Anne Terrell - 82 y.o. female MRN [id number]  Date of birth: Jul 06, 1941  Office Visit Note: Visit Date: 10/13/2022 PCP: Chesley Noon, MD Referred by: Callie Fielding, MD  Subjective: Chief Complaint  Patient presents with   Lower Back - Pain   HPI:  Anne Terrell is a 82 y.o. female who comes in today at the request of Dr. Ileene Rubens for planned Bilateral  L4-5 and L5-S1 Lumbar facet/medial branch block with fluoroscopic guidance.  The patient has failed conservative care including home exercise, medications, time and activity modification.  This injection will be diagnostic and hopefully therapeutic.  Please see requesting physician notes for further details and justification.  Exam has shown concordant pain with facet joint loading.   ROS Otherwise per HPI.  Assessment & Plan: Visit Diagnoses:    ICD-10-CM   1. Spondylosis without myelopathy or radiculopathy, lumbar region  M47.816 XR C-ARM NO REPORT    Facet Injection    DISCONTINUED: methylPREDNISolone acetate (DEPO-MEDROL) injection 80 mg      Plan: No additional findings.   Meds & Orders:  Meds ordered this encounter  Medications   DISCONTD: methylPREDNISolone acetate (DEPO-MEDROL) injection 80 mg    Orders Placed This Encounter  Procedures   Facet Injection   XR C-ARM NO REPORT    Follow-up: Return for visit to requesting provider as needed.   Procedures: No procedures performed  Lumbar Diagnostic Facet Joint Nerve Block with Fluoroscopic Guidance   Patient: Anne Terrell      Date of Birth: 08-02-1941 MRN: [id number] PCP: Chesley Noon, MD      Visit Date: 10/13/2022   Universal Protocol:    Date/Time: 02/13/248:14 AM  Consent Given By: the patient  Position: PRONE  Additional Comments: Vital signs were monitored before and after the procedure. Patient was prepped and draped in the usual sterile fashion. The correct patient, procedure, and site was verified.   Injection  Procedure Details:   Procedure diagnoses:  1. Spondylosis without myelopathy or radiculopathy, lumbar region      Meds Administered:  Meds ordered this encounter  Medications   DISCONTD: methylPREDNISolone acetate (DEPO-MEDROL) injection 80 mg     Laterality: Bilateral  Location/Site: L4-L5, L3 and L4 medial branches and L5-S1, L4 medial branch and L5 dorsal ramus  Needle: 5.0 in., 25 ga.  Short bevel or Quincke spinal needle  Needle Placement: Oblique pedical  Findings:   -Comments: There was excellent flow of contrast along the articular pillars without intravascular flow.  Procedure Details: The fluoroscope beam is vertically oriented in AP and then obliqued 15 to 20 degrees to the ipsilateral side of the desired nerve to achieve the "Scotty dog" appearance.  The skin over the target area of the junction of the superior articulating process and the transverse process (sacral ala if blocking the L5 dorsal rami) was locally anesthetized with a 1 ml volume of 1% Lidocaine without Epinephrine.  The spinal needle was inserted and advanced in a trajectory view down to the target.   After contact with periosteum and negative aspirate for blood and CSF, correct placement without intravascular or epidural spread was confirmed by injecting 0.5 ml. of Isovue-250.  A spot radiograph was obtained of this image.    Next, a 0.5 ml. volume of the injectate described above was injected. The needle was then redirected to the other facet joint nerves mentioned above if needed.  Prior to the procedure, the patient was given a Pain Diary which was completed  for baseline measurements.  After the procedure, the patient rated their pain every 30 minutes and will continue rating at this frequency for a total of 5 hours.  The patient has been asked to complete the Diary and return to [place] by mail, fax or hand delivered as soon as possible.   Additional Comments:  The patient tolerated the procedure  well Dressing: 2 x 2 sterile gauze and Band-Aid    Post-procedure details: Patient was observed during the procedure. Post-procedure instructions were reviewed.  Patient left the clinic in stable condition.   Clinical History: No specialty comments available.     Objective:  VS:  HT:    WT:   BMI:     BP:120/80  HR:76bpm  TEMP: ( )  RESP:  Physical Exam Vitals and nursing note reviewed.  Constitutional:      General: She is not in acute distress.    Appearance: Normal appearance. She is not ill-appearing.  HENT:     Head: Normocephalic and atraumatic.     Right Ear: External ear normal.     Left Ear: External ear normal.  Eyes:     Extraocular Movements: Extraocular movements intact.  Cardiovascular:     Rate and Rhythm: Normal rate.     Pulses: Normal pulses.  Pulmonary:     Effort: Pulmonary effort is normal. No respiratory distress.  Abdominal:     General: There is no distension.     Palpations: Abdomen is soft.  Musculoskeletal:        General: Tenderness present.     Cervical back: Neck supple.     Right lower leg: No edema.     Left lower leg: No edema.     Comments: Patient has good distal strength with no pain over the greater trochanters.  No clonus or focal weakness.  Skin:    Findings: No erythema, lesion or rash.  Neurological:     General: No focal deficit present.     Mental Status: She is alert and oriented to person, place, and time.     Sensory: No sensory deficit.     Motor: No weakness or abnormal muscle tone.     Coordination: Coordination normal.  Psychiatric:        Mood and Affect: Mood normal.        Behavior: Behavior normal.      Imaging: No results found.

## 2022-10-19 NOTE — Procedures (Signed)
Lumbar Diagnostic Facet Joint Nerve Block with Fluoroscopic Guidance   Patient: Anne Terrell      Date of Birth: 1941-08-12 MRN: [id number] PCP: Chesley Noon, MD      Visit Date: 10/13/2022   Universal Protocol:    Date/Time: 02/13/248:14 AM  Consent Given By: the patient  Position: PRONE  Additional Comments: Vital signs were monitored before and after the procedure. Patient was prepped and draped in the usual sterile fashion. The correct patient, procedure, and site was verified.   Injection Procedure Details:   Procedure diagnoses:  1. Spondylosis without myelopathy or radiculopathy, lumbar region      Meds Administered:  Meds ordered this encounter  Medications   DISCONTD: methylPREDNISolone acetate (DEPO-MEDROL) injection 80 mg     Laterality: Bilateral  Location/Site: L4-L5, L3 and L4 medial branches and L5-S1, L4 medial branch and L5 dorsal ramus  Needle: 5.0 in., 25 ga.  Short bevel or Quincke spinal needle  Needle Placement: Oblique pedical  Findings:   -Comments: There was excellent flow of contrast along the articular pillars without intravascular flow.  Procedure Details: The fluoroscope beam is vertically oriented in AP and then obliqued 15 to 20 degrees to the ipsilateral side of the desired nerve to achieve the "Scotty dog" appearance.  The skin over the target area of the junction of the superior articulating process and the transverse process (sacral ala if blocking the L5 dorsal rami) was locally anesthetized with a 1 ml volume of 1% Lidocaine without Epinephrine.  The spinal needle was inserted and advanced in a trajectory view down to the target.   After contact with periosteum and negative aspirate for blood and CSF, correct placement without intravascular or epidural spread was confirmed by injecting 0.5 ml. of Isovue-250.  A spot radiograph was obtained of this image.    Next, a 0.5 ml. volume of the injectate described above was  injected. The needle was then redirected to the other facet joint nerves mentioned above if needed.  Prior to the procedure, the patient was given a Pain Diary which was completed for baseline measurements.  After the procedure, the patient rated their pain every 30 minutes and will continue rating at this frequency for a total of 5 hours.  The patient has been asked to complete the Diary and return to [place] by mail, fax or hand delivered as soon as possible.   Additional Comments:  The patient tolerated the procedure well Dressing: 2 x 2 sterile gauze and Band-Aid    Post-procedure details: Patient was observed during the procedure. Post-procedure instructions were reviewed.  Patient left the clinic in stable condition.

## 2022-12-22 ENCOUNTER — Telehealth: Payer: Self-pay | Admitting: Physical Medicine and Rehabilitation

## 2022-12-22 NOTE — Telephone Encounter (Signed)
Patient asking fr an appointment for an injection

## 2022-12-23 ENCOUNTER — Other Ambulatory Visit: Payer: Self-pay | Admitting: Physical Medicine and Rehabilitation

## 2022-12-23 DIAGNOSIS — G8929 Other chronic pain: Secondary | ICD-10-CM

## 2022-12-23 DIAGNOSIS — M47816 Spondylosis without myelopathy or radiculopathy, lumbar region: Secondary | ICD-10-CM

## 2022-12-23 NOTE — Progress Notes (Signed)
Recent bilateral L4-L5 and L5-S1 medial branch blocks provided greater than 80% relief of pain for approximately 2 weeks. Will proceed with second set of facet blocks.

## 2022-12-23 NOTE — Telephone Encounter (Signed)
Spoke with patient and she is requesting an injection. She states the last one lasted about 2 weeks and gave at least 70% relief. No new falls, accidents or injuries. Please advise

## 2023-01-06 ENCOUNTER — Other Ambulatory Visit: Payer: Self-pay

## 2023-01-06 ENCOUNTER — Ambulatory Visit (INDEPENDENT_AMBULATORY_CARE_PROVIDER_SITE_OTHER): Payer: Medicare Other | Admitting: Physical Medicine and Rehabilitation

## 2023-01-06 VITALS — BP 120/70 | HR 80

## 2023-01-06 DIAGNOSIS — M545 Low back pain, unspecified: Secondary | ICD-10-CM | POA: Diagnosis not present

## 2023-01-06 DIAGNOSIS — M47816 Spondylosis without myelopathy or radiculopathy, lumbar region: Secondary | ICD-10-CM | POA: Diagnosis not present

## 2023-01-06 DIAGNOSIS — G8929 Other chronic pain: Secondary | ICD-10-CM | POA: Diagnosis not present

## 2023-01-06 MED ORDER — BUPIVACAINE HCL 0.5 % IJ SOLN: 3.0000 mL | Freq: Once | INTRAMUSCULAR | Status: DC

## 2023-01-06 NOTE — Progress Notes (Signed)
Functional Pain Scale - descriptive words and definitions  Moderate (4)   Constantly aware of pain, can complete ADLs with modification/sleep marginally affected at times/passive distraction is of no use, but active distraction gives some relief. Moderate range order  Average Pain 5   +Driver, -BT, -Dye Allergies.  Lower back pain on both sides with no radiation

## 2023-01-18 ENCOUNTER — Encounter: Payer: Self-pay | Admitting: Physical Medicine and Rehabilitation

## 2023-01-18 NOTE — Progress Notes (Signed)
Anne Terrell - 82 y.o. female MRN [id number]  Date of birth: [birth year]  Office Visit Note: Visit Date: 01/06/2023 PCP: Eartha Inch, MD Referred by: Eartha Inch, MD  Subjective: Chief Complaint  Patient presents with   Lower Back - Pain   HPI: Anne Terrell is a 82 y.o. female who comes in today for evaluation and management at the request of Ellin Goodie, FNP and Dr. Willia Craze for continued axial back pain without any referral to the legs.  She reports pain worse with standing going from sit to stand.  It imaging consistent with facet mediated pain which does go along with clinical exam with pain with extension and facet loading.  Did well with prior set of medial branch blocks and lumbar facet block.  Was really going to look at doing a second block but she says she is doing better at this point and her back pain is not limiting her to a great degree.  She rates it as moderate 4 out of 10 with some effect on activities of daily living but doing well otherwise.  No hip or groin pain.  No focal weakness.      Review of Systems  Musculoskeletal:  Positive for back pain and joint pain.  All other systems reviewed and are negative.  Otherwise per HPI.  Assessment & Plan: Visit Diagnoses:    ICD-10-CM   1. Spondylosis without myelopathy or radiculopathy, lumbar region  M47.816 DISCONTINUED: bupivacaine (MARCAINE) 0.5 % (with pres) injection 3 mL    CANCELED: XR C-ARM NO REPORT    CANCELED: Facet Injection    2. Chronic bilateral low back pain without sciatica  M54.50    G89.29        Plan: Findings:  If symptoms worsen I do think this is facet mediated low back pain at least to a big degree.  Diagnostically did well with almost 80% relief with prior diagnostic facet blocks.  Would look at second diagnostic block should her pain return.  Would look at radiofrequency ablation as part of a comprehensive pain management program.  She will continue to follow with  Dr. Christell Constant from a surgical standpoint.    Meds & Orders:  Meds ordered this encounter  Medications   DISCONTD: bupivacaine (MARCAINE) 0.5 % (with pres) injection 3 mL   No orders of the defined types were placed in this encounter.   Follow-up: No follow-ups on file.   Procedures: No procedures performed      Clinical History: ADDENDUM REPORT: 06/06/2020 12:25   ADDENDUM: Lumbar spine report was linked to the same report.   Segmentation: 5 non rib-bearing lumbar type vertebral bodies are present. The lowest fully formed vertebral body is L5.   Alignment: Slight anterolisthesis present at L3-4 and L4-5.   Vertebral bodies: Superior endplate Schmorl's node is present at L3. Schmorl's node at T11-12 has associated chronic fatty endplate marrow changes. Mild chronic fatty endplate marrow changes are present at L5-S1.   Conus medullaris and distal thoracic spinal cord are within normal limits. The tip is at L2.   Limited imaging the abdomen is unremarkable. There is no significant adenopathy. No solid organ lesions are present.   Disc levels: L1-2: Negative.   L2-3: Mild disc bulging is present. Facet hypertrophy is present on the right. No significant stenosis is present.   L3-4: A mild broad-based disc protrusion is present. Facet hypertrophy is worse on the right. Mild foraminal narrowing is present bilaterally. The central canal is  patent.   L4-5: Uncovering of a broad-based disc protrusion is present. Moderate facet hypertrophy is noted bilaterally. Moderate right and mild left foraminal stenosis is present.   L5-S1: Chronic thinning of the disc space is present. A mild broad-based protrusion is present. Mild bilateral foraminal stenosis is present.   Impressions:   1. Mild foraminal narrowing bilaterally at L3-4 secondary to disc bulging and facet hypertrophy. 2. Moderate right and mild left foraminal stenosis at L4-5 due to a broad-based disc protrusion and  asymmetric right-sided facet hypertrophy. 3. Chronic thinning of the disc with mild bilateral foraminal narrowing at L5-S1. 4. Slight anterolisthesis at L3-4 and L4-5.     Electronically Signed   By: Marin Roberts M.D.   On: 06/06/2020 12:25   She reports that she quit smoking about 18 years ago. Her smoking use included cigarettes. She has a 30.00 pack-year smoking history. She has never used smokeless tobacco. No results for input(s): "HGBA1C", "LABURIC" in the last 8760 hours.  Objective:  VS:  HT:    WT:   BMI:     BP:120/70  HR:80bpm  TEMP: ( )  RESP:  Physical Exam Vitals and nursing note reviewed.  Constitutional:      General: She is not in acute distress.    Appearance: Normal appearance. She is not ill-appearing.  HENT:     Head: Normocephalic and atraumatic.     Right Ear: External ear normal.     Left Ear: External ear normal.  Eyes:     Extraocular Movements: Extraocular movements intact.  Cardiovascular:     Rate and Rhythm: Normal rate.     Pulses: Normal pulses.  Pulmonary:     Effort: Pulmonary effort is normal. No respiratory distress.  Abdominal:     General: There is no distension.     Palpations: Abdomen is soft.  Musculoskeletal:        General: Tenderness present.     Cervical back: Neck supple.     Right lower leg: No edema.     Left lower leg: No edema.     Comments: Patient has good distal strength with no pain over the greater trochanters.  No clonus or focal weakness.  Skin:    Findings: No erythema, lesion or rash.  Neurological:     General: No focal deficit present.     Mental Status: She is alert and oriented to person, place, and time.     Sensory: No sensory deficit.     Motor: No weakness or abnormal muscle tone.     Coordination: Coordination normal.  Psychiatric:        Mood and Affect: Mood normal.        Behavior: Behavior normal.     Ortho Exam  Imaging: No results found.  Past Medical/Family/Surgical/Social  History: Medications & Allergies reviewed per EMR, new medications updated. 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   Varicose veins of lower extremities with other complications 05/03/2014   Major depressive disorder, recurrent episode, unspecified 04/11/2013   Major depressive disorder, single episode, moderate (HCC) 01/05/2013   Major depressive disorder, recurrent episode, severe, without mention of psychotic behavior 11/15/2012  Past Medical History:  Diagnosis Date   Anxiety    09/20/2019- not current   Arthralgia    Carpal tunnel syndrome    DDD (degenerative disc disease), cervical    DDD (degenerative disc disease), lumbar    Depression    09/21/19- situational - not current 09/21/2019   Full dentures    GERD (gastroesophageal reflux disease)    09/19/2018- not current   History of hiatal hernia    Migraine headache    Myalgia    Myofascial pain    Osteoarthritis    Primary localized osteoarthritis of right hip    Right rotator cuff tear    Spondylosis    Varicose veins    Wears glasses    Family History  Problem Relation Age of Onset   Diabetes Mother    Varicose Veins Mother    Asthma Mother    Heart defect Sister    Past Surgical History:  Procedure Laterality Date   ABDOMINAL HYSTERECTOMY  1988   APPENDECTOMY     CARPAL TUNNEL RELEASE Bilateral    COLONOSCOPY WITH ESOPHAGOGASTRODUODENOSCOPY (EGD)     dental posts     holds bottom dentures in place   DILATION AND CURETTAGE OF UTERUS     FRACTURE SURGERY Left    LLE   MULTIPLE TOOTH EXTRACTIONS     SPINAL CORD STIMULATOR INSERTION     located near the right hip and waist  area   SPINAL CORD STIMULATOR REMOVAL N/A 09/21/2019   Procedure: LUMBAR SPINAL CORD STIMULATOR REMOVAL;  Surgeon: Odette Fraction, MD;  Location: [facility] OR;  Service: Neurosurgery;  Laterality: N/A;  LUMBAR SPINAL CORD STIMULATOR REMOVAL   TONSILLECTOMY     TOTAL HIP ARTHROPLASTY Right 03/20/2018   Procedure: RIGHT TOTAL HIP ARTHROPLASTY ANTERIOR APPROACH;  Surgeon: Tarry Kos, MD;  Location: [facility] ORS;  Service: Orthopedics;  Laterality: Right;   Social History   Occupational History   Not on file  Tobacco Use   Smoking status: Former    Packs/day: 1.00    Years: 30.00    Additional pack years: 0.00    Total pack years: 30.00    Types: Cigarettes    Quit date: 05/03/2004    Years since quitting: 18.7   Smokeless tobacco: Never  Vaping Use   Vaping Use: Never used  Substance and Sexual Activity   Alcohol use: No    Alcohol/week: 0.0 standard drinks of alcohol   Drug use: No   Sexual activity: Never

## 2023-03-16 ENCOUNTER — Other Ambulatory Visit: Payer: Self-pay | Admitting: Physician Assistant

## 2023-03-16 DIAGNOSIS — M47816 Spondylosis without myelopathy or radiculopathy, lumbar region: Secondary | ICD-10-CM

## 2023-03-31 ENCOUNTER — Ambulatory Visit
Admission: RE | Admit: 2023-03-31 | Discharge: 2023-03-31 | Disposition: A | Discharge status: Home / Self Care | Payer: Medicare Other | Source: Ambulatory Visit | Attending: Physician Assistant | Admitting: Physician Assistant

## 2023-03-31 DIAGNOSIS — M47816 Spondylosis without myelopathy or radiculopathy, lumbar region: Secondary | ICD-10-CM

## 2023-04-26 ENCOUNTER — Ambulatory Visit: Payer: Medicare Other | Admitting: Orthopaedic Surgery

## 2023-05-06 ENCOUNTER — Other Ambulatory Visit (INDEPENDENT_AMBULATORY_CARE_PROVIDER_SITE_OTHER): Payer: Medicare Other

## 2023-05-06 ENCOUNTER — Ambulatory Visit: Payer: Medicare Other | Admitting: Orthopaedic Surgery

## 2023-05-06 DIAGNOSIS — M25532 Pain in left wrist: Secondary | ICD-10-CM

## 2023-05-06 DIAGNOSIS — M65332 Trigger finger, left middle finger: Secondary | ICD-10-CM | POA: Diagnosis not present

## 2023-05-06 DIAGNOSIS — R202 Paresthesia of skin: Secondary | ICD-10-CM | POA: Diagnosis not present

## 2023-05-06 MED ORDER — PREDNISONE 10 MG (21) PO TBPK: ORAL_TABLET | ORAL | 3 refills | Status: DC

## 2023-05-06 NOTE — Progress Notes (Signed)
Office Visit Note   Patient: Anne Terrell           Date of Birth: [birth year]           MRN: [id number] Visit Date: 05/06/2023              Requested by: Pollyann Samples, MD [address] [place],  [place] [address] PCP: Avel Peace Chales Salmon, MD   Assessment & Plan: Visit Diagnoses:  1. Paresthesias in left hand   2. Trigger finger, left middle finger   3. Pain in left wrist     Plan: Anne Terrell has several complaints today including left wrist pain, occasional tingling and numbness to the ring and small finger and a persistent left middle trigger finger.  In regards to the numbness and tingling I feel that this is minor cubital tunnel syndrome.  We talked about minimizing elbow flexion throughout the day as an initial way of helping with the symptoms.  She does not have any signs of severe ulnar neuropathy.  For the trigger finger she would like to have this surgically released.  Anne Terrell will call the patient to set up surgery time.  In regards to the left wrist pain impression is tendinosis along the palmaris longus.  I will send in a steroid for this.  Follow-Up Instructions: No follow-ups on file.   Orders:  Orders Placed This Encounter  Procedures  . XR Cervical Spine 2 or 3 views  . XR Wrist Complete Left   Meds ordered this encounter  Medications  . predniSONE (STERAPRED UNI-PAK 21 TAB) 10 MG (21) TBPK tablet    Sig: Take as directed    Dispense:  21 tablet    Refill:  3      Procedures: No procedures performed   Clinical Data: No additional findings.   Subjective: Chief Complaint  Patient presents with  . Left Hand - Pain    HPI patient is a pleasant 82 year old right-hand-dominant female who comes in today with pain and triggering to the left long finger as well as paresthesias to the left ring and small fingers.  Symptoms have been ongoing for about a month.  She is not a diabetic.  She is status post left carpal tunnel release from about 20 years ago.   She states she has chronic left wrist pain.  Review of Systems as detailed in HPI.  All others reviewed and are negative.   Objective: Vital Signs: There were no vitals taken for this visit.  Physical Exam well-developed well-nourished female no acute distress.  Alert and oriented x 3.  Ortho Exam left hand exam shows pain as well as a palpable nodule at the A1 pulley of the long finger.  She does have reproducible triggering.  Negative Phalen and negative Tinel at the wrist and elbow.  Cervical spine shows mild discomfort with range of motion.  Negative Spurling's.  She is neurovascularly intact distally.  Examination of the left wrist shows tenderness along the palmaris longus.  She has good wrist range of motion and strength to manual muscle testing.  Specialty Comments:  ADDENDUM REPORT: 06/06/2020 12:25   ADDENDUM: Lumbar spine report was linked to the same report.   Segmentation: 5 non rib-bearing lumbar type vertebral bodies are present. The lowest fully formed vertebral body is L5.   Alignment: Slight anterolisthesis present at L3-4 and L4-5.   Vertebral bodies: Superior endplate Schmorl's node is present at L3. Schmorl's node at T11-12 has associated chronic fatty endplate  marrow changes. Mild chronic fatty endplate marrow changes are present at L5-S1.   Conus medullaris and distal thoracic spinal cord are within normal limits. The tip is at L2.   Limited imaging the abdomen is unremarkable. There is no significant adenopathy. No solid organ lesions are present.   Disc levels: L1-2: Negative.   L2-3: Mild disc bulging is present. Facet hypertrophy is present on the right. No significant stenosis is present.   L3-4: A mild broad-based disc protrusion is present. Facet hypertrophy is worse on the right. Mild foraminal narrowing is present bilaterally. The central canal is patent.   L4-5: Uncovering of a broad-based disc protrusion is present. Moderate facet  hypertrophy is noted bilaterally. Moderate right and mild left foraminal stenosis is present.   L5-S1: Chronic thinning of the disc space is present. A mild broad-based protrusion is present. Mild bilateral foraminal stenosis is present.   Impressions:   1. Mild foraminal narrowing bilaterally at L3-4 secondary to disc bulging and facet hypertrophy. 2. Moderate right and mild left foraminal stenosis at L4-5 due to a broad-based disc protrusion and asymmetric right-sided facet hypertrophy. 3. Chronic thinning of the disc with mild bilateral foraminal narrowing at L5-S1. 4. Slight anterolisthesis at L3-4 and L4-5.     Electronically Signed   By: Marin Roberts M.D.   On: 06/06/2020 12:25  Imaging: XR Cervical Spine 2 or 3 views  Result Date: 05/06/2023 Chronic multilevel degenerative changes without any acute abnormalities.  XR Wrist Complete Left  Result Date: 05/06/2023 X-rays demonstrate generalized osteopenia.  No acute abnormalities.  No degenerative changes.    PMFS History: Patient Active Problem List   Diagnosis Date Noted  . Exertional dyspnea 08/15/2019  . Screening cholesterol level 08/15/2019  . Precordial pain 08/14/2019  . SIRS (systemic inflammatory response syndrome) (HCC) 03/23/2018  . Acute blood loss anemia 03/23/2018  . Status post right hip replacement 03/20/2018  . Primary osteoarthritis of right hip   . 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 Varicose veins of lower extremities with other complications 05/03/2014  . Major depressive disorder, recurrent episode, unspecified 04/11/2013  . Major depressive disorder, single episode, moderate (HCC) 01/05/2013  . Major depressive disorder, recurrent episode, severe, without mention of psychotic behavior 11/15/2012   Past Medical History:  Diagnosis Date  . Anxiety    09/20/2019- not current  . Arthralgia   . Carpal tunnel syndrome   . DDD (degenerative disc disease), cervical   . DDD (degenerative  disc disease), lumbar   . Depression    09/21/19- situational - not current 09/21/2019  . Full dentures   . GERD (gastroesophageal reflux disease)    09/19/2018- not current  . History of hiatal hernia   . Migraine headache   . Myalgia   . Myofascial pain   . Osteoarthritis   . Primary localized osteoarthritis of right hip   . Right rotator cuff tear   . Spondylosis   . Varicose veins   . Wears glasses     Family History  Problem Relation Age of Onset  . Diabetes Mother   . Varicose Veins Mother   . Asthma Mother   . Heart defect Sister     Past Surgical History:  Procedure Laterality Date  . ABDOMINAL HYSTERECTOMY  1988  . APPENDECTOMY    . CARPAL TUNNEL RELEASE Bilateral   . COLONOSCOPY WITH ESOPHAGOGASTRODUODENOSCOPY (EGD)    . dental posts     holds bottom dentures in place  . DILATION AND CURETTAGE OF  UTERUS    . FRACTURE SURGERY Left    LLE  . MULTIPLE TOOTH EXTRACTIONS    . SPINAL CORD STIMULATOR INSERTION     located near the right hip and waist area  . SPINAL CORD STIMULATOR REMOVAL N/A 09/21/2019   Procedure: LUMBAR SPINAL CORD STIMULATOR REMOVAL;  Surgeon: Odette Fraction, MD;  Location: [facility] OR;  Service: Neurosurgery;  Laterality: N/A;  LUMBAR SPINAL CORD STIMULATOR REMOVAL  . TONSILLECTOMY    . TOTAL HIP ARTHROPLASTY Right 03/20/2018   Procedure: RIGHT TOTAL HIP ARTHROPLASTY ANTERIOR APPROACH;  Surgeon: Tarry Kos, MD;  Location: [facility] ORS;  Service: Orthopedics;  Laterality: Right;   Social History   Occupational History  . Not on file  Tobacco Use  . Smoking status: Former    Current packs/day: 0.00    Average packs/day: 1 pack/day for 30.0 years (30.0 ttl pk-yrs)    Types: Cigarettes    Start date: 05/03/1974    Quit date: 05/03/2004    Years since quitting: 19.0  . Smokeless tobacco: Never  Vaping Use  . Vaping status: Never Used  Substance and Sexual Activity  . Alcohol use: No    Alcohol/week: 0.0 standard drinks of alcohol  . Drug use: No  .  Sexual activity: Never

## 2023-05-16 ENCOUNTER — Other Ambulatory Visit: Payer: Self-pay | Admitting: Physician Assistant

## 2023-05-16 MED ORDER — HYDROCODONE-ACETAMINOPHEN 5-325 MG PO TABS: 1.0000 | ORAL_TABLET | Freq: Three times a day (TID) | ORAL | 0 refills | Status: DC | PRN

## 2023-05-16 MED ORDER — ONDANSETRON HCL 4 MG PO TABS: 4.0000 mg | ORAL_TABLET | Freq: Three times a day (TID) | ORAL | 0 refills | Status: DC | PRN

## 2023-05-18 ENCOUNTER — Other Ambulatory Visit: Payer: Self-pay

## 2023-05-18 ENCOUNTER — Encounter (HOSPITAL_BASED_OUTPATIENT_CLINIC_OR_DEPARTMENT_OTHER): Payer: Self-pay | Admitting: Orthopaedic Surgery

## 2023-05-18 NOTE — Progress Notes (Signed)
   05/18/23 1021  PAT Phone Screen  Is the patient taking a GLP-1 receptor agonist? No  Do You Have Diabetes? No  Do You Have Hypertension? Yes  Have You Ever Been to the ER for Asthma? No  Have You Taken Oral Steroids in the Past 3 Months? (S)  Yes (05-06-23 had prednisone taper for wrist pain)  Do you Take Phenteramine or any Other Diet Drugs? No  Recent  Lab Work, EKG, CXR? No  Do you have a history of heart problems? (S)  No (08-16-19 ECHO EF 58%, 08-22-19 Myocardial stress test that was normal)  Any Recent Hospitalizations? No  Height 5\' 3"  (1.6 m)  Weight 77.1 kg  Pat Appointment Scheduled (S)  Yes (EKG)

## 2023-05-20 ENCOUNTER — Encounter (HOSPITAL_BASED_OUTPATIENT_CLINIC_OR_DEPARTMENT_OTHER)
Admission: RE | Admit: 2023-05-20 | Discharge: 2023-05-20 | Disposition: A | Discharge status: Home / Self Care | Payer: Medicare Other | Source: Ambulatory Visit | Attending: Orthopaedic Surgery | Admitting: Orthopaedic Surgery

## 2023-05-20 DIAGNOSIS — Z01810 Encounter for preprocedural cardiovascular examination: Secondary | ICD-10-CM | POA: Insufficient documentation

## 2023-05-20 DIAGNOSIS — I10 Essential (primary) hypertension: Secondary | ICD-10-CM | POA: Insufficient documentation

## 2023-05-20 NOTE — Progress Notes (Signed)

## 2023-05-25 ENCOUNTER — Ambulatory Visit (HOSPITAL_BASED_OUTPATIENT_CLINIC_OR_DEPARTMENT_OTHER): Payer: Medicare Other | Admitting: Certified Registered Nurse Anesthetist

## 2023-05-25 ENCOUNTER — Other Ambulatory Visit: Payer: Self-pay

## 2023-05-25 ENCOUNTER — Ambulatory Visit (HOSPITAL_BASED_OUTPATIENT_CLINIC_OR_DEPARTMENT_OTHER)
Admission: RE | Admit: 2023-05-25 | Discharge: 2023-05-25 | Disposition: A | Discharge status: Home / Self Care | Payer: Medicare Other | Attending: Orthopaedic Surgery | Admitting: Orthopaedic Surgery

## 2023-05-25 ENCOUNTER — Encounter (HOSPITAL_BASED_OUTPATIENT_CLINIC_OR_DEPARTMENT_OTHER)
Admission: RE | Disposition: A | Discharge status: Home / Self Care | Payer: Self-pay | Source: Home / Self Care | Attending: Orthopaedic Surgery

## 2023-05-25 ENCOUNTER — Encounter (HOSPITAL_BASED_OUTPATIENT_CLINIC_OR_DEPARTMENT_OTHER): Payer: Self-pay | Admitting: Orthopaedic Surgery

## 2023-05-25 DIAGNOSIS — M65332 Trigger finger, left middle finger: Secondary | ICD-10-CM | POA: Diagnosis not present

## 2023-05-25 DIAGNOSIS — Z6830 Body mass index (BMI) 30.0-30.9, adult: Secondary | ICD-10-CM | POA: Insufficient documentation

## 2023-05-25 DIAGNOSIS — Z87891 Personal history of nicotine dependence: Secondary | ICD-10-CM | POA: Insufficient documentation

## 2023-05-25 DIAGNOSIS — M65842 Other synovitis and tenosynovitis, left hand: Secondary | ICD-10-CM | POA: Insufficient documentation

## 2023-05-25 DIAGNOSIS — E785 Hyperlipidemia, unspecified: Secondary | ICD-10-CM | POA: Insufficient documentation

## 2023-05-25 DIAGNOSIS — E669 Obesity, unspecified: Secondary | ICD-10-CM | POA: Diagnosis not present

## 2023-05-25 DIAGNOSIS — I10 Essential (primary) hypertension: Secondary | ICD-10-CM

## 2023-05-25 HISTORY — PX: TRIGGER FINGER RELEASE: SHX641

## 2023-05-25 HISTORY — DX: Essential (primary) hypertension: I10

## 2023-05-25 HISTORY — DX: Opioid dependence, uncomplicated: F11.20

## 2023-05-25 SURGERY — RELEASE, A1 PULLEY, FOR TRIGGER FINGER
Anesthesia: Monitor Anesthesia Care | Site: Middle Finger | Laterality: Left

## 2023-05-25 MED ORDER — ONDANSETRON HCL 4 MG/2ML IJ SOLN: 4.0000 mg | Freq: Once | INTRAMUSCULAR | Status: DC | PRN

## 2023-05-25 MED ORDER — FENTANYL CITRATE (PF) 250 MCG/5ML IJ SOLN
INTRAMUSCULAR | Status: DC | PRN
  Administered 2023-05-25: 25 ug via INTRAVENOUS

## 2023-05-25 MED ORDER — LIDOCAINE-EPINEPHRINE (PF) 1 %-1:200000 IJ SOLN
INTRAMUSCULAR | Status: AC
  Filled 2023-05-25: qty 30

## 2023-05-25 MED ORDER — OXYCODONE HCL 5 MG PO TABS: 5.0000 mg | ORAL_TABLET | Freq: Once | ORAL | Status: DC | PRN

## 2023-05-25 MED ORDER — ONDANSETRON HCL 4 MG/2ML IJ SOLN
INTRAMUSCULAR | Status: AC
  Filled 2023-05-25: qty 2

## 2023-05-25 MED ORDER — OXYCODONE HCL 5 MG/5ML PO SOLN: 5.0000 mg | Freq: Once | ORAL | Status: DC | PRN

## 2023-05-25 MED ORDER — BUPIVACAINE HCL (PF) 0.25 % IJ SOLN
INTRAMUSCULAR | Status: AC
  Filled 2023-05-25: qty 30

## 2023-05-25 MED ORDER — LIDOCAINE HCL (PF) 1 % IJ SOLN
INTRAMUSCULAR | Status: AC
  Filled 2023-05-25: qty 30

## 2023-05-25 MED ORDER — CEFAZOLIN SODIUM-DEXTROSE 2-4 GM/100ML-% IV SOLN
INTRAVENOUS | Status: AC
  Filled 2023-05-25: qty 100

## 2023-05-25 MED ORDER — FENTANYL CITRATE (PF) 100 MCG/2ML IJ SOLN
INTRAMUSCULAR | Status: AC
  Filled 2023-05-25: qty 2

## 2023-05-25 MED ORDER — LIDOCAINE-EPINEPHRINE (PF) 1 %-1:200000 IJ SOLN
INTRAMUSCULAR | Status: DC | PRN
  Administered 2023-05-25: 6 mL via SUBCUTANEOUS

## 2023-05-25 MED ORDER — LACTATED RINGERS IV SOLN: INTRAVENOUS | Status: DC

## 2023-05-25 MED ORDER — CEFAZOLIN SODIUM-DEXTROSE 2-4 GM/100ML-% IV SOLN
2.0000 g | INTRAVENOUS | Status: AC
  Administered 2023-05-25: 2 g via INTRAVENOUS

## 2023-05-25 MED ORDER — LIDOCAINE 2% (20 MG/ML) 5 ML SYRINGE
INTRAMUSCULAR | Status: AC
  Filled 2023-05-25: qty 5

## 2023-05-25 MED ORDER — LIDOCAINE 2% (20 MG/ML) 5 ML SYRINGE
INTRAMUSCULAR | Status: DC | PRN
Start: 2023-05-25 — End: 2023-05-25
  Administered 2023-05-25: 60 mg via INTRAVENOUS

## 2023-05-25 MED ORDER — FENTANYL CITRATE (PF) 100 MCG/2ML IJ SOLN: 25.0000 ug | INTRAMUSCULAR | Status: DC | PRN

## 2023-05-25 MED ORDER — PROPOFOL 500 MG/50ML IV EMUL
INTRAVENOUS | Status: AC
  Filled 2023-05-25: qty 50

## 2023-05-25 MED ORDER — PROPOFOL 500 MG/50ML IV EMUL
INTRAVENOUS | Status: DC | PRN
  Administered 2023-05-25: 75 ug/kg/min via INTRAVENOUS

## 2023-05-25 MED ORDER — PROPOFOL 10 MG/ML IV BOLUS
INTRAVENOUS | Status: DC | PRN
  Administered 2023-05-25: 30 mg via INTRAVENOUS

## 2023-05-25 MED ORDER — 0.9 % SODIUM CHLORIDE (POUR BTL) OPTIME
TOPICAL | Status: DC | PRN
Start: 2023-05-25 — End: 2023-05-25
  Administered 2023-05-25: 120 mL

## 2023-05-25 SURGICAL SUPPLY — 45 items
BAND INSRT 18 STRL LF DISP RB (MISCELLANEOUS) ×2
BAND RUBBER #18 3X1/16 STRL (MISCELLANEOUS) ×2 IMPLANT
BLADE SURG 15 STRL LF DISP TIS (BLADE) ×1 IMPLANT
BLADE SURG 15 STRL SS (BLADE) ×1
BNDG CMPR 5X3 KNIT ELC UNQ LF (GAUZE/BANDAGES/DRESSINGS) ×1
BNDG CMPR 9X4 STRL LF SNTH (GAUZE/BANDAGES/DRESSINGS)
BNDG ELASTIC 3INX 5YD STR LF (GAUZE/BANDAGES/DRESSINGS) ×1 IMPLANT
BNDG ESMARK 4X9 LF (GAUZE/BANDAGES/DRESSINGS) IMPLANT
BRUSH SCRUB EZ PLAIN DRY (MISCELLANEOUS) ×1 IMPLANT
CANISTER SUCT 1200ML W/VALVE (MISCELLANEOUS) ×1 IMPLANT
CORD BIPOLAR FORCEPS 12FT (ELECTRODE) ×1 IMPLANT
COVER BACK TABLE 60X90IN (DRAPES) ×1 IMPLANT
COVER MAYO STAND STRL (DRAPES) ×1 IMPLANT
CUFF TOURN SGL QUICK 18X4 (TOURNIQUET CUFF) ×1 IMPLANT
DRAPE EXTREMITY T 121X128X90 (DISPOSABLE) ×1 IMPLANT
DRAPE SURG 17X23 STRL (DRAPES) ×1 IMPLANT
GAUZE SPONGE 4X4 12PLY STRL (GAUZE/BANDAGES/DRESSINGS) ×1 IMPLANT
GAUZE XEROFORM 1X8 LF (GAUZE/BANDAGES/DRESSINGS) ×1 IMPLANT
GLOVE BIOGEL PI IND STRL 7.0 (GLOVE) IMPLANT
GLOVE BIOGEL PI IND STRL 7.5 (GLOVE) ×1 IMPLANT
GLOVE ECLIPSE 7.0 STRL STRAW (GLOVE) ×1 IMPLANT
GLOVE INDICATOR 7.0 STRL GRN (GLOVE) ×1 IMPLANT
GLOVE SS BIOGEL STRL SZ 6.5 (GLOVE) IMPLANT
GLOVE SURG SYN 7.5 E (GLOVE) ×1
GLOVE SURG SYN 7.5 PF PI (GLOVE) ×1 IMPLANT
GOWN STRL REUS W/ TWL XL LVL3 (GOWN DISPOSABLE) ×1 IMPLANT
GOWN STRL REUS W/TWL XL LVL3 (GOWN DISPOSABLE) ×1
GOWN STRL SURGICAL XL XLNG (GOWN DISPOSABLE) ×1 IMPLANT
NDL HYPO 25X1 1.5 SAFETY (NEEDLE) ×1 IMPLANT
NEEDLE HYPO 25X1 1.5 SAFETY (NEEDLE) ×1
NS IRRIG 1000ML POUR BTL (IV SOLUTION) ×1 IMPLANT
PACK BASIN DAY SURGERY FS (CUSTOM PROCEDURE TRAY) ×1 IMPLANT
PAD CAST 3X4 CTTN HI CHSV (CAST SUPPLIES) ×1 IMPLANT
PADDING CAST COTTON 3X4 STRL (CAST SUPPLIES) ×1
SHEET MEDIUM DRAPE 40X70 STRL (DRAPES) ×1 IMPLANT
SPIKE FLUID TRANSFER (MISCELLANEOUS) IMPLANT
STOCKINETTE 4X48 STRL (DRAPES) ×1 IMPLANT
SUT ETHILON 3 0 PS 1 (SUTURE) IMPLANT
SUT ETHILON 4 0 PS 2 18 (SUTURE) ×1 IMPLANT
SYR BULB EAR ULCER 3OZ GRN STR (SYRINGE) ×1 IMPLANT
SYR CONTROL 10ML LL (SYRINGE) ×1 IMPLANT
TOWEL GREEN STERILE FF (TOWEL DISPOSABLE) ×1 IMPLANT
TRAY DSU PREP LF (CUSTOM PROCEDURE TRAY) ×1 IMPLANT
TUBE CONNECTING 20X1/4 (TUBING) ×1 IMPLANT
UNDERPAD 30X36 HEAVY ABSORB (UNDERPADS AND DIAPERS) ×1 IMPLANT

## 2023-05-25 NOTE — Anesthesia Postprocedure Evaluation (Signed)
Anesthesia Post Note  Patient: Anne Terrell  Procedure(s) Performed: LEFT MIDDLE TRIGGER FINGER RELEASE (Left: Middle Finger)     Patient location during evaluation: PACU Anesthesia Type: MAC Level of consciousness: awake and alert and oriented Pain management: pain level controlled Vital Signs Assessment: post-procedure vital signs reviewed and stable Respiratory status: spontaneous breathing, nonlabored ventilation and respiratory function stable Cardiovascular status: stable and blood pressure returned to baseline Postop Assessment: no apparent nausea or vomiting Anesthetic complications: no   No notable events documented.  Last Vitals:  Vitals:   05/25/23 1300 05/25/23 1313  BP:  (!) 137/48  Pulse: 78 74  Resp: (!) 22 16  Temp:  36.8 C  SpO2: 94% 94%    Last Pain:  Vitals:   05/25/23 1313  TempSrc: Temporal  PainSc: 0-No pain                 Lilley Hubble A.

## 2023-05-25 NOTE — Transfer of Care (Signed)
Immediate Anesthesia Transfer of Care Note  Patient: Anne Terrell  Procedure(s) Performed: LEFT MIDDLE TRIGGER FINGER RELEASE (Left: Middle Finger)  Patient Location: PACU  Anesthesia Type:MAC  Level of Consciousness: awake and patient cooperative  Airway & Oxygen Therapy: Patient Spontanous Breathing and Patient connected to face mask oxygen  Post-op Assessment: Report given to RN and Post -op Vital signs reviewed and stable  Post vital signs: Reviewed and stable  Last Vitals:  Vitals Value Taken Time  BP 99/56 05/25/23 1249  Temp 36.3 C 05/25/23 1248  Pulse 76 05/25/23 1253  Resp 20 05/25/23 1253  SpO2 99 % 05/25/23 1253  Vitals shown include unfiled device data.  Last Pain:  Vitals:   05/25/23 1248  TempSrc:   PainSc: Asleep      Patients Stated Pain Goal: 1 (05/25/23 1103)  Complications: No notable events documented.

## 2023-05-25 NOTE — Op Note (Signed)
   Date of Surgery: 05/25/2023  INDICATIONS: Anne Terrell is a 82 y.o.-year-old female who presents for surgical treatment of stenosing tenosynovitis of left long finger ;  The patient did consent to the procedure after discussion of the risks and benefits.  PREOPERATIVE DIAGNOSIS: Stenosing tenosynovitis left long finger  POSTOPERATIVE DIAGNOSIS: Same.  PROCEDURE: Tenolysis of left long finger FDS tendons with release of A1 pulley  SURGEON: Kenzy Campoverde Glee Arvin, M.D.  ASSIST: Starlyn Skeans [place], [place]; necessary for the timely completion of procedure and due to complexity of procedure..  ANESTHESIA:  local and MAC  IV FLUIDS AND URINE: See anesthesia.  ESTIMATED BLOOD LOSS: minimal mL  COMPLICATIONS: None.  DESCRIPTION OF PROCEDURE: The patient was identified in the preoperative holding area. The operative site was marked by the surgeon confirmed with the patient. He is brought back to the operating room. The patient was placed supine on table. A nonsterile tourniquet was placed on the arm. Local anesthetic was placed in the planned operative site. The operative extremity was prepped and draped in standard sterile fashion. Timeout was performed. Antibiotics were given. Timeout was performed.  Tourniquet was inflated to 250 mmHg.  A diagonal incision was made in the flexion crease over the third metacarpal head the left long finger. Blunt dissection was taken down to the level of the flexor tendon. The neurovascular bundles were identified on each side of the tendon sheath and protected.  The proximal edge of the A1 pulley was identified. This was sharply incised. Tenolysis of the flexor tendon was performed with tenotomy scissors. Care was taken not to disrupt the A2 pulley. The palmar pulley was then visualized and released also. The tourniquet was then deflated and hemostasis was obtained.  The wound was thoroughly irrigated and closed with 3-0 nylon sutures. Sterile dressings were applied  and the hand was placed in a soft dressing. Patient tolerated the procedure well and was taken to the PACU in stable condition.  POSTOPERATIVE PLAN: Patient will be weight bearing as tolerated and to avoid heavy lifting for 4 weeks.    Anne Reel, MD 12:34 PM

## 2023-05-25 NOTE — H&P (Signed)
PREOPERATIVE H&P  Chief Complaint: left middle trigger finger  HPI: Anne Terrell is a 82 y.o. female who presents for surgical treatment of left middle trigger finger.  She denies any changes in medical history.  Past Surgical History:  Procedure Laterality Date   ABDOMINAL HYSTERECTOMY  1988   APPENDECTOMY     CARPAL TUNNEL RELEASE Bilateral    COLONOSCOPY WITH ESOPHAGOGASTRODUODENOSCOPY (EGD)     dental posts     holds bottom dentures in place   DILATION AND CURETTAGE OF UTERUS     FRACTURE SURGERY Left    LLE   MULTIPLE TOOTH EXTRACTIONS     SPINAL CORD STIMULATOR INSERTION     located near the right hip and waist area   SPINAL CORD STIMULATOR REMOVAL N/A 09/21/2019   Procedure: LUMBAR SPINAL CORD STIMULATOR REMOVAL;  Surgeon: Odette Fraction, MD;  Location: [facility] OR;  Service: Neurosurgery;  Laterality: N/A;  LUMBAR SPINAL CORD STIMULATOR REMOVAL   TONSILLECTOMY     TOTAL HIP ARTHROPLASTY Right 03/20/2018   Procedure: RIGHT TOTAL HIP ARTHROPLASTY ANTERIOR APPROACH;  Surgeon: Tarry Kos, MD;  Location: [facility] OR[facility];  Service: Orthopedics;  Laterality: Right;   Social History   Socioeconomic History   Marital status: Widowed    Spouse name: Not on file   Number of children: 0   Years of education: Not on file   Highest education level: Some college, no degree  Occupational History   Not on file  Tobacco Use   Smoking status: Former    Current packs/day: 0.00    Average packs/day: 1 pack/day for 30.0 years (30.0 ttl pk-yrs)    Types: Cigarettes    Start date: 05/03/1974    Quit date: 05/03/2004    Years since quitting: 19.0   Smokeless tobacco: Never  Vaping Use   Vaping status: Never Used  Substance and Sexual Activity   Alcohol use: No    Alcohol/week: 0.0 standard drinks of alcohol   Drug use: No   Sexual activity: Not Currently  Other Topics Concern   Not on file  Social History Narrative   Lives at home alone   Right handed   2 cups of caffeine daily    Social Determinants of Health   Financial Resource Strain: Low Risk  (04/08/2023)   Received from [facility]   Overall Financial Resource Strain (CARDIA)    Difficulty of Paying Living Expenses: Not hard at all  Food Insecurity: No Food Insecurity (04/08/2023)   [facility]   Hunger Vital Sign    Worried About Running Out of Food in the Last Year: Never true    Ran Out of Food in the Last Year: Never true  Transportation Needs: No Transportation Needs (04/08/2023)   Received from [facility] - Transportation    Lack of Transportation (Medical): No    Lack of Transportation (Non-Medical): No  Physical Activity: Inactive (12/22/2021)   Received from [facility], [facility]   Exercise Vital Sign    Days of Exercise per Week: 0 days    Minutes of Exercise per Session: 0 min  Stress: No Stress Concern Present (12/22/2021)   Received from [facility], [facility] - Occupational Stress Questionnaire    Feeling of Stress : Not at all  Social Connections: Unknown (12/23/2022)   Received from [facility], [facility]   Social Network    Social Network: Not on file  Family History  Problem Relation Age of Onset   Diabetes Mother    Varicose Veins Mother    Asthma Mother    Heart defect Sister    Allergies  Allergen Reactions   Penicillins Rash    Did it involve swelling of the face/tongue/throat, SOB, or low BP? No Did it involve sudden or severe rash/hives, skin peeling, or any reaction on the inside of your mouth or nose? No Did you need to seek medical attention at a hospital or doctor's office? No When did it last happen?      30 + years If all above answers are "NO", may proceed with cephalosporin use.    Shellfish Allergy Nausea And Vomiting and Other (See Comments)    Esophageal spasms and vomiting per pt   Aspirin Other (See Comments)    "Stomach pain"   Prior to Admission medications    Medication Sig Start Date End Date Taking? Authorizing Provider  gabapentin (NEURONTIN) 400 MG capsule Take 400 mg by mouth 4 (four) times daily.   Yes [provider]  HYDROcodone-acetaminophen (NORCO) 5-325 MG tablet Take 1 tablet by mouth 3 (three) times daily as needed. To be taken after surgery 05/16/23   Cristie Hem, PA-C  lisinopril (ZESTRIL) 10 MG tablet Take 10 mg by mouth daily.   Yes [provider]  ondansetron (ZOFRAN) 4 MG tablet Take 1 tablet (4 mg total) by mouth every 8 (eight) hours as needed for nausea or vomiting. 05/16/23   Cristie Hem, PA-C  oxyCODONE-acetaminophen (PERCOCET) 10-325 MG tablet Take 1 tablet by mouth every 6 (six) hours as needed. 04/20/20  Yes [provider]  rosuvastatin (CRESTOR) 10 MG tablet Take 10 mg by mouth daily.   Yes [provider]     Positive ROS: All other systems have been reviewed and were otherwise negative with the exception of those mentioned in the HPI and as above.  Physical Exam: General: Alert, no acute distress Cardiovascular: No pedal edema Respiratory: No cyanosis, no use of accessory musculature GI: abdomen soft Skin: No lesions in the area of chief complaint Neurologic: Sensation intact distally Psychiatric: Patient is competent for consent with normal mood and affect Lymphatic: no lymphedema  MUSCULOSKELETAL: exam stable  Assessment: left middle trigger finger  Plan: Plan for Procedure(s): LEFT MIDDLE TRIGGER FINGER RELEASE  The risks benefits and alternatives were discussed with the patient including but not limited to the risks of nonoperative treatment, versus surgical intervention including infection, bleeding, nerve injury,  blood clots, cardiopulmonary complications, morbidity, mortality, among others, and they were willing to proceed.   Glee Arvin, MD 05/25/2023 10:46 AM

## 2023-05-25 NOTE — Anesthesia Preprocedure Evaluation (Signed)
Anesthesia Evaluation  Patient identified by MRN, date of birth, ID band Patient awake    Reviewed: Allergy & Precautions, NPO status , Patient's Chart, lab work & pertinent test results  Airway Mallampati: II  TM Distance: >3 FB Neck ROM: Full    Dental  (+) Edentulous Upper, Edentulous Lower   Pulmonary former smoker   Pulmonary exam normal breath sounds clear to auscultation       Cardiovascular hypertension, Pt. on medications Normal cardiovascular exam Rhythm:Regular Rate:Normal     Neuro/Psych  PSYCHIATRIC DISORDERS  Depression     Neuromuscular disease    GI/Hepatic Neg liver ROS, hiatal hernia,,,  Endo/Other  Obesity Hyperlipidemia  Renal/GU negative Renal ROS  negative genitourinary   Musculoskeletal  (+) Arthritis , Osteoarthritis,  Left middle finger trigger finger   Abdominal  (+) + obese  Peds  Hematology  (+) Blood dyscrasia, anemia   Anesthesia Other Findings   Reproductive/Obstetrics                             Anesthesia Physical Anesthesia Plan  ASA: 2  Anesthesia Plan: MAC   Post-op Pain Management: Minimal or no pain anticipated and Regional block*   Induction: Intravenous  PONV Risk Score and Plan: 3 and Treatment may vary due to age or medical condition and Propofol infusion  Airway Management Planned: Natural Airway and Simple Face Mask  Additional Equipment: None  Intra-op Plan:   Post-operative Plan:   Informed Consent: I have reviewed the patients History and Physical, chart, labs and discussed the procedure including the risks, benefits and alternatives for the proposed anesthesia with the patient or authorized representative who has indicated his/her understanding and acceptance.     Dental advisory given  Plan Discussed with: Anesthesiologist and CRNA  Anesthesia Plan Comments:        Anesthesia Quick Evaluation

## 2023-05-25 NOTE — Anesthesia Procedure Notes (Signed)
Procedure Name: MAC Date/Time: 05/25/2023 12:09 PM  Performed by: Demetrio Lapping, CRNAPre-anesthesia Checklist: Patient identified, Emergency Drugs available, Suction available, Patient being monitored and Timeout performed Patient Re-evaluated:Patient Re-evaluated prior to induction Oxygen Delivery Method: Simple face mask Placement Confirmation: positive ETCO2 Dental Injury: Teeth and Oropharynx as per pre-operative assessment

## 2023-05-25 NOTE — Discharge Instructions (Addendum)
Postoperative instructions:  Weightbearing instructions: don't lift more than 10 lbs x 4 weeks  Keep your dressing and/or splint clean and dry at all times.  You can remove your dressing on post-operative day #3 and change with a dry/sterile dressing or Band-Aids as needed thereafter.    Incision instructions:  Do not soak your incision for 3 weeks after surgery.  If the incision gets wet, pat dry and do not scrub the incision.  Pain control:  You have been given a prescription to be taken as directed for post-operative pain control.  In addition, elevate the operative extremity above the heart at all times to prevent swelling and throbbing pain.  Take over-the-counter Colace, 100mg  by mouth twice a day while taking narcotic pain medications to help prevent constipation.  Follow up appointments: 1) 9-10 days for suture removal and wound check. 2) Dr. Roda Shutters as scheduled.   -------------------------------------------------------------------------------------------------------------  After Surgery Pain Control:  After your surgery, post-surgical discomfort or pain is likely. This discomfort can last several days to a few weeks. At certain times of the day your discomfort may be more intense.  Did you receive a nerve block?  A nerve block can provide pain relief for one hour to two days after your surgery. As long as the nerve block is working, you will experience little or no sensation in the area the surgeon operated on.  As the nerve block wears off, you will begin to experience pain or discomfort. It is very important that you begin taking your prescribed pain medication before the nerve block fully wears off. Treating your pain at the first sign of the block wearing off will ensure your pain is better controlled and more tolerable when full-sensation returns. Do not wait until the pain is intolerable, as the medicine will be less effective. It is better to treat pain in advance than to try  and catch up.  General Anesthesia:  If you did not receive a nerve block during your surgery, you will need to start taking your pain medication shortly after your surgery and should continue to do so as prescribed by your surgeon.  Pain Medication:  Most commonly we prescribe Vicodin and Percocet for post-operative pain. Both of these medications contain a combination of acetaminophen (Tylenol) and a narcotic to help control pain.   It takes between 30 and 45 minutes before pain medication starts to work. It is important to take your medication before your pain level gets too intense.   Nausea is a common side effect of many pain medications. You will want to eat something before taking your pain medicine to help prevent nausea.   If you are taking a prescription pain medication that contains acetaminophen, we recommend that you do not take additional over the counter acetaminophen (Tylenol).  Other pain relieving options:   Using a cold pack to ice the affected area a few times a day (15 to 20 minutes at a time) can help to relieve pain, reduce swelling and bruising.   Elevation of the affected area can also help to reduce pain and swelling.  Post Anesthesia Home Care Instructions  Activity: Get plenty of rest for the remainder of the day. A responsible individual must stay with you for 24 hours following the procedure.  For the next 24 hours, DO NOT: -Drive a car -Advertising copywriter -Drink alcoholic beverages -Take any medication unless instructed by your physician -Make any legal decisions or sign important papers.  Meals: Start with liquid  foods such as gelatin or soup. Progress to regular foods as tolerated. Avoid greasy, spicy, heavy foods. If nausea and/or vomiting occur, drink only clear liquids until the nausea and/or vomiting subsides. Call your physician if vomiting continues.  Special Instructions/Symptoms: Your throat may feel dry or sore from the anesthesia or the  breathing tube placed in your throat during surgery. If this causes discomfort, gargle with warm salt water. The discomfort should disappear within 24 hours.

## 2023-05-26 ENCOUNTER — Encounter (HOSPITAL_BASED_OUTPATIENT_CLINIC_OR_DEPARTMENT_OTHER): Payer: Self-pay | Admitting: Orthopaedic Surgery

## 2023-06-01 ENCOUNTER — Encounter: Payer: Self-pay | Admitting: Orthopaedic Surgery

## 2023-06-01 ENCOUNTER — Ambulatory Visit (INDEPENDENT_AMBULATORY_CARE_PROVIDER_SITE_OTHER): Payer: Medicare Other | Admitting: Physician Assistant

## 2023-06-01 DIAGNOSIS — M65332 Trigger finger, left middle finger: Secondary | ICD-10-CM

## 2023-06-01 NOTE — Progress Notes (Signed)
Post-Op Visit Note   Patient: Anne Terrell           Date of Birth: Aug 01, 1941           MRN: [id number] Visit Date: 06/01/2023 PCP: Pollyann Samples, MD   Assessment & Plan:  Chief Complaint:  Chief Complaint  Patient presents with   Left Hand - Follow-up    Left middle trigger finger release 05/25/2023   Visit Diagnoses:  1. Trigger finger, left middle finger     Plan: Patient is a pleasant 82 year old female who comes in today 1 week status post left long trigger finger release 05/25/2023.  She has been doing well.  She has mild discomfort and swelling to the long finger.  She denies any triggering.  Examination of the left hand reveals a well-healing surgical incision with nylon sutures in place.  No evidence of infection or cellulitis.  Fingers warm well-perfused.  She is neurovascular intact distally.  Today, we cleaned and recovered her wound.  No heavy lifting or submerging her hand underwater.  Follow-up next week for suture removal.  Call with concerns or questions.  Follow-Up Instructions: Return in about 1 week (around 06/08/2023).   Orders:  No orders of the defined types were placed in this encounter.  No orders of the defined types were placed in this encounter.   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   Varicose veins of lower extremities with other complications 05/03/2014   Major depressive disorder, recurrent episode, unspecified 04/11/2013   Major depressive disorder, single episode, moderate (HCC) 01/05/2013   Major  depressive disorder, recurrent episode, severe, without mention of psychotic behavior 11/15/2012   Past Medical History:  Diagnosis Date   Arthralgia    Carpal tunnel syndrome    DDD (degenerative disc disease), cervical    DDD (degenerative disc disease), lumbar    Full dentures    History of hiatal hernia    Hypertension    Myalgia    Myofascial pain    Narcotic dependence (HCC)    Osteoarthritis    Primary localized osteoarthritis of right hip    Right rotator cuff tear    Spondylosis    Varicose veins    Wears glasses     Family History  Problem Relation Age of Onset   Diabetes Mother    Varicose Veins Mother    Asthma Mother    Heart defect Sister     Past Surgical History:  Procedure Laterality Date   ABDOMINAL HYSTERECTOMY  1988   APPENDECTOMY     CARPAL TUNNEL RELEASE Bilateral    COLONOSCOPY WITH ESOPHAGOGASTRODUODENOSCOPY (EGD)     dental posts     holds bottom dentures in place   DILATION AND CURETTAGE OF UTERUS     FRACTURE SURGERY Left    LLE   MULTIPLE TOOTH EXTRACTIONS     SPINAL CORD STIMULATOR INSERTION     located near the right hip and waist area   SPINAL CORD STIMULATOR REMOVAL N/A 09/21/2019   Procedure: LUMBAR SPINAL CORD STIMULATOR REMOVAL;  Surgeon: Ollen Bowl,  Renae Fickle, MD;  Location: [facility] OR;  Service: Neurosurgery;  Laterality: N/A;  LUMBAR SPINAL CORD STIMULATOR REMOVAL   TONSILLECTOMY     TOTAL HIP ARTHROPLASTY Right 03/20/2018   Procedure: RIGHT TOTAL HIP ARTHROPLASTY ANTERIOR APPROACH;  Surgeon: Tarry Kos, MD;  Location: [facility];  Service: Orthopedics;  Laterality: Right;   TRIGGER FINGER RELEASE Left 05/25/2023   Procedure: LEFT MIDDLE TRIGGER FINGER RELEASE;  Surgeon: Tarry Kos, MD;  Location: [facility];  Service: Orthopedics;  Laterality: Left;   Social History   Occupational History   Not on file  Tobacco Use   Smoking status: Former    Current packs/day: 0.00    Average packs/day: 1 pack/day for 30.0 years (30.0  ttl pk-yrs)    Types: Cigarettes    Start date: 05/03/1974    Quit date: 05/03/2004    Years since quitting: 19.0   Smokeless tobacco: Never  Vaping Use   Vaping status: Never Used  Substance and Sexual Activity   Alcohol use: No    Alcohol/week: 0.0 standard drinks of alcohol   Drug use: No   Sexual activity: Not Currently

## 2023-06-08 ENCOUNTER — Ambulatory Visit (INDEPENDENT_AMBULATORY_CARE_PROVIDER_SITE_OTHER): Payer: Medicare Other | Admitting: Physician Assistant

## 2023-06-08 ENCOUNTER — Encounter: Payer: Self-pay | Admitting: Orthopaedic Surgery

## 2023-06-08 DIAGNOSIS — M65332 Trigger finger, left middle finger: Secondary | ICD-10-CM

## 2023-06-08 NOTE — Progress Notes (Signed)
Post-Op Visit Note   Patient: Anne Terrell           Date of Birth: 31-Oct-1940           MRN: [id number] Visit Date: 06/08/2023 PCP: Pollyann Samples, MD   Assessment & Plan:  Chief Complaint:  Chief Complaint  Patient presents with   Left Hand - Follow-up    Middle trigger finger release 05/25/2023   Visit Diagnoses:  1. Trigger finger, left middle finger     Plan: Patient is a pleasant 82 year old female who comes in today 2 weeks status post left long trigger finger release 05/25/2023.  She has been doing well.  She does note some slight discomfort.  She tells me she had 1 suture that partially came out on its own last week.  Examination of the left hand reveals a well-healed surgical incision.  There is 1 nylon mattress suture in place.  The other suture appears to be just 1 piece of nylon poking through the skin.  No signs of infection or cellulitis.  She is neurovascular intact distally.  Today, the remaining sutures were removed.  Steri-Strips applied.  No heavy lifting or submerging her hand underwater for 2 more weeks.  Follow-up in 4 weeks for recheck as she will be in [place] 2 weeks from now.  Call with concerns or questions in the meantime.  Follow-Up Instructions: Return in about 4 weeks (around 07/06/2023).   Orders:  No orders of the defined types were placed in this encounter.  No orders of the defined types were placed in this encounter.   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   Varicose veins of bilateral lower extremities with  other complications 05/03/2014   Major depressive disorder, recurrent episode (HCC) 04/11/2013   Major depressive disorder, single episode, moderate (HCC) 01/05/2013   Severe episode of recurrent major depressive disorder (HCC) 11/15/2012   Past Medical History:  Diagnosis Date   Arthralgia    Carpal tunnel syndrome    DDD (degenerative disc disease), cervical    DDD (degenerative disc disease), lumbar    Full dentures    History of hiatal hernia    Hypertension    Myalgia    Myofascial pain    Narcotic dependence (HCC)    Osteoarthritis    Primary localized osteoarthritis of right hip    Right rotator cuff tear    Spondylosis    Varicose veins    Wears glasses     Family History  Problem Relation Age of Onset   Diabetes Mother    Varicose Veins Mother    Asthma Mother    Heart defect Sister     Past Surgical History:  Procedure Laterality Date   ABDOMINAL HYSTERECTOMY  1988   APPENDECTOMY     CARPAL TUNNEL RELEASE Bilateral    COLONOSCOPY WITH ESOPHAGOGASTRODUODENOSCOPY (EGD)     dental posts     holds bottom dentures in place   DILATION AND CURETTAGE OF UTERUS     FRACTURE SURGERY Left    LLE   MULTIPLE TOOTH EXTRACTIONS  SPINAL CORD STIMULATOR INSERTION     located near the right hip and waist area   SPINAL CORD STIMULATOR REMOVAL N/A 09/21/2019   Procedure: LUMBAR SPINAL CORD STIMULATOR REMOVAL;  Surgeon: Odette Fraction, MD;  Location: [facility] OR;  Service: Neurosurgery;  Laterality: N/A;  LUMBAR SPINAL CORD STIMULATOR REMOVAL   TONSILLECTOMY     TOTAL HIP ARTHROPLASTY Right 03/20/2018   Procedure: RIGHT TOTAL HIP ARTHROPLASTY ANTERIOR APPROACH;  Surgeon: Tarry Kos, MD;  Location: [facility];  Service: Orthopedics;  Laterality: Right;   TRIGGER FINGER RELEASE Left 05/25/2023   Procedure: LEFT MIDDLE TRIGGER FINGER RELEASE;  Surgeon: Tarry Kos, MD;  Location: [facility];  Service: Orthopedics;  Laterality: Left;   Social History   Occupational  History   Not on file  Tobacco Use   Smoking status: Former    Current packs/day: 0.00    Average packs/day: 1 pack/day for 30.0 years (30.0 ttl pk-yrs)    Types: Cigarettes    Start date: 05/03/1974    Quit date: 05/03/2004    Years since quitting: 19.1   Smokeless tobacco: Never  Vaping Use   Vaping status: Never Used  Substance and Sexual Activity   Alcohol use: No    Alcohol/week: 0.0 standard drinks of alcohol   Drug use: No   Sexual activity: Not Currently

## 2023-07-06 ENCOUNTER — Encounter: Payer: Medicare Other | Admitting: Orthopaedic Surgery

## 2023-07-12 ENCOUNTER — Ambulatory Visit (INDEPENDENT_AMBULATORY_CARE_PROVIDER_SITE_OTHER): Payer: Medicare Other | Admitting: Orthopaedic Surgery

## 2023-07-12 ENCOUNTER — Encounter: Payer: Self-pay | Admitting: Orthopaedic Surgery

## 2023-07-12 DIAGNOSIS — M65332 Trigger finger, left middle finger: Secondary | ICD-10-CM

## 2023-07-12 NOTE — Progress Notes (Signed)
Post-Op Visit Note   Patient: Anne Terrell           Date of Birth: Oct 27, 1940           MRN: [id number] Visit Date: 07/12/2023 PCP: Pollyann Samples, MD   Assessment & Plan:  Chief Complaint:  Chief Complaint  Patient presents with   Left Hand - Follow-up    Left middle trigger finger release 05/25/2023   Visit Diagnoses:  1. Trigger finger, left middle finger     Plan: [name] is 7 weeks status post left middle trigger finger release.  Overall doing well.  Reports occasional burning and discomfort around the surgical area.  Exam of the left hand shows a fully healed surgical scar.  A little bit of reddish discoloration of the skin consistent with postsurgical changes.  She is almost able to make a full composite fist.  [name] is doing well from my standpoint.  She has no restrictions.  Activity as tolerated.  Follow-up as needed.  Follow-Up Instructions: No follow-ups on file.   Orders:  No orders of the defined types were placed in this encounter.  No orders of the defined types were placed in this encounter.   Imaging: No results found.  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   Varicose veins of bilateral lower extremities with other complications 05/03/2014   Major depressive disorder, recurrent episode (HCC) 04/11/2013   Major depressive disorder, single episode, moderate (HCC) 01/05/2013   Severe episode of recurrent major depressive disorder (HCC) 11/15/2012   Past Medical History:  Diagnosis Date   Arthralgia    Carpal tunnel syndrome    DDD  (degenerative disc disease), cervical    DDD (degenerative disc disease), lumbar    Full dentures    History of hiatal hernia    Hypertension    Myalgia    Myofascial pain    Narcotic dependence (HCC)    Osteoarthritis    Primary localized osteoarthritis of right hip    Right rotator cuff tear    Spondylosis    Varicose veins    Wears glasses     Family History  Problem Relation Age of Onset   Diabetes Mother    Varicose Veins Mother    Asthma Mother    Heart defect Sister     Past Surgical History:  Procedure Laterality Date   ABDOMINAL HYSTERECTOMY  1988   APPENDECTOMY     CARPAL TUNNEL RELEASE Bilateral    COLONOSCOPY WITH ESOPHAGOGASTRODUODENOSCOPY (EGD)     dental posts     holds bottom dentures in place   DILATION AND CURETTAGE OF UTERUS     FRACTURE SURGERY Left    LLE   MULTIPLE TOOTH EXTRACTIONS     SPINAL CORD STIMULATOR INSERTION     located near the right hip and waist area   SPINAL CORD STIMULATOR REMOVAL N/A 09/21/2019   Procedure: LUMBAR SPINAL CORD STIMULATOR REMOVAL;  Surgeon: Odette Fraction, MD;  Location: [facility] OR;  Service: Neurosurgery;  Laterality: N/A;  LUMBAR SPINAL CORD STIMULATOR REMOVAL   TONSILLECTOMY     TOTAL HIP  ARTHROPLASTY Right 03/20/2018   Procedure: RIGHT TOTAL HIP ARTHROPLASTY ANTERIOR APPROACH;  Surgeon: Tarry Kos, MD;  Location: [facility];  Service: Orthopedics;  Laterality: Right;   TRIGGER FINGER RELEASE Left 05/25/2023   Procedure: LEFT MIDDLE TRIGGER FINGER RELEASE;  Surgeon: Tarry Kos, MD;  Location: [facility];  Service: Orthopedics;  Laterality: Left;   Social History   Occupational History   Not on file  Tobacco Use   Smoking status: Former    Current packs/day: 0.00    Average packs/day: 1 pack/day for 30.0 years (30.0 ttl pk-yrs)    Types: Cigarettes    Start date: 05/03/1974    Quit date: 05/03/2004    Years since quitting: 19.2   Smokeless tobacco: Never  Vaping Use   Vaping status: Never Used   Substance and Sexual Activity   Alcohol use: No    Alcohol/week: 0.0 standard drinks of alcohol   Drug use: No   Sexual activity: Not Currently

## 2023-08-06 ENCOUNTER — Other Ambulatory Visit (HOSPITAL_COMMUNITY): Payer: Self-pay

## 2023-08-10 ENCOUNTER — Other Ambulatory Visit (HOSPITAL_COMMUNITY): Payer: Self-pay

## 2023-08-10 ENCOUNTER — Other Ambulatory Visit: Payer: Medicare Other

## 2023-08-10 MED ORDER — WEGOVY 1 MG/0.5ML ~~LOC~~ SOAJ
SUBCUTANEOUS | 0 refills | Status: DC
  Filled 2023-08-10: qty 2, 28d supply, fill #0

## 2023-08-17 ENCOUNTER — Other Ambulatory Visit (HOSPITAL_COMMUNITY): Payer: Self-pay

## 2023-09-02 ENCOUNTER — Other Ambulatory Visit (HOSPITAL_COMMUNITY): Payer: Self-pay

## 2023-10-24 ENCOUNTER — Other Ambulatory Visit (HOSPITAL_COMMUNITY): Payer: Self-pay

## 2023-11-11 ENCOUNTER — Other Ambulatory Visit (HOSPITAL_COMMUNITY): Payer: Self-pay

## 2023-11-11 MED ORDER — WEGOVY 0.25 MG/0.5ML ~~LOC~~ SOAJ
0.2500 mg | SUBCUTANEOUS | 0 refills | Status: DC
Start: 2023-11-01 — End: 2024-01-09
  Filled 2023-11-11: qty 2, 28d supply, fill #0

## 2023-11-18 ENCOUNTER — Other Ambulatory Visit (HOSPITAL_COMMUNITY): Payer: Self-pay

## 2023-12-13 ENCOUNTER — Other Ambulatory Visit: Payer: Self-pay

## 2023-12-13 ENCOUNTER — Other Ambulatory Visit (HOSPITAL_COMMUNITY): Payer: Self-pay

## 2023-12-13 MED ORDER — WEGOVY 0.5 MG/0.5ML ~~LOC~~ SOAJ
0.5000 mg | SUBCUTANEOUS | 0 refills | Status: DC
  Filled 2023-12-13: qty 2, 28d supply, fill #0

## 2023-12-13 MED ORDER — WEGOVY 0.5 MG/0.5ML ~~LOC~~ SOAJ
0.5000 mL | SUBCUTANEOUS | 0 refills | Status: DC
Start: 2023-12-13 — End: 2024-03-07
  Filled 2023-12-13: qty 2, 28d supply, fill #0

## 2023-12-14 ENCOUNTER — Other Ambulatory Visit (HOSPITAL_COMMUNITY): Payer: Self-pay

## 2023-12-15 ENCOUNTER — Other Ambulatory Visit (HOSPITAL_COMMUNITY): Payer: Self-pay

## 2023-12-24 ENCOUNTER — Other Ambulatory Visit (HOSPITAL_COMMUNITY): Payer: Self-pay

## 2024-01-09 ENCOUNTER — Emergency Department (HOSPITAL_COMMUNITY)

## 2024-01-09 ENCOUNTER — Encounter (HOSPITAL_COMMUNITY): Payer: Self-pay | Admitting: Emergency Medicine

## 2024-01-09 ENCOUNTER — Inpatient Hospital Stay (HOSPITAL_COMMUNITY)
Admission: EM | Admit: 2024-01-09 | Discharge: 2024-01-14 | DRG: 394 | Disposition: A | Discharge status: Home / Self Care | Attending: Internal Medicine | Admitting: Internal Medicine

## 2024-01-09 ENCOUNTER — Other Ambulatory Visit: Payer: Self-pay

## 2024-01-09 DIAGNOSIS — K633 Ulcer of intestine: Secondary | ICD-10-CM | POA: Diagnosis present

## 2024-01-09 DIAGNOSIS — Z886 Allergy status to analgesic agent status: Secondary | ICD-10-CM

## 2024-01-09 DIAGNOSIS — Z96641 Presence of right artificial hip joint: Secondary | ICD-10-CM | POA: Diagnosis present

## 2024-01-09 DIAGNOSIS — K559 Vascular disorder of intestine, unspecified: Secondary | ICD-10-CM | POA: Diagnosis not present

## 2024-01-09 DIAGNOSIS — K2950 Unspecified chronic gastritis without bleeding: Secondary | ICD-10-CM | POA: Diagnosis present

## 2024-01-09 DIAGNOSIS — R1084 Generalized abdominal pain: Secondary | ICD-10-CM | POA: Diagnosis not present

## 2024-01-09 DIAGNOSIS — E669 Obesity, unspecified: Secondary | ICD-10-CM | POA: Diagnosis present

## 2024-01-09 DIAGNOSIS — R197 Diarrhea, unspecified: Secondary | ICD-10-CM

## 2024-01-09 DIAGNOSIS — Z833 Family history of diabetes mellitus: Secondary | ICD-10-CM

## 2024-01-09 DIAGNOSIS — Z6830 Body mass index (BMI) 30.0-30.9, adult: Secondary | ICD-10-CM

## 2024-01-09 DIAGNOSIS — Z880 Allergy status to penicillin: Secondary | ICD-10-CM

## 2024-01-09 DIAGNOSIS — K529 Noninfective gastroenteritis and colitis, unspecified: Principal | ICD-10-CM | POA: Diagnosis present

## 2024-01-09 DIAGNOSIS — I8390 Asymptomatic varicose veins of unspecified lower extremity: Secondary | ICD-10-CM | POA: Diagnosis present

## 2024-01-09 DIAGNOSIS — Z91013 Allergy to seafood: Secondary | ICD-10-CM

## 2024-01-09 DIAGNOSIS — I700 Atherosclerosis of aorta: Secondary | ICD-10-CM | POA: Diagnosis present

## 2024-01-09 DIAGNOSIS — K648 Other hemorrhoids: Secondary | ICD-10-CM | POA: Diagnosis present

## 2024-01-09 DIAGNOSIS — G8929 Other chronic pain: Secondary | ICD-10-CM | POA: Diagnosis present

## 2024-01-09 DIAGNOSIS — M792 Neuralgia and neuritis, unspecified: Secondary | ICD-10-CM | POA: Diagnosis present

## 2024-01-09 DIAGNOSIS — R112 Nausea with vomiting, unspecified: Secondary | ICD-10-CM

## 2024-01-09 DIAGNOSIS — Z66 Do not resuscitate: Secondary | ICD-10-CM | POA: Diagnosis present

## 2024-01-09 DIAGNOSIS — Z7985 Long-term (current) use of injectable non-insulin antidiabetic drugs: Secondary | ICD-10-CM

## 2024-01-09 DIAGNOSIS — K3189 Other diseases of stomach and duodenum: Secondary | ICD-10-CM | POA: Diagnosis present

## 2024-01-09 DIAGNOSIS — K6389 Other specified diseases of intestine: Secondary | ICD-10-CM | POA: Diagnosis present

## 2024-01-09 DIAGNOSIS — M549 Dorsalgia, unspecified: Secondary | ICD-10-CM | POA: Diagnosis present

## 2024-01-09 DIAGNOSIS — Z90710 Acquired absence of both cervix and uterus: Secondary | ICD-10-CM

## 2024-01-09 DIAGNOSIS — K644 Residual hemorrhoidal skin tags: Secondary | ICD-10-CM | POA: Diagnosis present

## 2024-01-09 DIAGNOSIS — Z79899 Other long term (current) drug therapy: Secondary | ICD-10-CM

## 2024-01-09 DIAGNOSIS — K922 Gastrointestinal hemorrhage, unspecified: Secondary | ICD-10-CM

## 2024-01-09 DIAGNOSIS — E785 Hyperlipidemia, unspecified: Secondary | ICD-10-CM | POA: Diagnosis present

## 2024-01-09 DIAGNOSIS — I129 Hypertensive chronic kidney disease with stage 1 through stage 4 chronic kidney disease, or unspecified chronic kidney disease: Secondary | ICD-10-CM | POA: Diagnosis present

## 2024-01-09 DIAGNOSIS — N1831 Chronic kidney disease, stage 3a: Secondary | ICD-10-CM | POA: Diagnosis present

## 2024-01-09 DIAGNOSIS — Z87891 Personal history of nicotine dependence: Secondary | ICD-10-CM

## 2024-01-09 DIAGNOSIS — M1611 Unilateral primary osteoarthritis, right hip: Secondary | ICD-10-CM | POA: Diagnosis present

## 2024-01-09 DIAGNOSIS — Z79891 Long term (current) use of opiate analgesic: Secondary | ICD-10-CM

## 2024-01-09 DIAGNOSIS — R7989 Other specified abnormal findings of blood chemistry: Secondary | ICD-10-CM | POA: Diagnosis present

## 2024-01-09 LAB — COMPREHENSIVE METABOLIC PANEL WITH GFR
ALT: 17 U/L (ref 0–44)
AST: 23 U/L (ref 15–41)
Albumin: 4.1 g/dL (ref 3.5–5.0)
Alkaline Phosphatase: 60 U/L (ref 38–126)
Anion gap: 12 (ref 5–15)
BUN: 22 mg/dL (ref 8–23)
CO2: 25 mmol/L (ref 22–32)
Calcium: 9.3 mg/dL (ref 8.9–10.3)
Chloride: 104 mmol/L (ref 98–111)
Creatinine, Ser: 1.2 mg/dL — ABNORMAL HIGH (ref 0.44–1.00)
GFR, Estimated: 45 mL/min — ABNORMAL LOW (ref >60)
Glucose, Bld: 121 mg/dL — ABNORMAL HIGH (ref 70–99)
Potassium: 4.9 mmol/L (ref 3.5–5.1)
Sodium: 141 mmol/L (ref 135–145)
Total Bilirubin: 0.7 mg/dL (ref 0.0–1.2)
Total Protein: 7 g/dL (ref 6.5–8.1)

## 2024-01-09 LAB — URINALYSIS, ROUTINE W REFLEX MICROSCOPIC
Bacteria, UA: NONE SEEN
Bilirubin Urine: NEGATIVE
Glucose, UA: NEGATIVE mg/dL
Ketones, ur: NEGATIVE mg/dL
Leukocytes,Ua: NEGATIVE
Nitrite: NEGATIVE
Protein, ur: NEGATIVE mg/dL
Specific Gravity, Urine: >1.046 — ABNORMAL HIGH (ref 1.005–1.030)
pH: 5 (ref 5.0–8.0)

## 2024-01-09 LAB — CBC WITH DIFFERENTIAL/PLATELET
Abs Immature Granulocytes: 0.05 10*3/uL (ref 0.00–0.07)
Basophils Absolute: 0 10*3/uL (ref 0.0–0.1)
Basophils Relative: 0 %
Eosinophils Absolute: 0 10*3/uL (ref 0.0–0.5)
Eosinophils Relative: 0 %
HCT: 43.9 % (ref 36.0–46.0)
Hemoglobin: 13.5 g/dL (ref 12.0–15.0)
Immature Granulocytes: 0 %
Lymphocytes Relative: 6 %
Lymphs Abs: 0.7 10*3/uL (ref 0.7–4.0)
MCH: 28.8 pg (ref 26.0–34.0)
MCHC: 30.8 g/dL (ref 30.0–36.0)
MCV: 93.8 fL (ref 80.0–100.0)
Monocytes Absolute: 0.6 10*3/uL (ref 0.1–1.0)
Monocytes Relative: 5 %
Neutro Abs: 11.6 10*3/uL — ABNORMAL HIGH (ref 1.7–7.7)
Neutrophils Relative %: 89 %
Platelets: 258 10*3/uL (ref 150–400)
RBC: 4.68 MIL/uL (ref 3.87–5.11)
RDW: 12.2 % (ref 11.5–15.5)
WBC: 13.1 10*3/uL — ABNORMAL HIGH (ref 4.0–10.5)
nRBC: 0 % (ref 0.0–0.2)

## 2024-01-09 LAB — TYPE AND SCREEN
ABO/RH(D): A POS
Antibody Screen: NEGATIVE

## 2024-01-09 LAB — C-REACTIVE PROTEIN: CRP: 0.8 mg/dL (ref <1.0)

## 2024-01-09 LAB — SEDIMENTATION RATE: Sed Rate: 31 mm/h — ABNORMAL HIGH (ref 0–22)

## 2024-01-09 LAB — LIPASE, BLOOD: Lipase: 33 U/L (ref 11–51)

## 2024-01-09 LAB — OCCULT BLOOD X 1 CARD TO LAB, STOOL: Fecal Occult Bld: POSITIVE — AB

## 2024-01-09 MED ORDER — LACTATED RINGERS IV BOLUS
1000.0000 mL | Freq: Once | INTRAVENOUS | Status: AC
  Administered 2024-01-09: 1000 mL via INTRAVENOUS

## 2024-01-09 MED ORDER — MORPHINE SULFATE (PF) 4 MG/ML IV SOLN
4.0000 mg | Freq: Once | INTRAVENOUS | Status: AC
  Administered 2024-01-09: 4 mg via INTRAVENOUS
  Filled 2024-01-09: qty 1

## 2024-01-09 MED ORDER — ROSUVASTATIN CALCIUM 5 MG PO TABS
10.0000 mg | ORAL_TABLET | Freq: Every day | ORAL | Status: DC
  Administered 2024-01-09 – 2024-01-14 (×6): 10 mg via ORAL
  Filled 2024-01-09 (×6): qty 2

## 2024-01-09 MED ORDER — IOHEXOL 350 MG/ML SOLN
75.0000 mL | Freq: Once | INTRAVENOUS | Status: AC | PRN
  Administered 2024-01-09: 75 mL via INTRAVENOUS

## 2024-01-09 MED ORDER — ONDANSETRON 4 MG PO TBDP
8.0000 mg | ORAL_TABLET | Freq: Three times a day (TID) | ORAL | Status: DC | PRN
  Administered 2024-01-11: 8 mg via ORAL
  Filled 2024-01-09: qty 2

## 2024-01-09 MED ORDER — PANTOPRAZOLE SODIUM 40 MG IV SOLR
40.0000 mg | INTRAVENOUS | Status: AC
  Administered 2024-01-09 (×2): 40 mg via INTRAVENOUS
  Filled 2024-01-09: qty 10

## 2024-01-09 MED ORDER — ACETAMINOPHEN 650 MG RE SUPP: 650.0000 mg | Freq: Four times a day (QID) | RECTAL | Status: DC | PRN

## 2024-01-09 MED ORDER — SENNOSIDES-DOCUSATE SODIUM 8.6-50 MG PO TABS: 1.0000 | ORAL_TABLET | Freq: Every evening | ORAL | Status: DC | PRN

## 2024-01-09 MED ORDER — PANTOPRAZOLE SODIUM 40 MG IV SOLR: 40.0000 mg | Freq: Two times a day (BID) | INTRAVENOUS | Status: DC

## 2024-01-09 MED ORDER — GABAPENTIN 400 MG PO CAPS
400.0000 mg | ORAL_CAPSULE | Freq: Four times a day (QID) | ORAL | Status: DC
  Administered 2024-01-09 – 2024-01-14 (×18): 400 mg via ORAL
  Filled 2024-01-09 (×19): qty 1

## 2024-01-09 MED ORDER — MORPHINE SULFATE 15 MG PO TABS
15.0000 mg | ORAL_TABLET | Freq: Four times a day (QID) | ORAL | Status: DC
  Administered 2024-01-09 – 2024-01-14 (×18): 15 mg via ORAL
  Filled 2024-01-09 (×19): qty 1

## 2024-01-09 MED ORDER — ACETAMINOPHEN 325 MG PO TABS
650.0000 mg | ORAL_TABLET | Freq: Four times a day (QID) | ORAL | Status: DC | PRN
  Administered 2024-01-09: 650 mg via ORAL
  Filled 2024-01-09: qty 2

## 2024-01-09 MED ORDER — MELATONIN 3 MG PO TABS: 3.0000 mg | ORAL_TABLET | Freq: Every evening | ORAL | Status: DC | PRN

## 2024-01-09 MED ORDER — SODIUM CHLORIDE 0.9 % IV SOLN
2.0000 g | Freq: Once | INTRAVENOUS | Status: DC
  Administered 2024-01-09: 2 g via INTRAVENOUS
  Filled 2024-01-09: qty 20

## 2024-01-09 MED ORDER — METOCLOPRAMIDE HCL 5 MG/ML IJ SOLN
10.0000 mg | Freq: Once | INTRAMUSCULAR | Status: AC
  Administered 2024-01-09: 10 mg via INTRAVENOUS
  Filled 2024-01-09: qty 2

## 2024-01-09 MED ORDER — METRONIDAZOLE 500 MG/100ML IV SOLN
500.0000 mg | Freq: Two times a day (BID) | INTRAVENOUS | Status: DC
  Administered 2024-01-09: 500 mg via INTRAVENOUS
  Filled 2024-01-09: qty 100

## 2024-01-09 NOTE — H&P (Cosign Needed Addendum)
 Date: 01/09/2024               Patient Name:  Anne Terrell MRN: [id number]  DOB: 1941-04-03 Age / Sex: 83 y.o., female   PCP: Beam, Conway Dennis, MD         Medical Service: Internal Medicine Teaching Service         Attending Physician: Dr. Bevelyn Bryant, MD      First Contact: Dr. Ronni Colace, DO Pager [phone]    Second Contact: Dr. Cathey Clunes, MD          After Hours (After 5p/  First Contact Pager: [phone]  weekends / holidays): Second Contact Pager: [phone]   SUBJECTIVE   Chief Complaint: Abdominal pain, nausea/vomiting, diarrhea, melena  History of Present Illness: Anne Terrell is an 83 year old female with past medical history of hypertension, chronic back pain, hypertension, CKD 3A, hyperlipidemia, obesity who presents with abdominal pain, nausea, vomiting, and one episode of melena that began at 6 AM this morning.  No prior history with this. She was in her normal state of health until being awoken by 10/10 abdominal pain that was cramping in nature.  She felt an immediate urge to have a BM and had intermittent watery stool while sitting on the toilet for 30 minutes. She then went back to bed but was awoken with the same symptoms around 11 AM with additional episode of watery stool and also noticed a large dark clot of blood. She also endorsed non-bloody emesis during these episodes. No bowel movements/blood/emesis since then. She is on Wegovy  0.5 mg for weight loss but has been on this dose for one month. Denies fever, chills, recent travel, antibiotic use, cough, SOB, dysuria.   Medications:  Current Outpatient Medications  Medication Instructions   gabapentin  (NEURONTIN ) 400 mg, 4 times daily   lisinopril (ZESTRIL) 10 mg, Oral, Daily   morphine (MSIR) 15 mg, Oral, 4 times daily   ondansetron  (ZOFRAN ) 4 mg, Oral, Every 8 hours PRN   rosuvastatin (CRESTOR) 10 mg, Oral, Daily   Wegovy  0.5 mg, Subcutaneous, Weekly    Past Medical History Past Medical History:   Diagnosis Date   Arthralgia    Carpal tunnel syndrome    DDD (degenerative disc disease), cervical    DDD (degenerative disc disease), lumbar    Full dentures    History of hiatal hernia    Hypertension    Myalgia    Myofascial pain    Narcotic dependence (HCC)    Osteoarthritis    Primary localized osteoarthritis of right hip    Right rotator cuff tear    Spondylosis    Varicose veins    Wears glasses     Past Surgical History Past Surgical History:  Procedure Laterality Date   ABDOMINAL HYSTERECTOMY  1988   APPENDECTOMY     CARPAL TUNNEL RELEASE Bilateral    COLONOSCOPY WITH ESOPHAGOGASTRODUODENOSCOPY (EGD)     dental posts     holds bottom dentures in place   DILATION AND CURETTAGE OF UTERUS     FRACTURE SURGERY Left    LLE   MULTIPLE TOOTH EXTRACTIONS     SPINAL CORD STIMULATOR INSERTION     located near the right hip and waist area   SPINAL CORD STIMULATOR REMOVAL N/A 09/21/2019   Procedure: LUMBAR SPINAL CORD STIMULATOR REMOVAL;  Surgeon: Gerri Kras, MD;  Location: [facility] OR;  Service: Neurosurgery;  Laterality: N/A;  LUMBAR SPINAL CORD STIMULATOR REMOVAL   TONSILLECTOMY     TOTAL HIP  ARTHROPLASTY Right 03/20/2018   Procedure: RIGHT TOTAL HIP ARTHROPLASTY ANTERIOR APPROACH;  Surgeon: Wes Hamman, MD;  Location: [facility] ORS;  Service: Orthopedics;  Laterality: Right;   TRIGGER FINGER RELEASE Left 05/25/2023   Procedure: LEFT MIDDLE TRIGGER FINGER RELEASE;  Surgeon: Wes Hamman, MD;  Location: [facility];  Service: Orthopedics;  Laterality: Left;    Social:  Lives at home alone, good family support Level of Function: Independent PCP: [facility] Substances: Denies any substance use  Family History:  Family History  Problem Relation Age of Onset   Diabetes Mother    Varicose Veins Mother    Asthma Mother    Heart defect Sister     Allergies: Allergies as of 01/09/2024 - Review Complete 01/09/2024  Allergen Reaction Noted   Penicillins Rash  05/03/2014   Shellfish allergy Nausea And Vomiting and Other (See Comments) 05/03/2014   Aspirin Other (See Comments) 01/13/2016    Review of Systems: A complete ROS was negative except as per HPI.   OBJECTIVE:   Physical Exam: Blood pressure 137/67, pulse 93, temperature 98.2 F (36.8 C), temperature source Oral, resp. rate 20, SpO2 98%.   Constitutional: obese, lying in bed, in no acute distress Cardiovascular: regular rate and rhythm, no m/r/g, no LEE, DP pulses 2+ Pulmonary/Chest: normal work of breathing on room air, lungs clear to auscultation bilaterally Abdominal: soft, non-distended, mild TTP in left/right mid abdomen, no overt splenomegaly/hepatomegaly, normoactive bowel sounds, no CVA tenderness Neurological: alert & oriented Skin: warm and dry Psych: normal mood and behavior   Labs: CBC    Component Value Date/Time   WBC 13.1 (H) 01/09/2024 1232   RBC 4.68 01/09/2024 1232   HGB 13.5 01/09/2024 1232   HCT 43.9 01/09/2024 1232   PLT 258 01/09/2024 1232   MCV 93.8 01/09/2024 1232   MCH 28.8 01/09/2024 1232   MCHC 30.8 01/09/2024 1232   RDW 12.2 01/09/2024 1232   LYMPHSABS 0.7 01/09/2024 1232   MONOABS 0.6 01/09/2024 1232   EOSABS 0.0 01/09/2024 1232   BASOSABS 0.0 01/09/2024 1232     CMP     Component Value Date/Time   NA 141 01/09/2024 1232   NA 142 08/08/2017 1410   K 4.9 01/09/2024 1232   CL 104 01/09/2024 1232   CO2 25 01/09/2024 1232   GLUCOSE 121 (H) 01/09/2024 1232   BUN 22 01/09/2024 1232   BUN 23 08/08/2017 1410   CREATININE 1.20 (H) 01/09/2024 1232   CALCIUM 9.3 01/09/2024 1232   PROT 7.0 01/09/2024 1232   ALBUMIN 4.1 01/09/2024 1232   AST 23 01/09/2024 1232   ALT 17 01/09/2024 1232   ALKPHOS 60 01/09/2024 1232   BILITOT 0.7 01/09/2024 1232   GFRNONAA 45 (L) 01/09/2024 1232   GFRAA >60 03/24/2018 0534    Imaging: CTAP  IMPRESSION: 1. There is an approximately 20 cm long segment of left transverse colon exhibiting moderate  circumferential wall thickening with mild to moderate pericolonic fat stranding, compatible with focal colitis. No associated abscess or collection. Colonoscopy is recommended after the acute episode subsides, unless recently performed, to exclude underlying neoplastic process. 2. Multiple other nonacute observations, as described above. 3. Aortic Atherosclerosis  EKG: personally reviewed my interpretation is NSR  ASSESSMENT & PLAN:   Assessment & Plan by Problem: Principal Problem:   Colitis   [place] Niang is a 83 y.o.female with past medical history of hypertension, chronic back pain, hypertension, CKD 3A, hyperlipidemia, obesity who presents with abdominal pain, nausea, vomiting,  and one episode of melena. Admitted for focal colitis.  Focal Colitis As noted on CTAP. GI consulted, appreciate recommendations. Etiology unclear presently. Potentially infectious given neutrophil predominant (mild) leukocytosis though there is a lack of systemic infectious symptoms. Symptoms not associated with meals and normotensive on admission so less likely ischemic colitis. She has a history of hyperlipidemia but LDL has been well controlled recently. No medications that would classically cause drug-induced colitis. Denies recent antibiotic use as well. Given acute nature and age, not likely IBD or microscopic colitis. No diverticula noted on CTAP and she had a colonoscopy last year that she said was completely normal (unable to see record). Given that dose of Wegovy  was increase 4 weeks ago, I do not believe this to be contributory. Will await GI recommendations, but I'm encouraged that she feels better since this morning. She received IV Rocephin and Flagyl  in ED. Will manage symptoms conservatively with IVF's, antiemetics, and clear liquid diet in the meantime. -Follow-up GI recommendations -ESR/CRP -GI stool panel + C. diff -Trend CBC/BMP  Elevated Creatinine CKD 3A Mild.1.20 on admission with  baseline ~ 1.1. Most likely 2/2 to reduced po intake.  -IVF's and encourage po intake -Hold Lisinopril  Chronic Back Pain Neuralgia Continue home Neurontin  and morphine  HTN BP is stable. Holding Lisinopril.  HLD Continue home Crestor  Aortic Atherosclerosis As noted on CTAP. Crestor per above.  Diet:  Clear Liquid VTE: SCDs IVF: LR,Bolus Code: DNR/DNI  Dispo: Admit patient to Observation with expected length of stay less than 2 midnights.  Signed: Ronni Colace, DO Internal Medicine Resident PGY-1 01/09/2024, 4:50 PM

## 2024-01-09 NOTE — Plan of Care (Signed)

## 2024-01-09 NOTE — ED Provider Triage Note (Signed)
 Emergency Medicine Provider Triage Evaluation Note  Anne Terrell , a 83 y.o. female  was evaluated in triage.  Pt complains of abdominal pain that began at 6:30 AM.  Patient states that she was having nausea and vomiting and had standing toilet for an extended amount of time in which she had dark red stool at 1 point with red strings.  Patient denies history of stomach ulcers or GI bleeds.  Patient is not on any blood thinners.  Patient states that she recently started to go up 1 and thinks this may have caused it.  Patient states she is also on morphine every 6 hours as well and has not had her daily dose.  Patient denies chest pain or shortness of breath but states she has generalized abdominal pain.  Denies sick contacts.  Review of Systems  Positive:  Negative:   Physical Exam  BP 137/67 (BP Location: Left Arm)   Pulse 93   Temp 98.2 F (36.8 C) (Oral)   Resp 20   SpO2 98%  Gen:   Awake, appears uncomfortable Resp:  Normal effort  MSK:   Moves extremities without difficulty  Other:  Generalized abdominal tenderness without peritoneal signs  Medical Decision Making  Medically screening exam initiated at 12:33 PM.  Appropriate orders placed.  Anne Terrell was informed that the remainder of the evaluation will be completed by another provider, this initial triage assessment does not replace that evaluation, and the importance of remaining in the ED until their evaluation is complete.  Workup initiated, and nursing staff notified that patient should not go to the lobby.   Denese Finn, PA-C 01/09/24 1234

## 2024-01-09 NOTE — ED Triage Notes (Signed)
 Pt BIB by EMS for generalized ABD pain. Endorses weakness and dark-red blood in stool. Takes Wegovy  weekly. Denies hx of GI bleeding or needing prior blood transfusions.   EMS VS:  BP 150/92 HR 90 92% RA

## 2024-01-09 NOTE — ED Provider Notes (Signed)
 [place] EMERGENCY DEPARTMENT AT [facility] Provider Note   CSN: [id number] Arrival date & time: 01/09/24  1216     History  Chief Complaint  Patient presents with   Abdominal Pain    Anne Terrell is a 83 y.o. female.  83 year old female with a history of chronic pain, hiatal hernia, and obesity on Wegovy  presents emergency department nausea and vomiting and diarrhea.  Patient reports that this morning she started experiencing bouts of nausea and vomiting and diarrhea.  Also has been having severe abdominal cramping in the sides of her abdomen.  Emesis is nonbloody nonbilious.  Has noticed bright red blood in her stool.  Not on blood thinners.  Has not had a reaction to Wegovy  like this.  Only abdominal surgery is a hysterectomy.  Says that she had a colonoscopy in [place] a year ago that she believes was normal.       Home Medications Prior to Admission medications   Medication Sig Start Date End Date Taking? Authorizing Provider  gabapentin  (NEURONTIN ) 400 MG capsule Take 400 mg by mouth 4 (four) times daily.   Yes [provider]  lisinopril (ZESTRIL) 10 MG tablet Take 10 mg by mouth daily.   Yes [provider]  morphine (MSIR) 15 MG tablet Take 15 mg by mouth 4 (four) times daily. 12/20/23  Yes [provider]  ondansetron  (ZOFRAN ) 4 MG tablet Take 1 tablet (4 mg total) by mouth every 8 (eight) hours as needed for nausea or vomiting. 05/16/23  Yes Sandie Cross, PA-C  rosuvastatin (CRESTOR) 10 MG tablet Take 10 mg by mouth daily.   Yes [provider]  Semaglutide -Weight Management (WEGOVY ) 0.5 MG/0.5ML SOAJ Inject 0.5 mg into the skin once a week. 12/13/23  Yes       Allergies    Penicillins, Shellfish allergy, and Aspirin    Review of Systems   Review of Systems  Physical Exam Updated Vital Signs BP (!) 117/52 (BP Location: Left Arm)   Pulse 84   Temp 98.6 F (37 C) (Oral)   Resp 16   SpO2 92%  Physical  Exam Vitals and nursing note reviewed.  Constitutional:      General: She is not in acute distress.    Appearance: She is well-developed.  HENT:     Head: Normocephalic and atraumatic.     Right Ear: External ear normal.     Left Ear: External ear normal.     Nose: Nose normal.  Eyes:     Extraocular Movements: Extraocular movements intact.     Conjunctiva/sclera: Conjunctivae normal.     Pupils: Pupils are equal, round, and reactive to light.  Pulmonary:     Effort: Pulmonary effort is normal. No respiratory distress.  Abdominal:     General: Abdomen is flat. There is no distension.     Palpations: Abdomen is soft. There is no mass.     Tenderness: There is abdominal tenderness (Left and right mid abdomen). There is no guarding.  Genitourinary:    Comments: Chaperoned by patient's RN Gray Layman. No external hemorrhoids or fissures noted on external inspection. No internal masses or hemorroids noted. Normal rectal tone.  Bright red blood present.  Musculoskeletal:     Cervical back: Normal range of motion and neck supple.     Right lower leg: No edema.     Left lower leg: No edema.  Skin:    General: Skin is warm and dry.  Neurological:  Mental Status: She is alert and oriented to person, place, and time. Mental status is at baseline.  Psychiatric:        Mood and Affect: Mood normal.     ED Results / Procedures / Treatments   Labs (all labs ordered are listed, but only abnormal results are displayed) Labs Reviewed  CBC WITH DIFFERENTIAL/PLATELET - Abnormal; Notable for the following components:      Result Value   WBC 13.1 (*)    Neutro Abs 11.6 (*)    All other components within normal limits  COMPREHENSIVE METABOLIC PANEL WITH GFR - Abnormal; Notable for the following components:   Glucose, Bld 121 (*)    Creatinine, Ser 1.20 (*)    GFR, Estimated 45 (*)    All other components within normal limits  URINALYSIS, ROUTINE W REFLEX MICROSCOPIC - Abnormal; Notable for  the following components:   Specific Gravity, Urine >1.046 (*)    Hgb urine dipstick SMALL (*)    All other components within normal limits  SEDIMENTATION RATE - Abnormal; Notable for the following components:   Sed Rate 31 (*)    All other components within normal limits  OCCULT BLOOD X 1 CARD TO LAB, STOOL - Abnormal; Notable for the following components:   Fecal Occult Bld POSITIVE (*)    All other components within normal limits  GASTROINTESTINAL PANEL BY PCR, STOOL (REPLACES STOOL CULTURE)  C DIFFICILE QUICK SCREEN W PCR REFLEX    LIPASE, BLOOD  C-REACTIVE PROTEIN  BASIC METABOLIC PANEL WITH GFR  CBC  PROTIME-INR  APTT  POC OCCULT BLOOD, ED  TYPE AND SCREEN    EKG None  Radiology CT ABDOMEN PELVIS W CONTRAST Result Date: 01/09/2024 CLINICAL DATA:  Abdominal pain, acute, nonlocalized. EXAM: CT ABDOMEN AND PELVIS WITH CONTRAST TECHNIQUE: Multidetector CT imaging of the abdomen and pelvis was performed using the standard protocol following bolus administration of intravenous contrast. RADIATION DOSE REDUCTION: This exam was performed according to the departmental dose-optimization program which includes automated exposure control, adjustment of the mA and/or kV according to patient size and/or use of iterative reconstruction technique. CONTRAST:  75mL OMNIPAQUE IOHEXOL 350 MG/ML SOLN COMPARISON:  None Available. FINDINGS: Lower chest: There are patchy atelectatic changes in the visualized lung bases. No overt consolidation. No pleural effusion. The heart is normal in size. No pericardial effusion. Hepatobiliary: The liver is normal in size. Non-cirrhotic configuration. No suspicious mass. These is mild diffuse hepatic steatosis. No intrahepatic or extrahepatic bile duct dilation. No calcified gallstones. Normal gallbladder wall thickness. No pericholecystic inflammatory changes. Pancreas: Unremarkable. No pancreatic ductal dilatation or surrounding inflammatory changes. Spleen: Within  normal limits. No focal lesion. Adrenals/Urinary Tract: Adrenal glands are unremarkable. No suspicious renal mass. No hydronephrosis. No renal or ureteric calculi. Urine bladder is only partially evaluated due to streak artifacts from right hip arthroplasty hardware. Stomach/Bowel: There is an approximately 20 cm long segment of left transverse colon exhibiting moderate circumferential wall thickening with mild to moderate pericolonic fat stranding, compatible with focal colitis. There is no associated abscess or collection. No pneumatosis, pneumoperitoneum or portal venous gas. Rest of the bowel loops are otherwise unremarkable. No disproportionate dilation of the small or large bowel loops. The appendix was not visualized; however there is no acute inflammatory process in the right lower quadrant. Vascular/Lymphatic: No abdominal or pelvic lymphadenopathy, by size criteria. No aneurysmal dilation of the major abdominal arteries. There are moderate peripheral atherosclerotic vascular calcifications of the aorta and its major branches. Reproductive: Evaluation  is limited due to streak artifacts from right hip arthroplasty. Uterus is likely surgically absent. No adnexal mass seen. Other: There is a tiny fat containing umbilical hernia. The soft tissues and abdominal wall are otherwise unremarkable. Musculoskeletal: No suspicious osseous lesions. There are mild multilevel degenerative changes in the visualized spine. IMPRESSION: 1. There is an approximately 20 cm long segment of left transverse colon exhibiting moderate circumferential wall thickening with mild to moderate pericolonic fat stranding, compatible with focal colitis. No associated abscess or collection. Colonoscopy is recommended after the acute episode subsides, unless recently performed, to exclude underlying neoplastic process. 2. Multiple other nonacute observations, as described above. Aortic Atherosclerosis (ICD10-I70.0). Electronically Signed   By:  Beula Brunswick M.D.   On: 01/09/2024 14:45    Procedures Procedures    Medications Ordered in ED Medications  acetaminophen  (TYLENOL ) tablet 650 mg (650 mg Oral Given 01/09/24 1724)    Or  acetaminophen  (TYLENOL ) suppository 650 mg ( Rectal See Alternative 01/09/24 1724)  senna-docusate (Senokot-S) tablet 1 tablet (has no administration in time range)  gabapentin  (NEURONTIN ) capsule 400 mg (400 mg Oral Given 01/09/24 1716)  rosuvastatin (CRESTOR) tablet 10 mg (10 mg Oral Given 01/09/24 1716)  melatonin tablet 3 mg (has no administration in time range)  morphine (MSIR) tablet 15 mg (15 mg Oral Given 01/09/24 1936)  ondansetron  (ZOFRAN -ODT) disintegrating tablet 8 mg (has no administration in time range)  metoCLOPramide  (REGLAN ) injection 10 mg (10 mg Intravenous Given 01/09/24 1354)  morphine (PF) 4 MG/ML injection 4 mg (4 mg Intravenous Given 01/09/24 1352)  pantoprazole (PROTONIX) injection 40 mg (40 mg Intravenous Given 01/09/24 1401)  morphine (PF) 4 MG/ML injection 4 mg (4 mg Intravenous Given 01/09/24 1443)  iohexol (OMNIPAQUE) 350 MG/ML injection 75 mL (75 mLs Intravenous Contrast Given 01/09/24 1435)  lactated ringers  bolus 1,000 mL (1,000 mLs Intravenous New Bag/Given 01/09/24 1809)    ED Course/ Medical Decision Making/ A&P Clinical Course as of 01/09/24 2035  Mon Jan 09, 2024  1501 CT ABDOMEN PELVIS W CONTRAST CT with possible colitis.  [RP]  1523 Amy Esterwood from [facility] is following.  [RP]  1555 Shelagh Derrick form IM teaching service.  [RP]    Clinical Course User Index [RP] Ninetta Basket, MD                                 Medical Decision Making Amount and/or Complexity of Data Reviewed Labs: ordered. Radiology:  Decision-making details documented in ED Course.  Risk Prescription drug management. Decision regarding hospitalization.   Zanita Redbird is a 83 y.o. female with comorbidities that complicate the patient evaluation including chronic pain, hiatal hernia, and  obesity on Wegovy  presents emergency department nausea and vomiting and diarrhea.   Initial Ddx:  Gastroenteritis, Wegovy  side effect, pancreatitis, colitis, partial small bowel obstruction  MDM/Course:  Patient presents emergency department with nausea and vomiting and diarrhea.  Also was having some abdominal pain.  On exam has reassuring vital signs and tenderness to palpation in her abdomen laterally.  Was given IV fluids, pain, and nausea medicine and upon re-evaluation was feeling much better.  Did have grossly bloody stool on exam that was Hemoccult positive.  White blood cell count was 13.1 and had a mild AKI.  Had a CT of the abdomen and pelvis which showed that she may have colitis.  They are recommending possible follow-up colonoscopy to rule out malignancy as well.  Suspect that her bleeding is likely due to colitis.  Started on antibiotics and inflammatory markers were sent.  Stool sample is still pending at this time.  Admitted to internal medicine teaching service with GI to see the patient as well.  This patient presents to the ED for concern of complaints listed in HPI, this involves an extensive number of treatment options, and is a complaint that carries with it a high risk of complications and morbidity. Disposition including potential need for admission considered.   Dispo: Admit to Floor  Records reviewed Outpatient Clinic Notes The following labs were independently interpreted: Chemistry and show AKI I independently reviewed the following imaging with scope of interpretation limited to determining acute life threatening conditions related to emergency care: CT Abdomen/Pelvis and agree with the radiologist interpretation with the following exceptions: none I personally reviewed and interpreted cardiac monitoring: normal sinus rhythm  I personally reviewed and interpreted the pt's EKG: see above for interpretation  I have reviewed the patients home medications and made  adjustments as needed Consults: Gastroenterology Social Determinants of health:  Geriatric  Portions of this note were generated with Scientist, clinical (histocompatibility and immunogenetics). Dictation errors may occur despite best attempts at proofreading.     Final Clinical Impression(s) / ED Diagnoses Final diagnoses:  Colitis  Gastrointestinal hemorrhage, unspecified gastrointestinal hemorrhage type  Nausea vomiting and diarrhea    Rx / DC Orders ED Discharge Orders     None         Ninetta Basket, MD 01/09/24 2035

## 2024-01-10 ENCOUNTER — Observation Stay (HOSPITAL_COMMUNITY)

## 2024-01-10 ENCOUNTER — Inpatient Hospital Stay (HOSPITAL_COMMUNITY)

## 2024-01-10 DIAGNOSIS — E785 Hyperlipidemia, unspecified: Secondary | ICD-10-CM | POA: Diagnosis present

## 2024-01-10 DIAGNOSIS — R1084 Generalized abdominal pain: Secondary | ICD-10-CM | POA: Diagnosis present

## 2024-01-10 DIAGNOSIS — Z96641 Presence of right artificial hip joint: Secondary | ICD-10-CM | POA: Diagnosis present

## 2024-01-10 DIAGNOSIS — I8390 Asymptomatic varicose veins of unspecified lower extremity: Secondary | ICD-10-CM | POA: Diagnosis present

## 2024-01-10 DIAGNOSIS — Z7985 Long-term (current) use of injectable non-insulin antidiabetic drugs: Secondary | ICD-10-CM | POA: Diagnosis not present

## 2024-01-10 DIAGNOSIS — R112 Nausea with vomiting, unspecified: Secondary | ICD-10-CM | POA: Diagnosis not present

## 2024-01-10 DIAGNOSIS — K559 Vascular disorder of intestine, unspecified: Secondary | ICD-10-CM | POA: Diagnosis present

## 2024-01-10 DIAGNOSIS — K633 Ulcer of intestine: Secondary | ICD-10-CM | POA: Diagnosis not present

## 2024-01-10 DIAGNOSIS — K529 Noninfective gastroenteritis and colitis, unspecified: Secondary | ICD-10-CM | POA: Diagnosis not present

## 2024-01-10 DIAGNOSIS — E669 Obesity, unspecified: Secondary | ICD-10-CM | POA: Diagnosis present

## 2024-01-10 DIAGNOSIS — K648 Other hemorrhoids: Secondary | ICD-10-CM | POA: Diagnosis present

## 2024-01-10 DIAGNOSIS — M792 Neuralgia and neuritis, unspecified: Secondary | ICD-10-CM | POA: Diagnosis present

## 2024-01-10 DIAGNOSIS — G8929 Other chronic pain: Secondary | ICD-10-CM | POA: Diagnosis present

## 2024-01-10 DIAGNOSIS — Z6830 Body mass index (BMI) 30.0-30.9, adult: Secondary | ICD-10-CM | POA: Diagnosis not present

## 2024-01-10 DIAGNOSIS — I129 Hypertensive chronic kidney disease with stage 1 through stage 4 chronic kidney disease, or unspecified chronic kidney disease: Secondary | ICD-10-CM | POA: Diagnosis present

## 2024-01-10 DIAGNOSIS — K3189 Other diseases of stomach and duodenum: Secondary | ICD-10-CM | POA: Diagnosis present

## 2024-01-10 DIAGNOSIS — Z833 Family history of diabetes mellitus: Secondary | ICD-10-CM | POA: Diagnosis not present

## 2024-01-10 DIAGNOSIS — K922 Gastrointestinal hemorrhage, unspecified: Secondary | ICD-10-CM | POA: Diagnosis not present

## 2024-01-10 DIAGNOSIS — M549 Dorsalgia, unspecified: Secondary | ICD-10-CM | POA: Diagnosis present

## 2024-01-10 DIAGNOSIS — K6389 Other specified diseases of intestine: Secondary | ICD-10-CM | POA: Diagnosis not present

## 2024-01-10 DIAGNOSIS — Z90710 Acquired absence of both cervix and uterus: Secondary | ICD-10-CM | POA: Diagnosis not present

## 2024-01-10 DIAGNOSIS — N1831 Chronic kidney disease, stage 3a: Secondary | ICD-10-CM | POA: Diagnosis present

## 2024-01-10 DIAGNOSIS — K644 Residual hemorrhoidal skin tags: Secondary | ICD-10-CM | POA: Diagnosis present

## 2024-01-10 DIAGNOSIS — M1611 Unilateral primary osteoarthritis, right hip: Secondary | ICD-10-CM | POA: Diagnosis present

## 2024-01-10 DIAGNOSIS — Z87891 Personal history of nicotine dependence: Secondary | ICD-10-CM | POA: Diagnosis not present

## 2024-01-10 DIAGNOSIS — I700 Atherosclerosis of aorta: Secondary | ICD-10-CM | POA: Diagnosis present

## 2024-01-10 DIAGNOSIS — Z66 Do not resuscitate: Secondary | ICD-10-CM | POA: Diagnosis present

## 2024-01-10 DIAGNOSIS — K2950 Unspecified chronic gastritis without bleeding: Secondary | ICD-10-CM | POA: Diagnosis present

## 2024-01-10 LAB — C DIFFICILE QUICK SCREEN W PCR REFLEX
C Diff antigen: NEGATIVE
C Diff interpretation: NOT DETECTED
C Diff toxin: NEGATIVE

## 2024-01-10 LAB — BASIC METABOLIC PANEL WITH GFR
Anion gap: 9 (ref 5–15)
BUN: 14 mg/dL (ref 8–23)
CO2: 24 mmol/L (ref 22–32)
Calcium: 8.2 mg/dL — ABNORMAL LOW (ref 8.9–10.3)
Chloride: 105 mmol/L (ref 98–111)
Creatinine, Ser: 1.05 mg/dL — ABNORMAL HIGH (ref 0.44–1.00)
GFR, Estimated: 53 mL/min — ABNORMAL LOW (ref >60)
Glucose, Bld: 86 mg/dL (ref 70–99)
Potassium: 4.3 mmol/L (ref 3.5–5.1)
Sodium: 138 mmol/L (ref 135–145)

## 2024-01-10 LAB — CBC
HCT: 35.3 % — ABNORMAL LOW (ref 36.0–46.0)
Hemoglobin: 11.1 g/dL — ABNORMAL LOW (ref 12.0–15.0)
MCH: 28.7 pg (ref 26.0–34.0)
MCHC: 31.4 g/dL (ref 30.0–36.0)
MCV: 91.2 fL (ref 80.0–100.0)
Platelets: 197 10*3/uL (ref 150–400)
RBC: 3.87 MIL/uL (ref 3.87–5.11)
RDW: 12.5 % (ref 11.5–15.5)
WBC: 9.6 10*3/uL (ref 4.0–10.5)
nRBC: 0 % (ref 0.0–0.2)

## 2024-01-10 LAB — PROTIME-INR
INR: 1.1 (ref 0.8–1.2)
Prothrombin Time: 14.1 s (ref 11.4–15.2)

## 2024-01-10 LAB — APTT: aPTT: 28 s (ref 24–36)

## 2024-01-10 MED ORDER — LACTATED RINGERS IV BOLUS: 250.0000 mL | Freq: Once | INTRAVENOUS | Status: DC

## 2024-01-10 MED ORDER — PANTOPRAZOLE SODIUM 40 MG IV SOLR
40.0000 mg | INTRAVENOUS | Status: AC
  Administered 2024-01-10 – 2024-01-14 (×5): 40 mg via INTRAVENOUS
  Filled 2024-01-10 (×5): qty 10

## 2024-01-10 MED ORDER — MORPHINE SULFATE (PF) 4 MG/ML IV SOLN
4.0000 mg | Freq: Four times a day (QID) | INTRAVENOUS | Status: AC | PRN
  Administered 2024-01-10 – 2024-01-11 (×6): 4 mg via INTRAVENOUS
  Filled 2024-01-10 (×6): qty 1

## 2024-01-10 MED ORDER — LACTATED RINGERS IV BOLUS
1000.0000 mL | Freq: Once | INTRAVENOUS | Status: AC
  Administered 2024-01-10: 1000 mL via INTRAVENOUS

## 2024-01-10 MED ORDER — SODIUM CHLORIDE 0.9% FLUSH
10.0000 mL | Freq: Two times a day (BID) | INTRAVENOUS | Status: DC
  Administered 2024-01-10 – 2024-01-11 (×3): 10 mL
  Administered 2024-01-11: 40 mL
  Administered 2024-01-12 – 2024-01-14 (×4): 10 mL

## 2024-01-10 MED ORDER — SODIUM CHLORIDE 0.9% FLUSH: 10.0000 mL | INTRAVENOUS | Status: DC | PRN

## 2024-01-10 NOTE — Care Management Obs Status (Signed)
 MEDICARE OBSERVATION STATUS NOTIFICATION   Patient Details  Name: Anne Terrell MRN: [id number] Date of Birth: 1940-10-01   Medicare Observation Status Notification Given:  Yes    Omie Bickers, RN 01/10/2024, 11:47 AM

## 2024-01-10 NOTE — Progress Notes (Addendum)
 Evaluated patient at bedside for acute 8/10 abdominal pain. Patient reported that she had a short episode of severe abdominal pain that resolved by the time we evaluated her. She stated that the pain she had was not as severe as her pain when she first was admitted. She denied nausea, vomiting, pain with movement. She has not had a BM since she was admitted. She denies fluctuance. Exam reveals tenderness to light palpation of the LUQ, RUQ, LLQ, suprapubic, and epigastric regions of her abdomen, bowel sounds present.   Her exam is not consistent with an acute abdomen at this time. Since her severe pain has resolved, her abdominal exam is not consistent with an acute abdomen, and she is hemodynamically stable; we will not be ordering imaging at this time.  Vitals:   01/09/24 1225 01/09/24 1630 01/09/24 1711 01/09/24 2005  BP: 137/67 (!) 123/51 (!) 122/53 (!) 117/52  Pulse: 93 84 81 84  Temp: 98.2 F (36.8 C)  98.4 F (36.9 C) 98.6 F (37 C)  Resp: 20   16  SpO2: 98% 99% 95% 92%  TempSrc: Oral  Oral Oral     Plan: -Morphine 4mg  q6prn for severe abdominal pain

## 2024-01-10 NOTE — TOC CM/SW Note (Signed)
 Transition of Care [facility]) - Inpatient Brief Assessment   Patient Details  Name: Anne Terrell MRN: [id number] Date of Birth: [birth year]  Transition of Care [facility]) CM/SW Contact:    Juliane Och, LCSW Phone Number: 01/10/2024, 10:16 AM   Clinical Narrative:  10:16 AM Per chart review, patient was brought in by EMS from home. Patient has a PCP and insurance. Patient has HH/DME/SNF history. Patient was active with [facility] and ordered a BSC, rolling walker, and 3-in-1. Patient has admitted to [facility]. No TOC needs were identified at this time. TOC will continue to follow and be available to assist.  Transition of Care Asessment: Insurance and Status: Insurance coverage has been reviewed Patient has primary care physician: Yes Home environment has been reviewed: Private Residence Prior level of function:: N/A Prior/Current Home Services: No current home services (Has HH/DME history) Social Drivers of Health Review: SDOH reviewed no interventions necessary Readmission risk has been reviewed: Yes Transition of care needs: no transition of care needs at this time

## 2024-01-10 NOTE — Progress Notes (Signed)

## 2024-01-10 NOTE — Plan of Care (Signed)

## 2024-01-10 NOTE — Progress Notes (Signed)
                 Subjective 8/10 abdominal pain with two episodes of watery BM with "coffee grounds", also noticed BRBPR  Physical exam Blood pressure 122/65, pulse 83, temperature 98.4 F (36.9 C), temperature source Oral, resp. rate 18, SpO2 93%.  Constitutional: obese, lying in bed, in no acute distress Cardiovascular: regular rate and rhythm, no m/r/g Pulmonary/Chest: normal work of breathing on room air, lungs clear to auscultation bilaterally Abdominal: normoactive bowel sounds, soft, non-distended, moderate TTP in left/right mid abdomen, no guarding Neurological: alert & oriented Skin: warm and dry   Weight change:    Intake/Output Summary (Last 24 hours) at 01/10/2024 1326 Last data filed at 01/10/2024 1310 Gross per 24 hour  Intake 483.21 ml  Output --  Net 483.21 ml   Net IO Since Admission: 483.21 mL [01/10/24 1326]  Labs, images, and other studies    Latest Ref Rng & Units 01/10/2024    4:42 AM 01/09/2024   12:32 PM 09/21/2019    9:52 AM  CBC  WBC 4.0 - 10.5 K/uL 9.6  13.1    Hemoglobin 12.0 - 15.0 g/dL 78.2  95.6  21.3   Hematocrit 36.0 - 46.0 % 35.3  43.9  41.2   Platelets 150 - 400 K/uL 197  258         Latest Ref Rng & Units 01/10/2024    4:42 AM 01/09/2024   12:32 PM 03/24/2018    5:34 AM  BMP  Glucose 70 - 99 mg/dL 86  086  99   BUN 8 - 23 mg/dL 14  22  14    Creatinine 0.44 - 1.00 mg/dL 5.78  4.69  6.29   Sodium 135 - 145 mmol/L 138  141  143   Potassium 3.5 - 5.1 mmol/L 4.3  4.9  4.3   Chloride 98 - 111 mmol/L 105  104  104   CO2 22 - 32 mmol/L 24  25  32   Calcium 8.9 - 10.3 mg/dL 8.2  9.3  8.4      Assessment and plan Hospital day 1  Anne Terrell is a 83 y.o.female with past medical history of hypertension, chronic back pain, hypertension, CKD 3A, hyperlipidemia, obesity who presents with abdominal pain, nausea, vomiting, and one episode of melena. Admitted for focal colitis.   Focal Colitis GI consulted, appreciate recommendations. Symptoms  worsened last night and given recurrent abdominal pain/melena/hematochezia will proceed with GI stool testing. Leukocytosis has resolved and she remains HDS. I am concerned with either infectious or ischemic colitis. DG abdomen is thankfully reassuring though this was a supine study. Per GI, will order upright DG abdomen to further clarify.  -Follow-up GI recommendations, GI stool panel + C. diff -Trend CBC/BMP   Elevated Creatinine CKD 3A At baseline   Chronic Back Pain Neuralgia Continue home Neurontin  and Morphine   HTN BP is stable. Holding Lisinopril.   HLD Continue home Crestor   Aortic Atherosclerosis As noted on CTAP. Crestor per above.   Diet:  Clear Liquid VTE: SCDs IVF: LR,Bolus Code: DNR/DNI Ronni Colace, DO 01/10/2024, 1:26 PM  Pager: [phone] After 5pm or weekend: [phone]

## 2024-01-10 NOTE — Consult Note (Addendum)
 [facility] Gastroenterology Consult  Referring Provider: Internal medicine residency team Primary Care Physician:  Beam, Conway Dennis, MD Primary Gastroenterologist: Dr.Brahmbhatt  Reason for Consultation: Abnormal CAT scan, rectal bleeding, abdominal pain  HPI: Anne Terrell is a 83 y.o. female states she was in her usual state of health until 6 AM yesterday when she woke up with severe left and right sided abdominal pain, cramping and had very loose bowel movement.  She was nauseous and had vomiting while on the commode.  She went to bed at 7 AM and woke up at 11 AM with continued cramping, had another episode of diarrhea but this time noted bright red blood and decided to come to the ER. Patient reports 3 more episodes of loose and bloody bowel movement and continued abdominal pain since admission.  Patient states this is the first time she has noted blood in the stool. She usually has regular bowel movements. She denies unintentional weight loss or loss of appetite. She denies recent travel, sick contact, history of antibiotic use or history of C. difficile infection. She denies acid reflux, heartburn, difficulty swallowing, pain on swallowing, bloating, early satiety. She is a widow, lives at home by herself, is originally from [place], her next of kin is her brother-in-law who lives in [place]. Patient states that she was started on Wegovy  2 months ago for weight loss.  Previous GI workup: Colonoscopy 07/2022, D.Brahmbhatt, IDA: tortuous colon, fair prep, internal hemorrhoids, no repeat due to age EGD 07/2022, epigastric pain: chronic gastritis, erythematous duodenopathy, biopsies showed chronic inactive gastritis with intestinal metaplasia, No H pylori  Past Medical History:  Diagnosis Date   Arthralgia    Carpal tunnel syndrome    DDD (degenerative disc disease), cervical    DDD (degenerative disc disease), lumbar    Full dentures    History of hiatal hernia    Hypertension     Myalgia    Myofascial pain    Narcotic dependence (HCC)    Osteoarthritis    Primary localized osteoarthritis of right hip    Right rotator cuff tear    Spondylosis    Varicose veins    Wears glasses     Past Surgical History:  Procedure Laterality Date   ABDOMINAL HYSTERECTOMY  1988   APPENDECTOMY     CARPAL TUNNEL RELEASE Bilateral    COLONOSCOPY WITH ESOPHAGOGASTRODUODENOSCOPY (EGD)     dental posts     holds bottom dentures in place   DILATION AND CURETTAGE OF UTERUS     FRACTURE SURGERY Left    LLE   MULTIPLE TOOTH EXTRACTIONS     SPINAL CORD STIMULATOR INSERTION     located near the right hip and waist area   SPINAL CORD STIMULATOR REMOVAL N/A 09/21/2019   Procedure: LUMBAR SPINAL CORD STIMULATOR REMOVAL;  Surgeon: Gerri Kras, MD;  Location: [facility] OR;  Service: Neurosurgery;  Laterality: N/A;  LUMBAR SPINAL CORD STIMULATOR REMOVAL   TONSILLECTOMY     TOTAL HIP ARTHROPLASTY Right 03/20/2018   Procedure: RIGHT TOTAL HIP ARTHROPLASTY ANTERIOR APPROACH;  Surgeon: Wes Hamman, MD;  Location: [facility] ORS;  Service: Orthopedics;  Laterality: Right;   TRIGGER FINGER RELEASE Left 05/25/2023   Procedure: LEFT MIDDLE TRIGGER FINGER RELEASE;  Surgeon: Wes Hamman, MD;  Location: [facility];  Service: Orthopedics;  Laterality: Left;    Prior to Admission medications   Medication Sig Start Date End Date Taking? Authorizing Provider  gabapentin  (NEURONTIN ) 400 MG capsule Take 400 mg by mouth 4 (  four) times daily.   Yes [provider]  lisinopril (ZESTRIL) 10 MG tablet Take 10 mg by mouth daily.   Yes [provider]  morphine (MSIR) 15 MG tablet Take 15 mg by mouth 4 (four) times daily. 12/20/23  Yes [provider]  ondansetron  (ZOFRAN ) 4 MG tablet Take 1 tablet (4 mg total) by mouth every 8 (eight) hours as needed for nausea or vomiting. 05/16/23  Yes Sandie Cross, PA-C  rosuvastatin (CRESTOR) 10 MG tablet Take 10 mg by mouth daily.   Yes  [provider]  Semaglutide -Weight Management (WEGOVY ) 0.5 MG/0.5ML SOAJ Inject 0.5 mg into the skin once a week. 12/13/23  Yes     Current Facility-Administered Medications  Medication Dose Route Frequency Provider Last Rate Last Admin   acetaminophen  (TYLENOL ) tablet 650 mg  650 mg Oral Q6H PRN Ronni Colace, DO   650 mg at 01/09/24 1724   Or   acetaminophen  (TYLENOL ) suppository 650 mg  650 mg Rectal Q6H PRN Ronni Colace, DO       gabapentin  (NEURONTIN ) capsule 400 mg  400 mg Oral QID Ronni Colace, DO   400 mg at 01/10/24 1000   lactated ringers  bolus 250 mL  250 mL Intravenous Once Ronni Colace, DO       melatonin tablet 3 mg  3 mg Oral QHS PRN Cathey Clunes, MD       morphine (MSIR) tablet 15 mg  15 mg Oral QID Ronni Colace, DO   15 mg at 01/10/24 1000   morphine (PF) 4 MG/ML injection 4 mg  4 mg Intravenous Q6H PRN Aurora Lees, DO   4 mg at 01/10/24 0751   ondansetron  (ZOFRAN -ODT) disintegrating tablet 8 mg  8 mg Oral Q8H PRN Ronni Colace, DO       pantoprazole (PROTONIX) injection 40 mg  40 mg Intravenous Q24H Gomez-Caraballo, Maria, MD   40 mg at 01/10/24 1235   rosuvastatin (CRESTOR) tablet 10 mg  10 mg Oral Daily Ronni Colace, DO   10 mg at 01/10/24 1000   senna-docusate (Senokot-S) tablet 1 tablet  1 tablet Oral QHS PRN Ronni Colace, DO        Allergies as of 01/09/2024 - Review Complete 01/09/2024  Allergen Reaction Noted   Penicillins Rash 05/03/2014   Shellfish allergy Nausea And Vomiting and Other (See Comments) 05/03/2014   Aspirin Other (See Comments) 01/13/2016    Family History  Problem Relation Age of Onset   Diabetes Mother    Varicose Veins Mother    Asthma Mother    Heart defect Sister     Social History   Socioeconomic History   Marital status: Widowed    Spouse name: Not on file   Number of children: 0   Years of education: Not on file   Highest education level: Some college, no degree  Occupational History   Not on file   Tobacco Use   Smoking status: Former    Current packs/day: 0.00    Average packs/day: 1 pack/day for 30.0 years (30.0 ttl pk-yrs)    Types: Cigarettes    Start date: 05/03/1974    Quit date: 05/03/2004    Years since quitting: 19.7   Smokeless tobacco: Never  Vaping Use   Vaping status: Never Used  Substance and Sexual Activity   Alcohol  use: No    Alcohol /week: 0.0 standard drinks of alcohol    Drug use: No   Sexual activity: Not Currently  Other Topics Concern   Not  on file  Social History Narrative   Lives at home alone   Right handed   2 cups of caffeine daily   Social Drivers of Health   Financial Resource Strain: Low Risk  (11/01/2023)   Received from [facility]   Overall Financial Resource Strain (CARDIA)    Difficulty of Paying Living Expenses: Not hard at all  Food Insecurity: No Food Insecurity (01/10/2024)   Hunger Vital Sign    Worried About Running Out of Food in the Last Year: Never true    Ran Out of Food in the Last Year: Never true  Transportation Needs: No Transportation Needs (01/10/2024)   PRAPARE - Administrator, Civil Service (Medical): No    Lack of Transportation (Non-Medical): No  Physical Activity: Unknown (08/01/2023)   Received from [facility]   Exercise Vital Sign    Days of Exercise per Week: 0 days    Minutes of Exercise per Session: Not on file  Stress: No Stress Concern Present (08/01/2023)   Received from [facility] Occupational [facility] - Occupational Stress Questionnaire    Feeling of Stress : Not at all  Social Connections: Unknown (01/10/2024)   Social Connection and Isolation Panel [NHANES]    Frequency of Communication with Friends and Family: More than three times a week    Frequency of Social Gatherings with Friends and Family: Once a week    Attends Religious Services: Not on Insurance claims handler of Clubs or Organizations: No    Attends Banker Meetings: Never    Marital  Status: Widowed  Intimate Partner Violence: Not At Risk (01/10/2024)   Humiliation, Afraid, Rape, and Kick questionnaire    Fear of Current or Ex-Partner: No    Emotionally Abused: No    Physically Abused: No    Sexually Abused: No    Review of Systems: As per HPI Physical Exam: Vital signs in last 24 hours: Temp:  [97.8 F (36.6 C)-98.6 F (37 C)] 98.4 F (36.9 C) (05/06 0754) Pulse Rate:  [79-84] 83 (05/06 0754) Resp:  [16-18] 18 (05/06 0754) BP: (117-124)/(46-65) 122/65 (05/06 0754) SpO2:  [92 %-99 %] 93 % (05/06 0754) Last BM Date : 01/10/24  General:   Alert,  Well-developed, well-nourished, pleasant and cooperative in NAD Head:  Normocephalic and atraumatic. Eyes:  Sclera clear, no icterus.   Conjunctiva pink. Ears:  Normal auditory acuity. Nose:  No deformity, discharge,  or lesions. Mouth:  No deformity or lesions.  Oropharynx pink & moist. Neck:  Supple; no masses or thyromegaly. Lungs: Able to speak in full sentences, on room air, no acute distress. Heart:  Regular rate and rhythm; no murmurs, clicks, rubs,  or gallops. Extremities:  Without clubbing or edema. Neurologic:  Alert and  oriented x4;  grossly normal neurologically. Skin:  Intact without significant lesions or rashes. Psych:  Alert and cooperative. Normal mood and affect. Abdomen: Tenderness noted in right and left lower quadrant, mild rebound tenderness, no rigidity, no guarding     Lab Results: Recent Labs    01/09/24 1232 01/10/24 0442  WBC 13.1* 9.6  HGB 13.5 11.1*  HCT 43.9 35.3*  PLT 258 197   BMET Recent Labs    01/09/24 1232 01/10/24 0442  NA 141 138  K 4.9 4.3  CL 104 105  CO2 25 24  GLUCOSE 121* 86  BUN 22 14  CREATININE 1.20* 1.05*  CALCIUM 9.3 8.2*   LFT  Recent Labs    01/09/24 1232  PROT 7.0  ALBUMIN 4.1  AST 23  ALT 17  ALKPHOS 60  BILITOT 0.7   PT/INR Recent Labs    01/10/24 0442  LABPROT 14.1  INR 1.1    Studies/Results: DG Abd 1 View Result Date:  01/10/2024 CLINICAL DATA:  Abdominal pain. EXAM: ABDOMEN - 1 VIEW COMPARISON:  March 31, 2021. FINDINGS: The bowel gas pattern is normal. No radio-opaque calculi or other significant radiographic abnormality are seen. IMPRESSION: No abnormal bowel dilatation. Electronically Signed   By: Rosalene Colon M.D.   On: 01/10/2024 13:33   CT ABDOMEN PELVIS W CONTRAST Result Date: 01/09/2024 CLINICAL DATA:  Abdominal pain, acute, nonlocalized. EXAM: CT ABDOMEN AND PELVIS WITH CONTRAST TECHNIQUE: Multidetector CT imaging of the abdomen and pelvis was performed using the standard protocol following bolus administration of intravenous contrast. RADIATION DOSE REDUCTION: This exam was performed according to the departmental dose-optimization program which includes automated exposure control, adjustment of the mA and/or kV according to patient size and/or use of iterative reconstruction technique. CONTRAST:  75mL OMNIPAQUE IOHEXOL 350 MG/ML SOLN COMPARISON:  None Available. FINDINGS: Lower chest: There are patchy atelectatic changes in the visualized lung bases. No overt consolidation. No pleural effusion. The heart is normal in size. No pericardial effusion. Hepatobiliary: The liver is normal in size. Non-cirrhotic configuration. No suspicious mass. These is mild diffuse hepatic steatosis. No intrahepatic or extrahepatic bile duct dilation. No calcified gallstones. Normal gallbladder wall thickness. No pericholecystic inflammatory changes. Pancreas: Unremarkable. No pancreatic ductal dilatation or surrounding inflammatory changes. Spleen: Within normal limits. No focal lesion. Adrenals/Urinary Tract: Adrenal glands are unremarkable. No suspicious renal mass. No hydronephrosis. No renal or ureteric calculi. Urine bladder is only partially evaluated due to streak artifacts from right hip arthroplasty hardware. Stomach/Bowel: There is an approximately 20 cm long segment of left transverse colon exhibiting moderate circumferential  wall thickening with mild to moderate pericolonic fat stranding, compatible with focal colitis. There is no associated abscess or collection. No pneumatosis, pneumoperitoneum or portal venous gas. Rest of the bowel loops are otherwise unremarkable. No disproportionate dilation of the small or large bowel loops. The appendix was not visualized; however there is no acute inflammatory process in the right lower quadrant. Vascular/Lymphatic: No abdominal or pelvic lymphadenopathy, by size criteria. No aneurysmal dilation of the major abdominal arteries. There are moderate peripheral atherosclerotic vascular calcifications of the aorta and its major branches. Reproductive: Evaluation is limited due to streak artifacts from right hip arthroplasty. Uterus is likely surgically absent. No adnexal mass seen. Other: There is a tiny fat containing umbilical hernia. The soft tissues and abdominal wall are otherwise unremarkable. Musculoskeletal: No suspicious osseous lesions. There are mild multilevel degenerative changes in the visualized spine. IMPRESSION: 1. There is an approximately 20 cm long segment of left transverse colon exhibiting moderate circumferential wall thickening with mild to moderate pericolonic fat stranding, compatible with focal colitis. No associated abscess or collection. Colonoscopy is recommended after the acute episode subsides, unless recently performed, to exclude underlying neoplastic process. 2. Multiple other nonacute observations, as described above. Aortic Atherosclerosis (ICD10-I70.0). Electronically Signed   By: Beula Brunswick M.D.   On: 01/09/2024 14:45    Impression: Severe abdominal pain, diarrhea, rectal bleeding with CAT scan showing 20 cm segment of left transverse colitis Occult blood positive in stool  WBC 13.1 on admission which is normalized 9.6 Hemoglobin 13.5 which is now 11.1 Elevated ESR 31 with a normal CRP of 0.8 Mild  renal impairment, BUN 14, creatinine 1.05, GFR  53 Abdominal x-ray shows no abnormal bowel dilation   Comorbidities: Severe back pain requiring morphine 15 mg 4 times a day and gabapentin  gram 4 times a day, hypertension, dyslipidemia   Plan: Recommend stool for C. difficile and GI pathogen panel. She has been given lactated Ringer 's 1L X 2 IV bolus and is supposed to infusion of lactated Ringer 's at 100 cc an hour. Without fever and improvement in leukocytosis, we may not need antibiotics at the moment. I will start on clear liquid diet and follow stool studies. She may benefit from a flexible sigmoidoscopy or colonoscopy in the next few days depending upon clinical course. Dr. Lavaughn Portland will follow in am.   LOS: 0 days   Genell Ken, MD  01/10/2024, 2:09 PM

## 2024-01-11 ENCOUNTER — Inpatient Hospital Stay (HOSPITAL_COMMUNITY)

## 2024-01-11 DIAGNOSIS — K529 Noninfective gastroenteritis and colitis, unspecified: Secondary | ICD-10-CM | POA: Diagnosis not present

## 2024-01-11 LAB — GASTROINTESTINAL PANEL BY PCR, STOOL (REPLACES STOOL CULTURE)

## 2024-01-11 LAB — BASIC METABOLIC PANEL WITH GFR
Anion gap: 8 (ref 5–15)
BUN: 10 mg/dL (ref 8–23)
CO2: 25 mmol/L (ref 22–32)
Calcium: 8.2 mg/dL — ABNORMAL LOW (ref 8.9–10.3)
Chloride: 102 mmol/L (ref 98–111)
Creatinine, Ser: 1.14 mg/dL — ABNORMAL HIGH (ref 0.44–1.00)
GFR, Estimated: 48 mL/min — ABNORMAL LOW (ref >60)
Glucose, Bld: 192 mg/dL — ABNORMAL HIGH (ref 70–99)
Potassium: 3.5 mmol/L (ref 3.5–5.1)
Sodium: 135 mmol/L (ref 135–145)

## 2024-01-11 LAB — CBC
HCT: 35.7 % — ABNORMAL LOW (ref 36.0–46.0)
Hemoglobin: 11.4 g/dL — ABNORMAL LOW (ref 12.0–15.0)
MCH: 29.2 pg (ref 26.0–34.0)
MCHC: 31.9 g/dL (ref 30.0–36.0)
MCV: 91.5 fL (ref 80.0–100.0)
Platelets: 184 10*3/uL (ref 150–400)
RBC: 3.9 MIL/uL (ref 3.87–5.11)
RDW: 12.2 % (ref 11.5–15.5)
WBC: 12.5 10*3/uL — ABNORMAL HIGH (ref 4.0–10.5)
nRBC: 0 % (ref 0.0–0.2)

## 2024-01-11 MED ORDER — IOHEXOL 350 MG/ML SOLN
75.0000 mL | Freq: Once | INTRAVENOUS | Status: AC | PRN
  Administered 2024-01-11: 75 mL via INTRAVENOUS

## 2024-01-11 NOTE — Progress Notes (Signed)
 Placed 20g for CT at this time, recommended to remove midline to primary RN due to significant redness/pink around insertion site.

## 2024-01-11 NOTE — Progress Notes (Signed)
                 Subjective Continued abdominal pain and diarrhea with noticeable blood. She is also frustrated with inadequate pain control as only the IV morphine has been helping.  Physical exam Blood pressure (!) 119/59, pulse 88, temperature 98 F (36.7 C), resp. rate 18, SpO2 91%.  HENT: dry mucous membranes Constitutional: obese, sitting up in bed, in no acute distress Cardiovascular: regular rate and rhythm, no m/r/g Abdominal: normoactive bowel sounds, soft, non-distended, moderate TTP in left/right mid abdomen, no guarding Neurological: alert & oriented Skin: warm and dry  Weight change:    Intake/Output Summary (Last 24 hours) at 01/11/2024 1021 Last data filed at 01/10/2024 1636 Gross per 24 hour  Intake 522.27 ml  Output --  Net 522.27 ml   Net IO Since Admission: 522.27 mL [01/11/24 1021]  Labs, images, and other studies    Latest Ref Rng & Units 01/10/2024    4:42 AM 01/09/2024   12:32 PM 09/21/2019    9:52 AM  CBC  WBC 4.0 - 10.5 K/uL 9.6  13.1    Hemoglobin 12.0 - 15.0 g/dL 81.1  91.4  78.2   Hematocrit 36.0 - 46.0 % 35.3  43.9  41.2   Platelets 150 - 400 K/uL 197  258         Latest Ref Rng & Units 01/10/2024    4:42 AM 01/09/2024   12:32 PM 03/24/2018    5:34 AM  BMP  Glucose 70 - 99 mg/dL 86  956  99   BUN 8 - 23 mg/dL 14  22  14    Creatinine 0.44 - 1.00 mg/dL 2.13  0.86  5.78   Sodium 135 - 145 mmol/L 138  141  143   Potassium 3.5 - 5.1 mmol/L 4.3  4.9  4.3   Chloride 98 - 111 mmol/L 105  104  104   CO2 22 - 32 mmol/L 24  25  32   Calcium 8.9 - 10.3 mg/dL 8.2  9.3  8.4      Assessment and plan Hospital day 2  Anne Terrell is a 83 y.o.female with past medical history of hypertension, chronic back pain, hypertension, CKD 3A, hyperlipidemia, obesity who presents with abdominal pain, nausea, vomiting, and one episode of melena. Admitted for focal colitis.   Focal Colitis GI consulted, appreciate recommendations. Symptoms continue. C diff is  negative and GI stool panel pending. Given refractory nature, will order CT Angiogram to evaluate for ischemic colitis. I communicated with RN this morning regarding plan for pain control and that we are treating two different forms of pain. The IV morphine improves abdominal pain so will continue to utilize this prn in addition to scheduled home po morphine for back pain. Given upcoming CTA and reduced po intake, will bolus the remaining IVF's from yesterday and encourage po hydration as tolerated. She remains on clear liquid diet.  -Follow-up GI recs/CTA -Trend labs   Elevated Creatinine CKD 3A Repeat BMP pending. Kidney function was at or better than baseline yesterday.  -Trend   Chronic Back Pain Neuralgia Continue home Neurontin  and Morphine   HTN BP is stable. Holding Lisinopril.   HLD Continue home Crestor   Aortic Atherosclerosis As noted on CTAP. Crestor per above.     Ronni Colace, DO 01/11/2024, 10:21 AM  Pager: [phone] After 5pm or weekend: [phone]

## 2024-01-11 NOTE — Plan of Care (Signed)
  Problem: Education: Goal: Knowledge of General Education information will improve Description: Including pain rating scale, medication(s)/side effects and non-pharmacologic comfort measures Outcome: Progressing   Problem: Health Behavior/Discharge Planning: Goal: Ability to manage health-related needs will improve Outcome: Progressing   Problem: Clinical Measurements: Goal: Ability to maintain clinical measurements within normal limits will improve Outcome: Progressing Goal: Cardiovascular complication will be avoided Outcome: Progressing   Problem: Activity: Goal: Risk for activity intolerance will decrease Outcome: Progressing   Problem: Coping: Goal: Level of anxiety will decrease Outcome: Progressing   Problem: Pain Managment: Goal: General experience of comfort will improve and/or be controlled Outcome: Progressing

## 2024-01-11 NOTE — Progress Notes (Signed)
 Anne Terrell 12:09 PM  Subjective: Patient seen and examined and case discussed with my partner Dr. Feliberto Hopping and her hospital computer chart reviewed and we discussed ischemic colitis she has no new complaints and we answered all of her questions in her office computer chart was reviewed specifically her previous colonoscopy  Objective: Vital signs stable afebrile no acute distress abdomen is soft mostly upper compared to lower discomfort no guarding or rebound follow-up x-rays okay white count slight increased hemoglobin stable chemistries okay GI pathogen panel negative  Assessment: Probable ischemic colitis as above I do think with being on chronic morphine chronic back problems he may have a low pain tolerance  Plan: Continue clear liquids for now we will check on tomorrow and probably proceed with colonoscopy to the transverse colon to confirm a diagnosis and patient agrees with the plan  [facility]  office [phone] After 5PM or if no answer call [phone]

## 2024-01-12 DIAGNOSIS — R112 Nausea with vomiting, unspecified: Secondary | ICD-10-CM

## 2024-01-12 DIAGNOSIS — K922 Gastrointestinal hemorrhage, unspecified: Secondary | ICD-10-CM | POA: Diagnosis not present

## 2024-01-12 DIAGNOSIS — K529 Noninfective gastroenteritis and colitis, unspecified: Secondary | ICD-10-CM | POA: Diagnosis not present

## 2024-01-12 LAB — BASIC METABOLIC PANEL WITH GFR
Anion gap: 11 (ref 5–15)
BUN: 6 mg/dL — ABNORMAL LOW (ref 8–23)
CO2: 27 mmol/L (ref 22–32)
Calcium: 8.5 mg/dL — ABNORMAL LOW (ref 8.9–10.3)
Chloride: 103 mmol/L (ref 98–111)
Creatinine, Ser: 1.07 mg/dL — ABNORMAL HIGH (ref 0.44–1.00)
GFR, Estimated: 52 mL/min — ABNORMAL LOW (ref >60)
Glucose, Bld: 93 mg/dL (ref 70–99)
Potassium: 3.8 mmol/L (ref 3.5–5.1)
Sodium: 141 mmol/L (ref 135–145)

## 2024-01-12 LAB — CBC
HCT: 36.5 % (ref 36.0–46.0)
Hemoglobin: 11.6 g/dL — ABNORMAL LOW (ref 12.0–15.0)
MCH: 28.7 pg (ref 26.0–34.0)
MCHC: 31.8 g/dL (ref 30.0–36.0)
MCV: 90.3 fL (ref 80.0–100.0)
Platelets: 187 10*3/uL (ref 150–400)
RBC: 4.04 MIL/uL (ref 3.87–5.11)
RDW: 12.3 % (ref 11.5–15.5)
WBC: 9.9 10*3/uL (ref 4.0–10.5)
nRBC: 0 % (ref 0.0–0.2)

## 2024-01-12 MED ORDER — MORPHINE SULFATE (PF) 4 MG/ML IV SOLN
4.0000 mg | Freq: Once | INTRAVENOUS | Status: AC
  Administered 2024-01-12: 4 mg via INTRAVENOUS
  Filled 2024-01-12: qty 1

## 2024-01-12 MED ORDER — HYDROMORPHONE HCL 1 MG/ML IJ SOLN: 0.5000 mg | INTRAMUSCULAR | Status: DC | PRN

## 2024-01-12 MED ORDER — SODIUM CHLORIDE 0.9% FLUSH: 10.0000 mL | INTRAVENOUS | Status: DC | PRN

## 2024-01-12 MED ORDER — POLYETHYLENE GLYCOL 3350 17 GM/SCOOP PO POWD
119.0000 g | Freq: Once | ORAL | Status: AC
  Administered 2024-01-12: 119 g via ORAL
  Filled 2024-01-12: qty 119

## 2024-01-12 MED ORDER — SODIUM CHLORIDE 0.9% FLUSH: 10.0000 mL | Freq: Two times a day (BID) | INTRAVENOUS | Status: DC

## 2024-01-12 NOTE — Progress Notes (Signed)

## 2024-01-12 NOTE — Plan of Care (Signed)

## 2024-01-12 NOTE — Progress Notes (Signed)
 Anne Terrell 9:58 AM  Subjective: Patient unchanged some diarrhea none today little blood in it Case discussed with hospital team no new complaints  Objective: Vital signs stable afebrile no acute distress abdomen has mostly upper discomfort without guarding or rebound BUN and creatinine okay white count okay hemoglobin stable CTA okay unchanged no significant new abnormality  Assessment: Probable ischemic colitis  Plan: Risk benefits methods of flex sig extended to the transverse was discussed with the patient and we will proceed tomorrow at 8:30 AM with further workup and plans pending those findings as an aside my opinion is since she is on chronic narcotics her pain threshold is very low but when left alone she is not screaming or crying or hollering which lowers her overall pain score in my mind and I hope continual reassurance and a discussion of the diagnosis once made hopefully tomorrow will be helpful  [facility]  office [phone] After 5PM or if no answer call [phone]

## 2024-01-12 NOTE — Progress Notes (Signed)
                 Subjective Tearfully continues to endorse significant abdominal pain. Had 5-6 BM's yesterday and noticed blood in initial one.   Physical exam Blood pressure (!) 131/57, pulse 95, temperature 98 F (36.7 C), resp. rate 16, SpO2 96%.  Constitutional: obese, sitting up in bed, in no acute distress Cardiovascular: regular rate and rhythm, no m/r/g Abdominal: normoactive bowel sounds, soft, non-distended, mild TTP in left/right mid abdomen, no guarding  Weight change:   No intake or output data in the 24 hours ending 01/12/24 1059 Net IO Since Admission: 522.27 mL [01/12/24 1059]  Labs, images, and other studies    Latest Ref Rng & Units 01/12/2024    5:24 AM 01/11/2024   11:33 AM 01/10/2024    4:42 AM  CBC  WBC 4.0 - 10.5 K/uL 9.9  12.5  9.6   Hemoglobin 12.0 - 15.0 g/dL 95.2  84.1  32.4   Hematocrit 36.0 - 46.0 % 36.5  35.7  35.3   Platelets 150 - 400 K/uL 187  184  197        Latest Ref Rng & Units 01/12/2024    5:24 AM 01/11/2024   11:33 AM 01/10/2024    4:42 AM  BMP  Glucose 70 - 99 mg/dL 93  401  86   BUN 8 - 23 mg/dL 6  10  14    Creatinine 0.44 - 1.00 mg/dL 0.27  2.53  6.64   Sodium 135 - 145 mmol/L 141  135  138   Potassium 3.5 - 5.1 mmol/L 3.8  3.5  4.3   Chloride 98 - 111 mmol/L 103  102  105   CO2 22 - 32 mmol/L 27  25  24    Calcium 8.9 - 10.3 mg/dL 8.5  8.2  8.2      Assessment and plan Hospital day 3  Anne Terrell is a 83 y.o.female with past medical history of hypertension, chronic back pain, CKD 3A, hyperlipidemia, obesity who presents with abdominal pain, nausea, vomiting, and bloody diarrhea. Admitted for focal colitis.   Focal Colitis GI following, appreciate recommendations. Symptoms continue. C diff and GI stool panel negative. GI planning for flex sig. tomorrow morning. Leading etiology at this point is ischemic colitis. Given persistent abdominal pain and chronic morphine use, will order Dilaudid  0.5 mg q 3 prn. She remains on clear  liquid diet and will be NPO at midnight. -Follow-up flex sig. results -Trend labs   CKD 3A Kidney function at baseline   Chronic Back Pain Neuralgia Continue home Neurontin  and Morphine   HTN BP is stable. Holding Lisinopril.   HLD Continue home Crestor   Aortic Atherosclerosis As noted on CTAP. Crestor per above.  Ronni Colace, DO 01/12/2024, 10:59 AM  Pager: [phone] After 5pm or weekend: [phone]

## 2024-01-12 NOTE — Anesthesia Preprocedure Evaluation (Addendum)
 Anesthesia Evaluation  Patient identified by MRN, date of birth, ID band Patient awake    Reviewed: Allergy & Precautions, NPO status , Patient's Chart, lab work & pertinent test results  History of Anesthesia Complications Negative for: history of anesthetic complications  Airway Mallampati: II  TM Distance: >3 FB Neck ROM: Full   Comment: Previous grade I view with MAC 3, easy mask with OPA Dental  (+) Edentulous Upper, Edentulous Lower, Dental Advisory Given   Pulmonary neg shortness of breath, neg sleep apnea, neg COPD, neg recent URI, former smoker   Pulmonary exam normal breath sounds clear to auscultation       Cardiovascular hypertension (lisinopril), Pt. on medications (-) angina (-) Past MI, (-) Cardiac Stents and (-) CABG (-) dysrhythmias  Rhythm:Regular Rate:Normal  HLD  Low-risk stress test 08/22/2019  Echocardiogram 08/16/2019: Normal LV systolic function with EF 58%. Left ventricle cavity is normal in size. Normal global wall motion. Doppler evidence of grade I (impaired) diastolic dysfunction, normal LAP. Calculated EF 58%. Trileaflet aortic valve.  Mild (Grade I) aortic regurgitation. Structurally normal mitral valve.  Mild (Grade I) mitral regurgitation. Structurally normal tricuspid valve.  Mild tricuspid regurgitation. No evidence of pulmonary hypertension.     Neuro/Psych neg Seizures PSYCHIATRIC DISORDERS  Depression    Spinal cord stimulator removal?  Neuromuscular disease (spondylosis)    GI/Hepatic Neg liver ROS, hiatal hernia, Bowel prep,neg GERD  ,,  Endo/Other  negative endocrine ROS    Renal/GU CRFRenal disease     Musculoskeletal  (+) Arthritis ,    Abdominal  (+) + obese  Peds  Hematology  (+) Blood dyscrasia, anemia Lab Results      Component                Value               Date                      WBC                      9.9                 01/12/2024                HGB                       11.6 (L)            01/12/2024                HCT                      36.5                01/12/2024                MCV                      90.3                01/12/2024                PLT                      187                 01/12/2024  Anesthesia Other Findings Last Wegovy : 01/06/2024  Reproductive/Obstetrics                             Anesthesia Physical Anesthesia Plan  ASA: 3  Anesthesia Plan: MAC   Post-op Pain Management: Minimal or no pain anticipated   Induction: Intravenous  PONV Risk Score and Plan: 2 and Propofol  infusion, TIVA and Treatment may vary due to age or medical condition  Airway Management Planned: Natural Airway and Nasal Cannula  Additional Equipment:   Intra-op Plan:   Post-operative Plan:   Informed Consent: I have reviewed the patients History and Physical, chart, labs and discussed the procedure including the risks, benefits and alternatives for the proposed anesthesia with the patient or authorized representative who has indicated his/her understanding and acceptance.     Dental advisory given  Plan Discussed with: CRNA and Anesthesiologist  Anesthesia Plan Comments: (Discussed with patient risks of MAC including, but not limited to, minor pain or discomfort, hearing people in the room, and possible need for backup general anesthesia. Risks for general anesthesia also discussed including, but not limited to, sore throat, hoarse voice, chipped/damaged teeth, injury to vocal cords, nausea and vomiting, allergic reactions, lung infection, heart attack, stroke, and death. All questions answered. )       Anesthesia Quick Evaluation

## 2024-01-13 ENCOUNTER — Inpatient Hospital Stay (HOSPITAL_COMMUNITY): Payer: Self-pay

## 2024-01-13 ENCOUNTER — Encounter (HOSPITAL_COMMUNITY)
Admission: EM | Disposition: A | Discharge status: Home / Self Care | Payer: Self-pay | Source: Home / Self Care | Attending: Internal Medicine

## 2024-01-13 ENCOUNTER — Encounter (HOSPITAL_COMMUNITY): Payer: Self-pay | Admitting: Student

## 2024-01-13 DIAGNOSIS — K633 Ulcer of intestine: Secondary | ICD-10-CM

## 2024-01-13 DIAGNOSIS — K6389 Other specified diseases of intestine: Secondary | ICD-10-CM

## 2024-01-13 DIAGNOSIS — K529 Noninfective gastroenteritis and colitis, unspecified: Secondary | ICD-10-CM

## 2024-01-13 HISTORY — PX: BIOPSY OF SKIN SUBCUTANEOUS TISSUE AND/OR MUCOUS MEMBRANE: SHX6741

## 2024-01-13 HISTORY — PX: COLONOSCOPY: SHX5424

## 2024-01-13 LAB — CBC
HCT: 36.4 % (ref 36.0–46.0)
Hemoglobin: 11.4 g/dL — ABNORMAL LOW (ref 12.0–15.0)
MCH: 28.6 pg (ref 26.0–34.0)
MCHC: 31.3 g/dL (ref 30.0–36.0)
MCV: 91.5 fL (ref 80.0–100.0)
Platelets: 230 10*3/uL (ref 150–400)
RBC: 3.98 MIL/uL (ref 3.87–5.11)
RDW: 12.1 % (ref 11.5–15.5)
WBC: 9.2 10*3/uL (ref 4.0–10.5)
nRBC: 0 % (ref 0.0–0.2)

## 2024-01-13 LAB — BASIC METABOLIC PANEL WITH GFR
Anion gap: 8 (ref 5–15)
BUN: <5 mg/dL — ABNORMAL LOW (ref 8–23)
CO2: 28 mmol/L (ref 22–32)
Calcium: 8.3 mg/dL — ABNORMAL LOW (ref 8.9–10.3)
Chloride: 103 mmol/L (ref 98–111)
Creatinine, Ser: 1.19 mg/dL — ABNORMAL HIGH (ref 0.44–1.00)
GFR, Estimated: 46 mL/min — ABNORMAL LOW (ref >60)
Glucose, Bld: 93 mg/dL (ref 70–99)
Potassium: 3.8 mmol/L (ref 3.5–5.1)
Sodium: 139 mmol/L (ref 135–145)

## 2024-01-13 SURGERY — COLONOSCOPY
Anesthesia: Monitor Anesthesia Care

## 2024-01-13 MED ORDER — PROPOFOL 10 MG/ML IV BOLUS
INTRAVENOUS | Status: DC | PRN
  Administered 2024-01-13: 40 mg via INTRAVENOUS
  Administered 2024-01-13: 30 mg via INTRAVENOUS

## 2024-01-13 MED ORDER — GLYCOPYRROLATE PF 0.2 MG/ML IJ SOSY
PREFILLED_SYRINGE | INTRAMUSCULAR | Status: DC | PRN
  Administered 2024-01-13 (×2): .1 mg via INTRAVENOUS

## 2024-01-13 MED ORDER — PHENYLEPHRINE HCL (PRESSORS) 10 MG/ML IV SOLN
INTRAVENOUS | Status: DC | PRN
  Administered 2024-01-13 (×2): 80 ug via INTRAVENOUS

## 2024-01-13 MED ORDER — SODIUM CHLORIDE 0.9 % IV SOLN: INTRAVENOUS | Status: DC | PRN

## 2024-01-13 MED ORDER — PROPOFOL 500 MG/50ML IV EMUL
INTRAVENOUS | Status: DC | PRN
  Administered 2024-01-13: 80 ug/kg/min via INTRAVENOUS

## 2024-01-13 MED ORDER — LIDOCAINE 2% (20 MG/ML) 5 ML SYRINGE
INTRAMUSCULAR | Status: DC | PRN
  Administered 2024-01-13: 40 mg via INTRAVENOUS

## 2024-01-13 MED ORDER — HYDROCORTISONE 0.5 % EX CREA
TOPICAL_CREAM | CUTANEOUS | Status: DC | PRN
  Filled 2024-01-13: qty 28.35

## 2024-01-13 MED ORDER — POLYETHYLENE GLYCOL 3350 17 G PO PACK
8.0000 g | PACK | Freq: Every day | ORAL | Status: DC
  Administered 2024-01-13: 8 g via ORAL
  Filled 2024-01-13 (×2): qty 1

## 2024-01-13 NOTE — Op Note (Signed)
 [facility] Patient Name: Anne Terrell Procedure Date : 01/13/2024 MRN: [id number] Attending MD: Ozell Blunt , MD, [id number] Date of Birth: 22-Feb-1941 CSN: [id number] Age: 83 Admit Type: Inpatient Procedure:                Colonoscopy Indications:              Abnormal CT of the GI tract, Suspected acute                            ischemic colitis Providers:                Ozell Blunt, MD, Jacquelyn "Jaci" Bernetta Brilliant, RN, Nohemi Batters, Technician Referring MD:              Medicines:                Monitored Anesthesia Care Complications:            No immediate complications. Estimated Blood Loss:     Estimated blood loss: none. Procedure:                Pre-Anesthesia Assessment:                           - Prior to the procedure, a History and Physical                            was performed, and patient medications and                            allergies were reviewed. The patient's tolerance of                            previous anesthesia was also reviewed. The risks                            and benefits of the procedure and the sedation                            options and risks were discussed with the patient.                            All questions were answered, and informed consent                            was obtained. Prior Anticoagulants: The patient has                            taken no anticoagulant or antiplatelet agents. ASA                            Grade Assessment: III - A patient with severe  systemic disease. After reviewing the risks and                            benefits, the patient was deemed in satisfactory                            condition to undergo the procedure.                           After obtaining informed consent, the colonoscope                            was passed under direct vision. Throughout the                            procedure, the patient's blood pressure,  pulse, and                            oxygen saturations were monitored continuously. The                            PCF-190TL ([id number]) Olympus colonoscope was                            introduced through the anus and advanced to the the                            splenic flexure for evaluation. This was the                            intended extent. The colonoscopy was performed                            without difficulty. The patient tolerated the                            procedure well. The quality of the bowel                            preparation was adequate. The rectum was                            photographed. Scope In: 8:50:43 AM Scope Out: 9:01:05 AM Total Procedure Duration: 0 hours 10 minutes 22 seconds  Findings:      External and internal hemorrhoids were found during retroflexion, during       perianal exam and during digital exam. The hemorrhoids were small.      A localized area of severely altered vascular, congested, erythematous,       eroded (linear-pattern), furrowed, inflamed, ulcerated and       vascular-pattern-decreased mucosa was found at the splenic flexure.       Biopsies were taken with a cold forceps for histology. We elected not to       try to advance any further      The exam was otherwise without abnormality  on direct and retroflexion       views. Impression:               - External and internal hemorrhoids.                           - Altered vascular, congested, erythematous, eroded                            (linear-pattern), furrowed, inflamed, ulcerated and                            vascular-pattern-decreased mucosa at the splenic                            flexure. Biopsied.                           - The examination was otherwise normal on direct                            and retroflexion views. Recommendation:           - Full liquid diet today. Very slowly advance diet                            based on degree of inflammation                            - Continue present medications. Would use 1/2-1                            dose of MiraLAX  every day to prevent constipation                            and keep bowels moving through the area                           - Await pathology results.                           - Return to GI clinic PRN. And please call us  back                            if we can be of any further assistance with this                            hospital stay                           - Telephone GI clinic if symptomatic PRN.                           - Telephone GI clinic for pathology results in 1  week. Procedure Code(s):        --- Professional ---                           [phone], 52, Colonoscopy, flexible; with biopsy,                            single or multiple Diagnosis Code(s):        --- Professional ---                           K52.9, Noninfective gastroenteritis and colitis,                            unspecified                           K63.3, Ulcer of intestine                           K63.89, Other specified diseases of intestine                           R93.3, Abnormal findings on diagnostic imaging of                            other parts of digestive tract CPT copyright 2022 [employer]. All rights reserved. The codes documented in this report are preliminary and upon coder review may  be revised to meet current compliance requirements. Ozell Blunt, MD 01/13/2024 9:24:49 AM This report has been signed electronically. Number of Addenda: 0

## 2024-01-13 NOTE — Progress Notes (Signed)
 Anne Terrell 8:42 AM  Subjective: Patient doing better with less pain and no bleeding and did okay with the prep and has no new complaints and we rediscussed her procedure  Objective: Vital signs stable afebrile no acute distress exam please see preassessment evaluation labs stable  Assessment: Probably ischemic colitis  Plan: Get a proceed with extended flex sig to confirm diagnosis with anesthesia assistance  [facility]  office [phone] After 5PM or if no answer call [phone]

## 2024-01-13 NOTE — Plan of Care (Signed)

## 2024-01-13 NOTE — Progress Notes (Addendum)
                 Subjective Feels much better. Abdominal pain improved, no more blood in stool.   Physical exam Blood pressure (!) 113/51, pulse 74, temperature 98 F (36.7 C), temperature source Temporal, resp. rate 12, height 5\' 3"  (1.6 m), weight 77.1 kg, SpO2 93%.  Constitutional: obese, lying in bed, no acute distress Cardiovascular: regular rate and rhythm, no m/r/g Abdominal: normoactive bowel sounds, distended but soft, mild diffuse TTP, no guarding  Weight change:    Intake/Output Summary (Last 24 hours) at 01/13/2024 0752 Last data filed at 01/12/2024 1600 Gross per 24 hour  Intake 946 ml  Output --  Net 946 ml   Net IO Since Admission: 1,468.27 mL [01/13/24 0752]  Labs, images, and other studies    Latest Ref Rng & Units 01/13/2024    3:42 AM 01/12/2024    5:24 AM 01/11/2024   11:33 AM  CBC  WBC 4.0 - 10.5 K/uL 9.2  9.9  12.5   Hemoglobin 12.0 - 15.0 g/dL 16.1  09.6  04.5   Hematocrit 36.0 - 46.0 % 36.4  36.5  35.7   Platelets 150 - 400 K/uL 230  187  184        Latest Ref Rng & Units 01/13/2024    3:42 AM 01/12/2024    5:24 AM 01/11/2024   11:33 AM  BMP  Glucose 70 - 99 mg/dL 93  93  409   BUN 8 - 23 mg/dL 5  6  10    Creatinine 0.44 - 1.00 mg/dL 8.11  9.14  7.82   Sodium 135 - 145 mmol/L 139  141  135   Potassium 3.5 - 5.1 mmol/L 3.8  3.8  3.5   Chloride 98 - 111 mmol/L 103  103  102   CO2 22 - 32 mmol/L 28  27  25    Calcium  8.9 - 10.3 mg/dL 8.3  8.5  8.2      Assessment and plan Hospital day 4  Anne Terrell is a 83 y.o.female with past medical history of hypertension, chronic back pain, CKD 3A, hyperlipidemia, obesity who presents with abdominal pain, nausea, vomiting, and bloody diarrhea. Admitted for focal colitis.   Focal Colitis, suspected ischemic Per GI, extended flex sig. today with findings consistent with ischemic colitis. Biopsies taken as well. Etiology is not exactly clear but thankfully her symptoms are markedly improved. Will provide Miralax   to ease stool transit and diet advanced to full liquid. Will continue to advance as tolerated. -CTM vitals/labs -Follow-up biopsy results   CKD 3A Kidney function at or ~ baseline   Chronic Back Pain Neuralgia Continue home Neurontin  and Morphine    HTN BP is stable. Holding Lisinopril.    HLD Continue home Crestor    Aortic Atherosclerosis As noted on CTAP. Crestor  per above.   Ronni Colace, DO 01/13/2024, 7:52 AM  Pager: [phone] After 5pm or weekend: [phone]

## 2024-01-13 NOTE — Anesthesia Postprocedure Evaluation (Signed)
 Anesthesia Post Note  Patient: Anne Terrell  Procedure(s) Performed: COLONOSCOPY BIOPSY, SKIN, SUBCUTANEOUS TISSUE, OR MUCOUS MEMBRANE     Patient location during evaluation: PACU Anesthesia Type: MAC Level of consciousness: awake Pain management: pain level controlled Vital Signs Assessment: post-procedure vital signs reviewed and stable Respiratory status: spontaneous breathing, nonlabored ventilation and respiratory function stable Cardiovascular status: stable and blood pressure returned to baseline Postop Assessment: no apparent nausea or vomiting Anesthetic complications: no   No notable events documented.  Last Vitals:  Vitals:   01/13/24 0920 01/13/24 0930  BP: (!) 110/56 129/72  Pulse: 79 85  Resp: 13 15  Temp:    SpO2: 90% 97%    Last Pain:  Vitals:   01/13/24 0930  TempSrc:   PainSc: 0-No pain                 Conard Decent

## 2024-01-13 NOTE — Transfer of Care (Signed)
 Immediate Anesthesia Transfer of Care Note  Patient: Anne Terrell  Procedure(s) Performed: COLONOSCOPY BIOPSY, SKIN, SUBCUTANEOUS TISSUE, OR MUCOUS MEMBRANE  Patient Location: PACU  Anesthesia Type:MAC  Level of Consciousness: drowsy  Airway & Oxygen Therapy: Patient Spontanous Breathing  Post-op Assessment: Report given to RN and Post -op Vital signs reviewed and stable  Post vital signs: Reviewed and stable  Last Vitals:  Vitals Value Taken Time  BP    Temp    Pulse    Resp    SpO2      Last Pain:  Vitals:   01/13/24 0747  TempSrc: Temporal  PainSc: 2          Complications: No notable events documented.

## 2024-01-14 DIAGNOSIS — K922 Gastrointestinal hemorrhage, unspecified: Secondary | ICD-10-CM | POA: Diagnosis not present

## 2024-01-14 DIAGNOSIS — R112 Nausea with vomiting, unspecified: Secondary | ICD-10-CM | POA: Diagnosis not present

## 2024-01-14 DIAGNOSIS — K559 Vascular disorder of intestine, unspecified: Secondary | ICD-10-CM | POA: Diagnosis not present

## 2024-01-14 LAB — BASIC METABOLIC PANEL WITH GFR
Anion gap: 8 (ref 5–15)
BUN: <5 mg/dL — ABNORMAL LOW (ref 8–23)
CO2: 28 mmol/L (ref 22–32)
Calcium: 8.7 mg/dL — ABNORMAL LOW (ref 8.9–10.3)
Chloride: 104 mmol/L (ref 98–111)
Creatinine, Ser: 1.25 mg/dL — ABNORMAL HIGH (ref 0.44–1.00)
GFR, Estimated: 43 mL/min — ABNORMAL LOW (ref >60)
Glucose, Bld: 103 mg/dL — ABNORMAL HIGH (ref 70–99)
Potassium: 4.2 mmol/L (ref 3.5–5.1)
Sodium: 140 mmol/L (ref 135–145)

## 2024-01-14 LAB — CBC
HCT: 36.6 % (ref 36.0–46.0)
Hemoglobin: 11.5 g/dL — ABNORMAL LOW (ref 12.0–15.0)
MCH: 28.8 pg (ref 26.0–34.0)
MCHC: 31.4 g/dL (ref 30.0–36.0)
MCV: 91.5 fL (ref 80.0–100.0)
Platelets: 212 10*3/uL (ref 150–400)
RBC: 4 MIL/uL (ref 3.87–5.11)
RDW: 12.3 % (ref 11.5–15.5)
WBC: 6.4 10*3/uL (ref 4.0–10.5)
nRBC: 0 % (ref 0.0–0.2)

## 2024-01-14 NOTE — Plan of Care (Signed)

## 2024-01-14 NOTE — Discharge Summary (Signed)
 Name: Anne Terrell MRN: [id number] DOB: 05/22/1941 83 y.o. PCP: Claud Crumb, MD  Date of Admission: 01/09/2024 12:16 PM Date of Discharge:  01/14/2024 Attending Physician: Dr. Jarvis Mesa  DISCHARGE DIAGNOSIS:  Primary Problem: Colitis   Hospital Problems: Principal Problem:   Colitis Active Problems:   Gastrointestinal hemorrhage   Nausea vomiting and diarrhea    DISCHARGE MEDICATIONS:   Allergies as of 01/14/2024       Reactions   Penicillins Rash   Did it involve swelling of the face/tongue/throat, SOB, or low BP? No Did it involve sudden or severe rash/hives, skin peeling, or any reaction on the inside of your mouth or nose? No Did you need to seek medical attention at a hospital or doctor's office? No When did it last happen?      30 + years If all above answers are "NO", may proceed with cephalosporin use.   Shellfish Allergy Nausea And Vomiting, Other (See Comments)   Esophageal spasms and vomiting per pt   Aspirin Other (See Comments)   "Stomach pain"        Medication List     PAUSE taking these medications    lisinopril 10 MG tablet Wait to take this until your doctor or other care provider tells you to start again. Commonly known as: ZESTRIL Take 10 mg by mouth daily.   Wegovy  0.5 MG/0.5ML Soaj Wait to take this until your doctor or other care provider tells you to start again. Generic drug: Semaglutide -Weight Management Inject 0.5 mg into the skin once a week.       TAKE these medications    gabapentin  400 MG capsule Commonly known as: NEURONTIN  Take 400 mg by mouth 4 (four) times daily.   morphine  15 MG tablet Commonly known as: MSIR Take 15 mg by mouth 4 (four) times daily.   ondansetron  4 MG tablet Commonly known as: Zofran  Take 1 tablet (4 mg total) by mouth every 8 (eight) hours as needed for nausea or vomiting.   rosuvastatin  10 MG tablet Commonly known as: CRESTOR  Take 10 mg by mouth daily.        DISPOSITION AND  FOLLOW-UP:  Anne Terrell was discharged from [facility] in Stable condition. At the hospital follow up visit please address:  Focal Colitis HTN Obesity Ensure continued resolution of symptoms and assess need for Lisinopril and Wegovy  as this could have been causal factor in the absence of any other clear etiology.  HOSPITAL COURSE:  Patient Summary:  Focal Colitis Presented with new sudden-onset bloody diarrhea and significant 10/10 abdominal pain that began morning of admission. CTAP showed 20 cm segment of left transverse colitis. C. difficile and GI pathogen panel were negative. GI recommended extended sigmoidoscopy which revealed findings consistent with ischemic colitis. Diet was initially clear liquid but was advanced without complication. She denied any symptoms in days leading up to discharge.  The exact cause is unknown but leading theory is hypoperfusion 2/2 to lisinopril in the setting of reduced po intake after starting Wegovy . Of note, there have been some case reports of isolated ischemic colitis associated with Wegovy . Her Lisinopril was held during admission and blood pressure remained normotensive. She was instructed to hold Lisinopril and Wegovy  until following-up with PCP.     DISCHARGE INSTRUCTIONS:   Discharge Instructions     Call MD for:  difficulty breathing, headache or visual disturbances   Complete by: As directed    Call MD for:  extreme fatigue   Complete  by: As directed    Call MD for:  hives   Complete by: As directed    Call MD for:  persistant dizziness or light-headedness   Complete by: As directed    Call MD for:  persistant nausea and vomiting   Complete by: As directed    Call MD for:  redness, tenderness, or signs of infection (pain, swelling, redness, odor or green/yellow discharge around incision site)   Complete by: As directed    Call MD for:  severe uncontrolled pain   Complete by: As directed    Call MD for:   temperature >100.4   Complete by: As directed    Discharge instructions   Complete by: As directed    You were admitted to the hospital for abdominal pain and diarrhea with blood. We believe this was caused by reduced blood flow to your intestines. The exact cause of this is not known, but it is possible that your medication Lisinopril and Wegovy  could have been contributory. For this reason, we are advising you to not take these medications until you follow-up with your primary care provider. Also please ensure your are having regular bowel movements as it will also be helpful to prevent constipation. Please call and make an appointment with your PCP ASAP.   Increase activity slowly   Complete by: As directed        SUBJECTIVE:  Denies abdominal pain/diarrhea. No signs of blood for the past few days.Comfortable with discharge Discharge Vitals:   BP 101/60 (BP Location: Left Arm)   Pulse 73   Temp 98.5 F (36.9 C) (Oral)   Resp 17   Ht 5\' 3"  (1.6 m)   Wt 77.1 kg   SpO2 93%   BMI 30.11 kg/m   OBJECTIVE:  Constitutional: obese, sitting up in bed, no acute distress Cardiovascular: regular rate and rhythm, no m/r/g Abdominal: normoactive bowel sounds, no TTP  Pertinent Labs, Studies, and Procedures:     Latest Ref Rng & Units 01/14/2024    5:30 AM 01/13/2024    3:42 AM 01/12/2024    5:24 AM  CBC  WBC 4.0 - 10.5 K/uL 6.4  9.2  9.9   Hemoglobin 12.0 - 15.0 g/dL 16.1  09.6  04.5   Hematocrit 36.0 - 46.0 % 36.6  36.4  36.5   Platelets 150 - 400 K/uL 212  230  187        Latest Ref Rng & Units 01/14/2024    5:30 AM 01/13/2024    3:42 AM 01/12/2024    5:24 AM  CMP  Glucose 70 - 99 mg/dL 409  93  93   BUN 8 - 23 mg/dL <5  <5  6   Creatinine 0.44 - 1.00 mg/dL 8.11  9.14  7.82   Sodium 135 - 145 mmol/L 140  139  141   Potassium 3.5 - 5.1 mmol/L 4.2  3.8  3.8   Chloride 98 - 111 mmol/L 104  103  103   CO2 22 - 32 mmol/L 28  28  27    Calcium  8.9 - 10.3 mg/dL 8.7  8.3  8.5     DG Abd  1 View Result Date: 01/10/2024 CLINICAL DATA:  Upright abdomen to evaluate for free air. Abdominal pain. EXAM: ABDOMEN - 1 VIEW COMPARISON:  01/10/2024, 1:02 p.m. FINDINGS: The bowel gas pattern is normal. No free intraperitoneal air. No radio-opaque calculi or other significant radiographic abnormality are seen. IMPRESSION: Negative. Electronically Signed   By: Melodie Spry  Pleasure M.D.   On: 01/10/2024 20:57   DG Abd 1 View Result Date: 01/10/2024 CLINICAL DATA:  Abdominal pain. EXAM: ABDOMEN - 1 VIEW COMPARISON:  March 31, 2021. FINDINGS: The bowel gas pattern is normal. No radio-opaque calculi or other significant radiographic abnormality are seen. IMPRESSION: No abnormal bowel dilatation. Electronically Signed   By: Rosalene Colon M.D.   On: 01/10/2024 13:33   CT ABDOMEN PELVIS W CONTRAST Result Date: 01/09/2024 CLINICAL DATA:  Abdominal pain, acute, nonlocalized. EXAM: CT ABDOMEN AND PELVIS WITH CONTRAST TECHNIQUE: Multidetector CT imaging of the abdomen and pelvis was performed using the standard protocol following bolus administration of intravenous contrast. RADIATION DOSE REDUCTION: This exam was performed according to the departmental dose-optimization program which includes automated exposure control, adjustment of the mA and/or kV according to patient size and/or use of iterative reconstruction technique. CONTRAST:  75mL OMNIPAQUE  IOHEXOL  350 MG/ML SOLN COMPARISON:  None Available. FINDINGS: Lower chest: There are patchy atelectatic changes in the visualized lung bases. No overt consolidation. No pleural effusion. The heart is normal in size. No pericardial effusion. Hepatobiliary: The liver is normal in size. Non-cirrhotic configuration. No suspicious mass. These is mild diffuse hepatic steatosis. No intrahepatic or extrahepatic bile duct dilation. No calcified gallstones. Normal gallbladder wall thickness. No pericholecystic inflammatory changes. Pancreas: Unremarkable. No pancreatic ductal dilatation  or surrounding inflammatory changes. Spleen: Within normal limits. No focal lesion. Adrenals/Urinary Tract: Adrenal glands are unremarkable. No suspicious renal mass. No hydronephrosis. No renal or ureteric calculi. Urine bladder is only partially evaluated due to streak artifacts from right hip arthroplasty hardware. Stomach/Bowel: There is an approximately 20 cm long segment of left transverse colon exhibiting moderate circumferential wall thickening with mild to moderate pericolonic fat stranding, compatible with focal colitis. There is no associated abscess or collection. No pneumatosis, pneumoperitoneum or portal venous gas. Rest of the bowel loops are otherwise unremarkable. No disproportionate dilation of the small or large bowel loops. The appendix was not visualized; however there is no acute inflammatory process in the right lower quadrant. Vascular/Lymphatic: No abdominal or pelvic lymphadenopathy, by size criteria. No aneurysmal dilation of the major abdominal arteries. There are moderate peripheral atherosclerotic vascular calcifications of the aorta and its major branches. Reproductive: Evaluation is limited due to streak artifacts from right hip arthroplasty. Uterus is likely surgically absent. No adnexal mass seen. Other: There is a tiny fat containing umbilical hernia. The soft tissues and abdominal wall are otherwise unremarkable. Musculoskeletal: No suspicious osseous lesions. There are mild multilevel degenerative changes in the visualized spine. IMPRESSION: 1. There is an approximately 20 cm long segment of left transverse colon exhibiting moderate circumferential wall thickening with mild to moderate pericolonic fat stranding, compatible with focal colitis. No associated abscess or collection. Colonoscopy is recommended after the acute episode subsides, unless recently performed, to exclude underlying neoplastic process. 2. Multiple other nonacute observations, as described above. Aortic  Atherosclerosis (ICD10-I70.0). Electronically Signed   By: Beula Brunswick M.D.   On: 01/09/2024 14:45     Signed: Ronni Colace, DO  Internal Medicine Resident, PGY-1 [facility]  Pager: [phone] 11:20 AM, 01/14/2024

## 2024-01-14 NOTE — Progress Notes (Signed)
 Pt discharged with belongings and AVS via wheelchair by discharge RN. All questions were answered and AVS reviewed with pt by this RN.

## 2024-01-15 ENCOUNTER — Encounter (HOSPITAL_COMMUNITY): Payer: Self-pay | Admitting: Gastroenterology

## 2024-01-16 LAB — SURGICAL PATHOLOGY

## 2024-01-18 ENCOUNTER — Ambulatory Visit: Payer: Self-pay | Admitting: Internal Medicine

## 2024-02-21 ENCOUNTER — Ambulatory Visit (INDEPENDENT_AMBULATORY_CARE_PROVIDER_SITE_OTHER): Admitting: Orthopaedic Surgery

## 2024-02-21 DIAGNOSIS — M65341 Trigger finger, right ring finger: Secondary | ICD-10-CM | POA: Diagnosis not present

## 2024-02-21 NOTE — Progress Notes (Signed)
 Office Visit Note   Patient: Anne Terrell           Date of Birth: Jul 17, 1941           MRN: [id number] Visit Date: 02/21/2024              Requested by: Claud Crumb, MD [address] [place],  [place] [address] PCP: Alfonso Angles Conway Dennis, MD   Assessment & Plan: Visit Diagnoses:  1. Trigger finger, right ring finger     Plan: History of Present Illness Anne Terrell is an 83 year old female who presents with right ring trigger finger.  She experiences persistent pain in the right ring finger, interfering with daily activities, particularly driving. She has not received any injections in the right ring finger. She previously underwent surgery for trigger finger in her left hand due to ineffective injections.  Exam of the right ring finger shows tender nodule at the A1 pulley.  Full arc of motion with pain.  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 Previous surgery on the left hand. Stephenie Einstein will contact her to confirm surgery date. - Detailed surgical plan discussed.  Follow-Up Instructions: No follow-ups on file.   Orders:  No orders of the defined types were placed in this encounter.  No orders of the defined types were placed in this encounter.     Procedures: No procedures performed   Clinical Data: No additional findings.   Subjective: Chief Complaint  Patient presents with   Other    Right ring finger pain, triggering and locking-wants to discuss surgery    HPI  Review of Systems  Constitutional: Negative.   HENT: Negative.    Eyes: Negative.   Respiratory: Negative.    Cardiovascular: Negative.   Endocrine: Negative.   Musculoskeletal: Negative.   Neurological: Negative.   Hematological: Negative.   Psychiatric/Behavioral: Negative.    All other systems reviewed and are negative.    Objective: Vital Signs:  There were no vitals taken for this visit.  Physical Exam Vitals and nursing note reviewed.  Constitutional:      Appearance: She is well-developed.  HENT:     Head: Atraumatic.     Nose: Nose normal.   Eyes:     Extraocular Movements: Extraocular movements intact.    Cardiovascular:     Pulses: Normal pulses.  Pulmonary:     Effort: Pulmonary effort is normal.  Abdominal:     Palpations: Abdomen is soft.   Musculoskeletal:     Cervical back: Neck supple.   Skin:    General: Skin is warm.     Capillary Refill: Capillary refill takes less than 2 seconds.   Neurological:     Mental Status: She is alert. Mental status is at baseline.   Psychiatric:        Behavior: Behavior normal.        Thought Content: Thought content normal.        Judgment: Judgment normal.     Ortho Exam  Specialty Comments:  ADDENDUM REPORT: 06/06/2020 12:25   ADDENDUM: Lumbar spine report was linked to the same report.   Segmentation: 5 non rib-bearing lumbar type vertebral bodies are present. The lowest fully formed vertebral body is L5.   Alignment: Slight anterolisthesis present at L3-4 and L4-5.   Vertebral bodies: Superior endplate Schmorl's node is present at L3. Schmorl's node at  T11-12 has associated chronic fatty endplate marrow changes. Mild chronic fatty endplate marrow changes are present at L5-S1.   Conus medullaris and distal thoracic spinal cord are within normal limits. The tip is at L2.   Limited imaging the abdomen is unremarkable. There is no significant adenopathy. No solid organ lesions are present.   Disc levels: L1-2: Negative.   L2-3: Mild disc bulging is present. Facet hypertrophy is present on the right. No significant stenosis is present.   L3-4: A mild broad-based disc protrusion is present. Facet hypertrophy is worse on the right. Mild foraminal narrowing is present bilaterally. The central canal is patent.   L4-5: Uncovering of a broad-based  disc protrusion is present. Moderate facet hypertrophy is noted bilaterally. Moderate right and mild left foraminal stenosis is present.   L5-S1: Chronic thinning of the disc space is present. A mild broad-based protrusion is present. Mild bilateral foraminal stenosis is present.   Impressions:   1. Mild foraminal narrowing bilaterally at L3-4 secondary to disc bulging and facet hypertrophy. 2. Moderate right and mild left foraminal stenosis at L4-5 due to a broad-based disc protrusion and asymmetric right-sided facet hypertrophy. 3. Chronic thinning of the disc with mild bilateral foraminal narrowing at L5-S1. 4. Slight anterolisthesis at L3-4 and L4-5.     Electronically Signed   By: Audree Leas M.D.   On: 06/06/2020 12:25  Imaging: No results found.   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   Varicose veins of bilateral lower extremities with other complications 05/03/2014   Major depressive disorder, recurrent episode (HCC) 04/11/2013   Major depressive disorder, single episode, moderate (HCC) 01/05/2013   Severe episode of recurrent major depressive disorder (HCC) 11/15/2012   Past Medical History:  Diagnosis Date   Arthralgia    Carpal tunnel syndrome    DDD (degenerative disc disease), cervical    DDD (degenerative disc disease), lumbar    Full dentures    History of hiatal hernia    Hypertension     Myalgia    Myofascial pain    Narcotic dependence (HCC)    Osteoarthritis    Primary localized osteoarthritis of right hip    Right rotator cuff tear    Spondylosis    Varicose veins    Wears glasses     Family History  Problem Relation Age of Onset   Diabetes Mother    Varicose Veins Mother    Asthma Mother    Heart defect Sister     Past Surgical History:  Procedure Laterality Date   ABDOMINAL HYSTERECTOMY  1988   APPENDECTOMY     BIOPSY OF SKIN SUBCUTANEOUS TISSUE AND/OR MUCOUS MEMBRANE  01/13/2024   Procedure: BIOPSY, SKIN, SUBCUTANEOUS TISSUE, OR MUCOUS MEMBRANE;  Surgeon: Ozell Blunt, MD;  Location: [facility] ENDOSCOPY;  Service: Gastroenterology;;   Ritta Chessman RELEASE Bilateral    COLONOSCOPY N/A 01/13/2024   Procedure: COLONOSCOPY;  Surgeon: Ozell Blunt, MD;  Location: [facility] ENDOSCOPY;  Service: Gastroenterology;  Laterality:  N/A;   COLONOSCOPY WITH ESOPHAGOGASTRODUODENOSCOPY (EGD)     dental posts     holds bottom dentures in place   DILATION AND CURETTAGE OF UTERUS     FRACTURE SURGERY Left    LLE   MULTIPLE TOOTH EXTRACTIONS     SPINAL CORD STIMULATOR INSERTION     located near the right hip and waist area   SPINAL CORD STIMULATOR REMOVAL N/A 09/21/2019   Procedure: LUMBAR SPINAL CORD STIMULATOR REMOVAL;  Surgeon: Gerri Kras, MD;  Location: [facility];  Service: Neurosurgery;  Laterality: N/A;  LUMBAR SPINAL CORD STIMULATOR REMOVAL   TONSILLECTOMY     TOTAL HIP ARTHROPLASTY Right 03/20/2018   Procedure: RIGHT TOTAL HIP ARTHROPLASTY ANTERIOR APPROACH;  Surgeon: Wes Hamman, MD;  Location: [facility] ORS;  Service: Orthopedics;  Laterality: Right;   TRIGGER FINGER RELEASE Left 05/25/2023   Procedure: LEFT MIDDLE TRIGGER FINGER RELEASE;  Surgeon: Wes Hamman, MD;  Location: [facility];  Service: Orthopedics;  Laterality: Left;   Social History   Occupational History   Not on file  Tobacco Use   Smoking status: Former    Current packs/day: 0.00    Average  packs/day: 1 pack/day for 30.0 years (30.0 ttl pk-yrs)    Types: Cigarettes    Start date: 05/03/1974    Quit date: 05/03/2004    Years since quitting: 19.8   Smokeless tobacco: Never  Vaping Use   Vaping status: Never Used  Substance and Sexual Activity   Alcohol  use: No    Alcohol /week: 0.0 standard drinks of alcohol    Drug use: No   Sexual activity: Not Currently

## 2024-02-22 ENCOUNTER — Other Ambulatory Visit (HOSPITAL_COMMUNITY): Payer: Self-pay

## 2024-02-29 ENCOUNTER — Encounter (HOSPITAL_BASED_OUTPATIENT_CLINIC_OR_DEPARTMENT_OTHER): Payer: Self-pay | Admitting: Orthopaedic Surgery

## 2024-02-29 ENCOUNTER — Other Ambulatory Visit: Payer: Self-pay

## 2024-03-01 ENCOUNTER — Other Ambulatory Visit: Payer: Self-pay | Admitting: Physician Assistant

## 2024-03-01 MED ORDER — ONDANSETRON HCL 4 MG PO TABS: 4.0000 mg | ORAL_TABLET | Freq: Three times a day (TID) | ORAL | 0 refills | Status: DC | PRN

## 2024-03-01 MED ORDER — HYDROCODONE-ACETAMINOPHEN 5-325 MG PO TABS: 1.0000 | ORAL_TABLET | Freq: Three times a day (TID) | ORAL | 0 refills | Status: DC | PRN

## 2024-03-07 ENCOUNTER — Ambulatory Visit (HOSPITAL_BASED_OUTPATIENT_CLINIC_OR_DEPARTMENT_OTHER)
Admission: RE | Admit: 2024-03-07 | Discharge: 2024-03-07 | Disposition: A | Discharge status: Home / Self Care | Attending: Orthopaedic Surgery | Admitting: Orthopaedic Surgery

## 2024-03-07 ENCOUNTER — Encounter (HOSPITAL_BASED_OUTPATIENT_CLINIC_OR_DEPARTMENT_OTHER)
Admission: RE | Disposition: A | Discharge status: Home / Self Care | Payer: Self-pay | Source: Home / Self Care | Attending: Orthopaedic Surgery

## 2024-03-07 ENCOUNTER — Other Ambulatory Visit: Payer: Self-pay

## 2024-03-07 ENCOUNTER — Ambulatory Visit (HOSPITAL_BASED_OUTPATIENT_CLINIC_OR_DEPARTMENT_OTHER): Admitting: Anesthesiology

## 2024-03-07 ENCOUNTER — Encounter (HOSPITAL_BASED_OUTPATIENT_CLINIC_OR_DEPARTMENT_OTHER): Payer: Self-pay | Admitting: Orthopaedic Surgery

## 2024-03-07 ENCOUNTER — Other Ambulatory Visit: Payer: Self-pay | Admitting: Physician Assistant

## 2024-03-07 DIAGNOSIS — Z87891 Personal history of nicotine dependence: Secondary | ICD-10-CM | POA: Insufficient documentation

## 2024-03-07 DIAGNOSIS — F32A Depression, unspecified: Secondary | ICD-10-CM | POA: Insufficient documentation

## 2024-03-07 DIAGNOSIS — M65341 Trigger finger, right ring finger: Secondary | ICD-10-CM | POA: Diagnosis present

## 2024-03-07 DIAGNOSIS — M65841 Other synovitis and tenosynovitis, right hand: Secondary | ICD-10-CM | POA: Insufficient documentation

## 2024-03-07 DIAGNOSIS — M65941 Unspecified synovitis and tenosynovitis, right hand: Secondary | ICD-10-CM

## 2024-03-07 DIAGNOSIS — G8929 Other chronic pain: Secondary | ICD-10-CM | POA: Insufficient documentation

## 2024-03-07 DIAGNOSIS — I10 Essential (primary) hypertension: Secondary | ICD-10-CM | POA: Diagnosis not present

## 2024-03-07 DIAGNOSIS — Z79899 Other long term (current) drug therapy: Secondary | ICD-10-CM | POA: Diagnosis not present

## 2024-03-07 DIAGNOSIS — Z01818 Encounter for other preprocedural examination: Secondary | ICD-10-CM

## 2024-03-07 HISTORY — PX: TENOLYSIS: SHX396

## 2024-03-07 SURGERY — INCISION, TENDON SHEATH
Anesthesia: Monitor Anesthesia Care | Laterality: Right

## 2024-03-07 MED ORDER — OXYCODONE HCL 5 MG PO TABS: 5.0000 mg | ORAL_TABLET | Freq: Once | ORAL | Status: DC | PRN

## 2024-03-07 MED ORDER — CEFAZOLIN SODIUM-DEXTROSE 2-3 GM-%(50ML) IV SOLR
INTRAVENOUS | Status: DC | PRN
  Administered 2024-03-07: 2 g via INTRAVENOUS

## 2024-03-07 MED ORDER — LIDOCAINE 2% (20 MG/ML) 5 ML SYRINGE
INTRAMUSCULAR | Status: AC
  Filled 2024-03-07: qty 10

## 2024-03-07 MED ORDER — EPHEDRINE SULFATE-NACL 50-0.9 MG/10ML-% IV SOSY
PREFILLED_SYRINGE | INTRAVENOUS | Status: DC | PRN
  Administered 2024-03-07 (×2): 5 mg via INTRAVENOUS

## 2024-03-07 MED ORDER — ONDANSETRON HCL 4 MG/2ML IJ SOLN: 4.0000 mg | Freq: Once | INTRAMUSCULAR | Status: DC | PRN

## 2024-03-07 MED ORDER — FENTANYL CITRATE (PF) 100 MCG/2ML IJ SOLN
INTRAMUSCULAR | Status: AC
  Filled 2024-03-07: qty 2

## 2024-03-07 MED ORDER — LIDOCAINE-EPINEPHRINE (PF) 1 %-1:200000 IJ SOLN
INTRAMUSCULAR | Status: DC | PRN
  Administered 2024-03-07: 10 mL

## 2024-03-07 MED ORDER — OXYCODONE HCL 5 MG/5ML PO SOLN: 5.0000 mg | Freq: Once | ORAL | Status: DC | PRN

## 2024-03-07 MED ORDER — DEXAMETHASONE SODIUM PHOSPHATE 10 MG/ML IJ SOLN: INTRAMUSCULAR | Status: DC | PRN

## 2024-03-07 MED ORDER — LIDOCAINE-EPINEPHRINE (PF) 1 %-1:200000 IJ SOLN
INTRAMUSCULAR | Status: AC
  Filled 2024-03-07: qty 60

## 2024-03-07 MED ORDER — LACTATED RINGERS IV SOLN: INTRAVENOUS | Status: DC

## 2024-03-07 MED ORDER — CEFAZOLIN SODIUM-DEXTROSE 2-4 GM/100ML-% IV SOLN
INTRAVENOUS | Status: AC
Start: 2024-03-07 — End: 2024-03-07
  Filled 2024-03-07: qty 100

## 2024-03-07 MED ORDER — BUPIVACAINE HCL (PF) 0.25 % IJ SOLN
INTRAMUSCULAR | Status: AC
  Filled 2024-03-07: qty 60

## 2024-03-07 MED ORDER — LIDOCAINE 2% (20 MG/ML) 5 ML SYRINGE
INTRAMUSCULAR | Status: DC | PRN
  Administered 2024-03-07: 80 mg via INTRAVENOUS

## 2024-03-07 MED ORDER — 0.9 % SODIUM CHLORIDE (POUR BTL) OPTIME
TOPICAL | Status: DC | PRN
  Administered 2024-03-07: 1000 mL

## 2024-03-07 MED ORDER — FENTANYL CITRATE (PF) 250 MCG/5ML IJ SOLN
INTRAMUSCULAR | Status: DC | PRN
  Administered 2024-03-07 (×2): 50 ug via INTRAVENOUS

## 2024-03-07 MED ORDER — FENTANYL CITRATE (PF) 100 MCG/2ML IJ SOLN: 25.0000 ug | INTRAMUSCULAR | Status: DC | PRN

## 2024-03-07 MED ORDER — PROPOFOL 10 MG/ML IV BOLUS
INTRAVENOUS | Status: DC | PRN
  Administered 2024-03-07: 40 mg via INTRAVENOUS

## 2024-03-07 MED ORDER — CEFAZOLIN SODIUM-DEXTROSE 2-4 GM/100ML-% IV SOLN
2.0000 g | INTRAVENOUS | Status: DC
Start: 2024-03-07 — End: 2024-03-07

## 2024-03-07 MED ORDER — PROPOFOL 500 MG/50ML IV EMUL
INTRAVENOUS | Status: DC | PRN
  Administered 2024-03-07: 100 ug/kg/min via INTRAVENOUS

## 2024-03-07 MED ORDER — ONDANSETRON HCL 4 MG/2ML IJ SOLN
INTRAMUSCULAR | Status: DC | PRN
  Administered 2024-03-07: 4 mg via INTRAVENOUS

## 2024-03-07 SURGICAL SUPPLY — 37 items
BAND RUBBER #18 3X1/16 STRL (MISCELLANEOUS) ×2 IMPLANT
BLADE SURG 15 STRL LF DISP TIS (BLADE) ×1 IMPLANT
BNDG ELASTIC 3INX 5YD STR LF (GAUZE/BANDAGES/DRESSINGS) ×1 IMPLANT
BNDG ESMARK 4X9 LF (GAUZE/BANDAGES/DRESSINGS) IMPLANT
BRUSH SCRUB EZ PLAIN DRY (MISCELLANEOUS) ×1 IMPLANT
CANISTER SUCT 1200ML W/VALVE (MISCELLANEOUS) ×1 IMPLANT
CORD BIPOLAR FORCEPS 12FT (ELECTRODE) ×1 IMPLANT
COVER BACK TABLE 60X90IN (DRAPES) ×1 IMPLANT
COVER MAYO STAND STRL (DRAPES) ×1 IMPLANT
CUFF TOURN SGL QUICK 18X4 (TOURNIQUET CUFF) ×1 IMPLANT
DRAPE EXTREMITY T 121X128X90 (DISPOSABLE) ×1 IMPLANT
DRAPE SURG 17X23 STRL (DRAPES) ×1 IMPLANT
GAUZE SPONGE 4X4 12PLY STRL (GAUZE/BANDAGES/DRESSINGS) ×1 IMPLANT
GAUZE XEROFORM 1X8 LF (GAUZE/BANDAGES/DRESSINGS) ×1 IMPLANT
GLOVE BIOGEL PI IND STRL 7.5 (GLOVE) ×1 IMPLANT
GLOVE ECLIPSE 7.0 STRL STRAW (GLOVE) ×1 IMPLANT
GLOVE INDICATOR 7.0 STRL GRN (GLOVE) ×1 IMPLANT
GLOVE SURG SYN 7.5 E (GLOVE) ×1 IMPLANT
GLOVE SURG SYN 7.5 PF PI (GLOVE) ×1 IMPLANT
GOWN STRL REUS W/ TWL XL LVL3 (GOWN DISPOSABLE) ×1 IMPLANT
GOWN STRL SURGICAL XL XLNG (GOWN DISPOSABLE) ×1 IMPLANT
NDL HYPO 25X1 1.5 SAFETY (NEEDLE) ×1 IMPLANT
NEEDLE HYPO 25X1 1.5 SAFETY (NEEDLE) ×1 IMPLANT
NS IRRIG 1000ML POUR BTL (IV SOLUTION) ×1 IMPLANT
PACK BASIN DAY SURGERY FS (CUSTOM PROCEDURE TRAY) ×1 IMPLANT
PAD CAST 3X4 CTTN HI CHSV (CAST SUPPLIES) ×1 IMPLANT
SHEET MEDIUM DRAPE 40X70 STRL (DRAPES) ×1 IMPLANT
SPIKE FLUID TRANSFER (MISCELLANEOUS) IMPLANT
STOCKINETTE 4X48 STRL (DRAPES) ×1 IMPLANT
SUT ETHILON 3 0 PS 1 (SUTURE) IMPLANT
SUT ETHILON 4 0 PS 2 18 (SUTURE) ×1 IMPLANT
SYR BULB EAR ULCER 3OZ GRN STR (SYRINGE) ×1 IMPLANT
SYR CONTROL 10ML LL (SYRINGE) ×1 IMPLANT
TOWEL GREEN STERILE FF (TOWEL DISPOSABLE) ×1 IMPLANT
TRAY DSU PREP LF (CUSTOM PROCEDURE TRAY) ×1 IMPLANT
TUBE CONNECTING 20X1/4 (TUBING) ×1 IMPLANT
UNDERPAD 30X36 HEAVY ABSORB (UNDERPADS AND DIAPERS) ×1 IMPLANT

## 2024-03-07 NOTE — Transfer of Care (Signed)
 Immediate Anesthesia Transfer of Care Note  Patient: Anne Terrell  Procedure(s) Performed: right ring finger trigger finger release (Right)  Patient Location: PACU  Anesthesia Type:General  Level of Consciousness: awake, alert , and oriented  Airway & Oxygen Therapy: Patient Spontanous Breathing and Patient connected to face mask oxygen  Post-op Assessment: Report given to RN and Post -op Vital signs reviewed and stable  Post vital signs: Reviewed and stable  Last Vitals:  Vitals Value Taken Time  BP 123/59 03/07/24 12:34  Temp 36.7 C 03/07/24 12:33  Pulse 79 03/07/24 12:37  Resp 10 03/07/24 12:37  SpO2 90 % 03/07/24 12:37  Vitals shown include unfiled device data.  Last Pain:  Vitals:   03/07/24 1043  TempSrc: Temporal  PainSc: 0-No pain         Complications: No notable events documented.

## 2024-03-07 NOTE — Discharge Instructions (Addendum)
 Postoperative instructions:  Keep your dressing and/or splint clean and dry at all times.  You can remove your dressing on post-operative day #3 and change with a dry/sterile dressing or Band-Aids as needed thereafter.    Incision instructions:  Do not soak your incision for 3 weeks after surgery.  If the incision gets wet, pat dry and do not scrub the incision.  Pain control:  You have been given a prescription to be taken as directed for post-operative pain control.  In addition, elevate the operative extremity above the heart at all times to prevent swelling and throbbing pain.  Take over-the-counter Colace, 100mg  by mouth twice a day while taking narcotic pain medications to help prevent constipation.  Follow up appointments: 1) 14 days for suture removal and wound check. 2) Dr. Jerri as scheduled.   -------------------------------------------------------------------------------------------------------------  After Surgery Pain Control:  After your surgery, post-surgical discomfort or pain is likely. This discomfort can last several days to a few weeks. At certain times of the day your discomfort may be more intense.  Did you receive a nerve block?  A nerve block can provide pain relief for one hour to two days after your surgery. As long as the nerve block is working, you will experience little or no sensation in the area the surgeon operated on.  As the nerve block wears off, you will begin to experience pain or discomfort. It is very important that you begin taking your prescribed pain medication before the nerve block fully wears off. Treating your pain at the first sign of the block wearing off will ensure your pain is better controlled and more tolerable when full-sensation returns. Do not wait until the pain is intolerable, as the medicine will be less effective. It is better to treat pain in advance than to try and catch up.  General Anesthesia:  If you did not receive a nerve  block during your surgery, you will need to start taking your pain medication shortly after your surgery and should continue to do so as prescribed by your surgeon.  Pain Medication:  Most commonly we prescribe Vicodin and Percocet for post-operative pain. Both of these medications contain a combination of acetaminophen  (Tylenol ) and a narcotic to help control pain.   It takes between 30 and 45 minutes before pain medication starts to work. It is important to take your medication before your pain level gets too intense.   Nausea is a common side effect of many pain medications. You will want to eat something before taking your pain medicine to help prevent nausea.   If you are taking a prescription pain medication that contains acetaminophen , we recommend that you do not take additional over the counter acetaminophen  (Tylenol ).  Other pain relieving options:   Using a cold pack to ice the affected area a few times a day (15 to 20 minutes at a time) can help to relieve pain, reduce swelling and bruising.   Elevation of the affected area can also help to reduce pain and swelling.  Per [facility] clinic policy, our goal is ensure optimal postoperative pain control with a multimodal pain management strategy. For all [facility] patients, our goal is to wean post-operative narcotic medications by 6 weeks post-operatively. If this is not possible due to utilization of pain medication prior to surgery, your [facility] doctor will support your acute post-operative pain control for the first 6 weeks postoperatively, with a plan to transition you back to your primary pain team following that. [facility]  will work to ensure a smooth handoff.      Post Anesthesia Home Care Instructions  Activity: Get plenty of rest for the remainder of the day. A responsible individual must stay with you for 24 hours following the procedure.  For the next 24 hours, DO NOT: -Drive a car -Advertising copywriter -Drink alcoholic  beverages -Take any medication unless instructed by your physician -Make any legal decisions or sign important papers.  Meals: Start with liquid foods such as gelatin or soup. Progress to regular foods as tolerated. Avoid greasy, spicy, heavy foods. If nausea and/or vomiting occur, drink only clear liquids until the nausea and/or vomiting subsides. Call your physician if vomiting continues.  Special Instructions/Symptoms: Your throat may feel dry or sore from the anesthesia or the breathing tube placed in your throat during surgery. If this causes discomfort, gargle with warm salt water . The discomfort should disappear within 24 hours.  If you had a scopolamine patch placed behind your ear for the management of post- operative nausea and/or vomiting:  1. The medication in the patch is effective for 72 hours, after which it should be removed.  Wrap patch in a tissue and discard in the trash. Wash hands thoroughly with soap and water . 2. You may remove the patch earlier than 72 hours if you experience unpleasant side effects which may include dry mouth, dizziness or visual disturbances. 3. Avoid touching the patch. Wash your hands with soap and water  after contact with the patch.

## 2024-03-07 NOTE — Anesthesia Preprocedure Evaluation (Addendum)
 Anesthesia Evaluation  Patient identified by MRN, date of birth, ID band Patient awake    Reviewed: Allergy & Precautions, NPO status , Patient's Chart, lab work & pertinent test results  Airway Mallampati: II  TM Distance: >3 FB     Dental  (+) Upper Dentures, Lower Dentures   Pulmonary former smoker   Pulmonary exam normal breath sounds clear to auscultation       Cardiovascular hypertension, Pt. on medications Normal cardiovascular exam Rhythm:Regular Rate:Normal     Neuro/Psych  PSYCHIATRIC DISORDERS  Depression    Chronic pain- takes MS Contin   Neuromuscular disease    GI/Hepatic hiatal hernia,,,(+)     substance abuse    Endo/Other  Obesity GLP-1 RA therapy- last dose  Renal/GU   negative genitourinary   Musculoskeletal  (+) Arthritis , Osteoarthritis,  narcotic dependentTrigger finger right ring finger  DDD lumbar and cervical spine   Abdominal   Peds  Hematology  (+) Blood dyscrasia, anemia   Anesthesia Other Findings   Reproductive/Obstetrics                              Anesthesia Physical Anesthesia Plan  ASA: 3  Anesthesia Plan: MAC   Post-op Pain Management: Minimal or no pain anticipated   Induction: Intravenous  PONV Risk Score and Plan: 3 and Treatment may vary due to age or medical condition and Propofol  infusion  Airway Management Planned: Natural Airway and Simple Face Mask  Additional Equipment: None  Intra-op Plan:   Post-operative Plan:   Informed Consent: I have reviewed the patients History and Physical, chart, labs and discussed the procedure including the risks, benefits and alternatives for the proposed anesthesia with the patient or authorized representative who has indicated his/her understanding and acceptance.     Dental advisory given  Plan Discussed with: CRNA and Anesthesiologist  Anesthesia Plan Comments:           Anesthesia Quick Evaluation

## 2024-03-07 NOTE — Anesthesia Postprocedure Evaluation (Signed)
 Anesthesia Post Note  Patient: Anne Terrell  Procedure(s) Performed: right ring finger trigger finger release (Right)     Patient location during evaluation: PACU Anesthesia Type: MAC Level of consciousness: awake and alert and oriented Pain management: pain level controlled Vital Signs Assessment: post-procedure vital signs reviewed and stable Respiratory status: spontaneous breathing, nonlabored ventilation and respiratory function stable Cardiovascular status: stable and blood pressure returned to baseline Postop Assessment: no apparent nausea or vomiting Anesthetic complications: no   No notable events documented.  Last Vitals:  Vitals:   03/07/24 1043 03/07/24 1233  BP: (!) 123/57 (!) 123/59  Pulse: 70 82  Resp: 16 20  Temp: 37 C 36.7 C  SpO2: 95% 92%    Last Pain:  Vitals:   03/07/24 1233  TempSrc:   PainSc: 0-No pain                 Jyoti Harju A.

## 2024-03-07 NOTE — Op Note (Signed)
   Date of Surgery: 03/07/2024  INDICATIONS: Anne Terrell is a 83 y.o.-year-old female who presents for surgical treatment of stenosing tenosynovitis of right ring finger ;  The patient did consent to the procedure after discussion of the risks and benefits.  PREOPERATIVE DIAGNOSIS: Stenosing tenosynovitis right ring finger  POSTOPERATIVE DIAGNOSIS: Same.  PROCEDURE: Tenolysis of right ring finger FDS and FDP tendons  SURGEON: Audreena Sachdeva Ozell Cummins, M.D.  ASSIST: Ronal Jacobsen [place], [place]; necessary for the timely completion of procedure and due to complexity of procedure..  ANESTHESIA:  Bier block  IV FLUIDS AND URINE: See anesthesia.  ESTIMATED BLOOD LOSS: minimal mL  COMPLICATIONS: None.  DESCRIPTION OF PROCEDURE: The patient was identified in the preoperative holding area. The operative site was marked by the surgeon confirmed with the patient. He is brought back to the operating room. The patient was placed supine on table. A nonsterile tourniquet was placed on the arm. Local anesthetic was placed in the planned operative site. The operative extremity was prepped and draped in standard sterile fashion. Timeout was performed. Antibiotics were given. Timeout was performed.  Tourniquet was inflated to 250 mmHg.  A vertical incision was made over the metacarpal head of the ring finger. Blunt dissection was taken down to the level of the flexor tendon. The neurovascular bundles were identified on each side of the tendon sheath and protected.  The proximal edge of the A1 pulley was identified. This was sharply incised. Tenolysis of the flexor tendon was performed with tenotomy scissors. Care was taken not to disrupt the A2 pulley. The palmar pulley was then visualized and released also. The tourniquet was then deflated and hemostasis was obtained.  The wound was thoroughly irrigated and closed with 3-0 nylon sutures. Sterile dressings were applied and the hand was placed in a soft dressing.  Patient tolerated the procedure well and was taken to the PACU in stable condition.  POSTOPERATIVE PLAN: Patient will be weight bearing as tolerated and to avoid heavy lifting for 4 weeks.    GEANNIE Ozell Cummins, MD 12:20 PM

## 2024-03-07 NOTE — H&P (Signed)
 PREOPERATIVE H&P  Chief Complaint: right ring finger trigger finger  HPI: Anne Terrell is a 83 y.o. female who presents for surgical treatment of right ring finger trigger finger.  She denies any changes in medical history.  Past Surgical History:  Procedure Laterality Date   ABDOMINAL HYSTERECTOMY  1988   APPENDECTOMY     BIOPSY OF SKIN SUBCUTANEOUS TISSUE AND/OR MUCOUS MEMBRANE  01/13/2024   Procedure: BIOPSY, SKIN, SUBCUTANEOUS TISSUE, OR MUCOUS MEMBRANE;  Surgeon: Rosalie Kitchens, MD;  Location: [facility] ENDOSCOPY;  Service: Gastroenterology;;   ORIN MEDIATE RELEASE Bilateral    COLONOSCOPY N/A 01/13/2024   Procedure: COLONOSCOPY;  Surgeon: Rosalie Kitchens, MD;  Location: [facility] ENDOSCOPY;  Service: Gastroenterology;  Laterality: N/A;   COLONOSCOPY WITH ESOPHAGOGASTRODUODENOSCOPY (EGD)     dental posts     holds bottom dentures in place   DILATION AND CURETTAGE OF UTERUS     FRACTURE SURGERY Left    LLE   MULTIPLE TOOTH EXTRACTIONS     SPINAL CORD STIMULATOR INSERTION     located near the right hip and waist area   SPINAL CORD STIMULATOR REMOVAL N/A 09/21/2019   Procedure: LUMBAR SPINAL CORD STIMULATOR REMOVAL;  Surgeon: Mindi Mt, MD;  Location: [facility] OR;  Service: Neurosurgery;  Laterality: N/A;  LUMBAR SPINAL CORD STIMULATOR REMOVAL   TONSILLECTOMY     TOTAL HIP ARTHROPLASTY Right 03/20/2018   Procedure: RIGHT TOTAL HIP ARTHROPLASTY ANTERIOR APPROACH;  Surgeon: Jerri Kay HERO, MD;  Location: [facility] ORS;  Service: Orthopedics;  Laterality: Right;   TRIGGER FINGER RELEASE Left 05/25/2023   Procedure: LEFT MIDDLE TRIGGER FINGER RELEASE;  Surgeon: Jerri Kay HERO, MD;  Location: [facility];  Service: Orthopedics;  Laterality: Left;   Social History   Socioeconomic History   Marital status: Widowed    Spouse name: Not on file   Number of children: 0   Years of education: Not on file   Highest education level: Some college, no degree  Occupational History   Not on file  Tobacco  Use   Smoking status: Former    Current packs/day: 0.00    Average packs/day: 1 pack/day for 30.0 years (30.0 ttl pk-yrs)    Types: Cigarettes    Start date: 05/03/1974    Quit date: 05/03/2004    Years since quitting: 19.8   Smokeless tobacco: Never  Vaping Use   Vaping status: Never Used  Substance and Sexual Activity   Alcohol  use: No    Alcohol /week: 0.0 standard drinks of alcohol    Drug use: No   Sexual activity: Not Currently    Birth control/protection: Surgical    Comment: hyst  Other Topics Concern   Not on file  Social History Narrative   Lives at home alone   Right handed   2 cups of caffeine daily   Social Drivers of Health   Financial Resource Strain: Low Risk  (11/01/2023)   Received from [facility]   Overall Financial Resource Strain (CARDIA)    Difficulty of Paying Living Expenses: Not hard at all  Food Insecurity: No Food Insecurity (01/13/2024)   Hunger Vital Sign    Worried About Running Out of Food in the Last Year: Never true    Ran Out of Food in the Last Year: Never true  Transportation Needs: No Transportation Needs (01/13/2024)   PRAPARE - Administrator, Civil Service (Medical): No    Lack of Transportation (Non-Medical): No  Physical Activity: Unknown (08/01/2023)   Received  from [facility]   Exercise Vital Sign    On average, how many days per week do you engage in moderate to strenuous exercise (like a brisk walk)?: 0 days    Minutes of Exercise per Session: Not on file  Stress: No Stress Concern Present (08/01/2023)   Received from [facility] - Occupational Stress Questionnaire    Feeling of Stress : Not at all  Social Connections: Unknown (01/10/2024)   Social Connection and Isolation Panel    Frequency of Communication with Friends and Family: More than three times a week    Frequency of Social Gatherings with Friends and Family: Once a week    Attends Religious Services: Not on  Insurance claims handler of Clubs or Organizations: No    Attends Banker Meetings: Never    Marital Status: Widowed   Family History  Problem Relation Age of Onset   Diabetes Mother    Varicose Veins Mother    Asthma Mother    Heart defect Sister    Allergies  Allergen Reactions   Penicillins Rash    Did it involve swelling of the face/tongue/throat, SOB, or low BP? No Did it involve sudden or severe rash/hives, skin peeling, or any reaction on the inside of your mouth or nose? No Did you need to seek medical attention at a hospital or doctor's office? No When did it last happen?      30 + years If all above answers are NO, may proceed with cephalosporin use.    Shellfish Allergy Nausea And Vomiting and Other (See Comments)    Esophageal spasms and vomiting per pt   Aspirin Other (See Comments)    Stomach pain   Prior to Admission medications   Medication Sig Start Date End Date Taking? Authorizing Provider  escitalopram (LEXAPRO) 10 MG tablet Take 10 mg by mouth daily.   Yes [provider]  gabapentin  (NEURONTIN ) 400 MG capsule Take 400 mg by mouth 4 (four) times daily.   Yes [provider]  HYDROcodone -acetaminophen  (NORCO/VICODIN) 5-325 MG tablet Take 1 tablet by mouth 3 (three) times daily as needed for moderate pain (pain score 4-6). To be taken after surgery 03/01/24   Jule Ronal CROME, PA-C  morphine  (MS CONTIN ) 15 MG 12 hr tablet Take 15 mg by mouth every 12 (twelve) hours.   Yes [provider]  ondansetron  (ZOFRAN ) 4 MG tablet Take 1 tablet (4 mg total) by mouth every 8 (eight) hours as needed for nausea or vomiting. 03/01/24   Jule Ronal CROME, PA-C  rosuvastatin  (CRESTOR ) 10 MG tablet Take 10 mg by mouth daily.   Yes [provider]  Semaglutide -Weight Management (WEGOVY ) 0.5 MG/0.5ML SOAJ Inject 0.5 mg into the skin once a week. 12/13/23        Positive ROS: All other systems have been reviewed and were otherwise  negative with the exception of those mentioned in the HPI and as above.  Physical Exam: General: Alert, no acute distress Cardiovascular: No pedal edema Respiratory: No cyanosis, no use of accessory musculature GI: abdomen soft Skin: No lesions in the area of chief complaint Neurologic: Sensation intact distally Psychiatric: Patient is competent for consent with normal mood and affect Lymphatic: no lymphedema  MUSCULOSKELETAL: exam stable  Assessment: right ring finger trigger finger  Plan: Plan for Procedure(s): INCISION, TENDON SHEATH  The risks benefits and alternatives were discussed with the patient including but not limited to  the risks of nonoperative treatment, versus surgical intervention including infection, bleeding, nerve injury,  blood clots, cardiopulmonary complications, morbidity, mortality, among others, and they were willing to proceed.   Ozell Cummins, MD 03/07/2024 10:21 AM

## 2024-03-08 ENCOUNTER — Encounter (HOSPITAL_BASED_OUTPATIENT_CLINIC_OR_DEPARTMENT_OTHER): Payer: Self-pay | Admitting: Orthopaedic Surgery

## 2024-03-14 ENCOUNTER — Ambulatory Visit (INDEPENDENT_AMBULATORY_CARE_PROVIDER_SITE_OTHER): Admitting: Orthopaedic Surgery

## 2024-03-14 DIAGNOSIS — M65341 Trigger finger, right ring finger: Secondary | ICD-10-CM

## 2024-03-14 MED ORDER — HYDROCODONE-ACETAMINOPHEN 5-325 MG PO TABS: 1.0000 | ORAL_TABLET | Freq: Every day | ORAL | 0 refills | Status: DC | PRN

## 2024-03-14 NOTE — Progress Notes (Signed)
 Post-Op Visit Note   Patient: Anne Terrell           Date of Birth: May 06, 1941           MRN: [id number] Visit Date: 03/14/2024 PCP: Dorcus Lamar POUR, MD   Assessment & Plan:  Chief Complaint:  Chief Complaint  Patient presents with   Right Hand - Follow-up    Right ring trigger finger release 03/07/2024   Visit Diagnoses:  1. Trigger finger, right ring finger     Plan: History of Present Illness The patient is 1 week postop from trigger finger release.  Doing well.  No real complaints.  Physical Exam SKIN: Incision site appears healthy.  Finger NVI.  Assessment and Plan 1 week postop s/p right RF trigger finger release - Remove stitches and apply Steri-Strips. Instruct to remove Steri-Strips in one week. - Advise against soaking the incision area. - Refill hydrocodone  prescription. - Follow up in three weeks with Morna.  Follow-Up Instructions: Return in about 3 weeks (around 04/04/2024) for with lindsey.   Orders:  No orders of the defined types were placed in this encounter.  Meds ordered this encounter  Medications   HYDROcodone -acetaminophen  (NORCO/VICODIN) 5-325 MG tablet    Sig: Take 1-2 tablets by mouth daily as needed for moderate pain (pain score 4-6).    Dispense:  7 tablet    Refill:  0    Imaging: No results found.  PMFS History: Patient Active Problem List   Diagnosis Date Noted   Trigger finger, right ring finger 03/07/2024   Ischemic colitis (HCC) 01/14/2024   Gastrointestinal hemorrhage 01/12/2024   Nausea vomiting and diarrhea 01/12/2024   Colitis 01/09/2024   Stenosing tenosynovitis of finger of left hand 05/25/2023   Exertional dyspnea 08/15/2019   Screening cholesterol level 08/15/2019   Precordial pain 08/14/2019   SIRS (systemic inflammatory response syndrome) (HCC) 03/23/2018   Acute blood loss anemia 03/23/2018   Status post right hip replacement 03/20/2018   Primary osteoarthritis of right hip    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   Varicose veins of bilateral lower extremities with other complications 05/03/2014   Major depressive disorder, recurrent episode (HCC) 04/11/2013   Major depressive disorder, single episode, moderate (HCC) 01/05/2013   Severe episode of recurrent major depressive disorder (HCC) 11/15/2012   Past Medical History:  Diagnosis Date   Arthralgia    Carpal tunnel syndrome    DDD (degenerative disc disease), cervical    DDD (degenerative disc disease), lumbar    Full dentures    History of hiatal hernia    Hypertension    Myalgia    Myofascial pain    Narcotic dependence (HCC)    Osteoarthritis    Primary localized osteoarthritis of right hip    Right rotator cuff tear    Spondylosis    Varicose veins    Wears glasses     Family History  Problem Relation Age of Onset   Diabetes Mother    Varicose Veins Mother    Asthma Mother    Heart defect Sister     Past Surgical History:  Procedure Laterality Date   ABDOMINAL HYSTERECTOMY  1988   APPENDECTOMY     BIOPSY OF SKIN SUBCUTANEOUS TISSUE AND/OR MUCOUS MEMBRANE  01/13/2024   Procedure: BIOPSY, SKIN, SUBCUTANEOUS TISSUE, OR MUCOUS MEMBRANE;  Surgeon: Rosalie Kitchens, MD;  Location: [facility] ENDOSCOPY;  Service: Gastroenterology;;   ORIN MEDIATE RELEASE Bilateral    COLONOSCOPY N/A 01/13/2024   Procedure: COLONOSCOPY;  Surgeon: Rosalie,  Oliva, MD;  Location: [facility] ENDOSCOPY;  Service: Gastroenterology;  Laterality: N/A;   COLONOSCOPY WITH ESOPHAGOGASTRODUODENOSCOPY (EGD)     dental posts     holds bottom dentures in place   DILATION AND CURETTAGE OF UTERUS     FRACTURE SURGERY Left    LLE   MULTIPLE TOOTH EXTRACTIONS     SPINAL CORD STIMULATOR INSERTION     located near the right hip and waist area   SPINAL CORD STIMULATOR REMOVAL N/A 09/21/2019   Procedure: LUMBAR SPINAL CORD STIMULATOR REMOVAL;  Surgeon: Mindi Mt, MD;  Location: [facility] OR;  Service: Neurosurgery;  Laterality: N/A;  LUMBAR SPINAL CORD STIMULATOR REMOVAL   TENOLYSIS  Right 03/07/2024   Procedure: right ring finger trigger finger release;  Surgeon: Jerri Kay HERO, MD;  Location: [facility];  Service: Orthopedics;  Laterality: Right;  right ring finger trigger finger release   TONSILLECTOMY     TOTAL HIP ARTHROPLASTY Right 03/20/2018   Procedure: RIGHT TOTAL HIP ARTHROPLASTY ANTERIOR APPROACH;  Surgeon: Jerri Kay HERO, MD;  Location: [facility] ORS;  Service: Orthopedics;  Laterality: Right;   TRIGGER FINGER RELEASE Left 05/25/2023   Procedure: LEFT MIDDLE TRIGGER FINGER RELEASE;  Surgeon: Jerri Kay HERO, MD;  Location: [facility];  Service: Orthopedics;  Laterality: Left;   Social History   Occupational History   Not on file  Tobacco Use   Smoking status: Former    Current packs/day: 0.00    Average packs/day: 1 pack/day for 30.0 years (30.0 ttl pk-yrs)    Types: Cigarettes    Start date: 05/03/1974    Quit date: 05/03/2004    Years since quitting: 19.8   Smokeless tobacco: Never  Vaping Use   Vaping status: Never Used  Substance and Sexual Activity   Alcohol  use: No    Alcohol /week: 0.0 standard drinks of alcohol    Drug use: No   Sexual activity: Not Currently    Birth control/protection: Surgical    Comment: hyst

## 2024-03-26 ENCOUNTER — Telehealth: Payer: Self-pay

## 2024-03-26 NOTE — Telephone Encounter (Signed)
 Called and scheduled for tomorrow at 8:30am.

## 2024-03-26 NOTE — Telephone Encounter (Signed)
Can I work her in tomorrow?

## 2024-03-26 NOTE — Telephone Encounter (Signed)
 Yes, can you have her come at 830.  Looks like sutures were removed at first po appt one week out

## 2024-03-26 NOTE — Telephone Encounter (Signed)
 Patient called stating that her right ring, middle, and pinky fingers are swollen and that the right ring finger has some redness and swelling.  Would like to be seen before her appt.on 04/04/2024.  CB# [id number].  Please advise.  Thank you.

## 2024-03-27 ENCOUNTER — Ambulatory Visit (INDEPENDENT_AMBULATORY_CARE_PROVIDER_SITE_OTHER): Admitting: Physician Assistant

## 2024-03-27 DIAGNOSIS — M65341 Trigger finger, right ring finger: Secondary | ICD-10-CM

## 2024-03-27 NOTE — Progress Notes (Signed)
 Post-Op Visit Note   Patient: Anne Terrell           Date of Birth: 10-20-1940           MRN: [id number] Visit Date: 03/27/2024 PCP: Dorcus Lamar POUR, MD   Assessment & Plan:  Chief Complaint:  Chief Complaint  Patient presents with   Right Hand - Follow-up    Right ring trigger finger release 03/07/2024   Visit Diagnoses:  1. Trigger finger, right ring finger     Plan: Patient is a pleasant 83 year old female who comes in today with concerns about her right hand.  She is 3 weeks status post right ring trigger finger release 7-25.  About a week ago, she noticed dry skin, swelling and increased pain around the incision.  No fevers chills or other systemic symptoms.  No drainage to the wound.  She is on chronic pain meds.  Examination of her right hand: She does have a well-approximated surgical scar, there is dry skin surrounding the wound.  Mild tenderness and swelling.  No erythema.  No drainage.  No evidence of infection or cellulitis.  At this point, recommended either mupirocin or Neosporin covered with a Band-Aid twice daily until the incision is soft.  If she develops any concerning symptoms, she will let me know.  Otherwise, follow-up in 2 weeks for recheck.  Call with concerns or questions.  Follow-Up Instructions: Return in about 2 weeks (around 04/10/2024).   Orders:  No orders of the defined types were placed in this encounter.  No orders of the defined types were placed in this encounter.   Imaging: No new imaging  PMFS History: Patient Active Problem List   Diagnosis Date Noted   Trigger finger, right ring finger 03/07/2024   Ischemic colitis (HCC) 01/14/2024   Gastrointestinal hemorrhage 01/12/2024   Nausea vomiting and diarrhea 01/12/2024   Colitis 01/09/2024   Stenosing tenosynovitis of finger of left hand 05/25/2023   Exertional dyspnea 08/15/2019   Screening cholesterol level 08/15/2019   Precordial pain 08/14/2019   SIRS (systemic inflammatory response  syndrome) (HCC) 03/23/2018   Acute blood loss anemia 03/23/2018   Status post right hip replacement 03/20/2018   Primary osteoarthritis of right hip    Atypical neuralgia 08/08/2017   Varicose veins of bilateral lower extremities with other complications 05/03/2014   Major depressive disorder, recurrent episode (HCC) 04/11/2013   Major depressive disorder, single episode, moderate (HCC) 01/05/2013   Severe episode of recurrent major depressive disorder (HCC) 11/15/2012   Past Medical History:  Diagnosis Date   Arthralgia    Carpal tunnel syndrome    DDD (degenerative disc disease), cervical    DDD (degenerative disc disease), lumbar    Full dentures    History of hiatal hernia    Hypertension    Myalgia    Myofascial pain    Narcotic dependence (HCC)    Osteoarthritis    Primary localized osteoarthritis of right hip    Right rotator cuff tear    Spondylosis    Varicose veins    Wears glasses     Family History  Problem Relation Age of Onset   Diabetes Mother    Varicose Veins Mother    Asthma Mother    Heart defect Sister     Past Surgical History:  Procedure Laterality Date   ABDOMINAL HYSTERECTOMY  1988   APPENDECTOMY     BIOPSY OF SKIN SUBCUTANEOUS TISSUE AND/OR MUCOUS MEMBRANE  01/13/2024   Procedure: BIOPSY, SKIN,  SUBCUTANEOUS TISSUE, OR MUCOUS MEMBRANE;  Surgeon: Rosalie Kitchens, MD;  Location: [facility] ENDOSCOPY;  Service: Gastroenterology;;   ORIN MEDIATE RELEASE Bilateral    COLONOSCOPY N/A 01/13/2024   Procedure: COLONOSCOPY;  Surgeon: Rosalie Kitchens, MD;  Location: [facility] ENDOSCOPY;  Service: Gastroenterology;  Laterality: N/A;   COLONOSCOPY WITH ESOPHAGOGASTRODUODENOSCOPY (EGD)     dental posts     holds bottom dentures in place   DILATION AND CURETTAGE OF UTERUS     FRACTURE SURGERY Left    LLE   MULTIPLE TOOTH EXTRACTIONS     SPINAL CORD STIMULATOR INSERTION     located near the right hip and waist area   SPINAL CORD STIMULATOR REMOVAL N/A 09/21/2019   Procedure:  LUMBAR SPINAL CORD STIMULATOR REMOVAL;  Surgeon: Mindi Mt, MD;  Location: [facility] OR;  Service: Neurosurgery;  Laterality: N/A;  LUMBAR SPINAL CORD STIMULATOR REMOVAL   TENOLYSIS Right 03/07/2024   Procedure: right ring finger trigger finger release;  Surgeon: Jerri Kay HERO, MD;  Location: [facility];  Service: Orthopedics;  Laterality: Right;  right ring finger trigger finger release   TONSILLECTOMY     TOTAL HIP ARTHROPLASTY Right 03/20/2018   Procedure: RIGHT TOTAL HIP ARTHROPLASTY ANTERIOR APPROACH;  Surgeon: Jerri Kay HERO, MD;  Location: [facility] ORS;  Service: Orthopedics;  Laterality: Right;   TRIGGER FINGER RELEASE Left 05/25/2023   Procedure: LEFT MIDDLE TRIGGER FINGER RELEASE;  Surgeon: Jerri Kay HERO, MD;  Location: [facility];  Service: Orthopedics;  Laterality: Left;   Social History   Occupational History   Not on file  Tobacco Use   Smoking status: Former    Current packs/day: 0.00    Average packs/day: 1 pack/day for 30.0 years (30.0 ttl pk-yrs)    Types: Cigarettes    Start date: 05/03/1974    Quit date: 05/03/2004    Years since quitting: 19.9   Smokeless tobacco: Never  Vaping Use   Vaping status: Never Used  Substance and Sexual Activity   Alcohol  use: No    Alcohol /week: 0.0 standard drinks of alcohol    Drug use: No   Sexual activity: Not Currently    Birth control/protection: Surgical    Comment: hyst

## 2024-04-04 ENCOUNTER — Encounter: Admitting: Physician Assistant

## 2024-04-10 ENCOUNTER — Ambulatory Visit (INDEPENDENT_AMBULATORY_CARE_PROVIDER_SITE_OTHER): Admitting: Orthopaedic Surgery

## 2024-04-10 DIAGNOSIS — M65341 Trigger finger, right ring finger: Secondary | ICD-10-CM

## 2024-04-10 NOTE — Progress Notes (Signed)
 Post-Op Visit Note   Patient: Anne Terrell           Date of Birth: Dec 07, 1940           MRN: [id number] Visit Date: 04/10/2024 PCP: Dorcus Lamar POUR, MD   Assessment & Plan:  Chief Complaint:  Chief Complaint  Patient presents with   Right Hand - Follow-up    Trigger finger release 03/07/2024   Visit Diagnoses:  1. Trigger finger, right ring finger     Plan: History of Present Illness [name] is an 83 year old female who presents with post-surgical swelling in her hand.  She is five weeks post-surgery and continues to experience swelling in her hand, particularly affecting two fingers. This results in discomfort and a sensation of obstruction in movement. The swelling extends to her fingers and causes pain. She has not been using a compression glove to manage the swelling.  Physical Exam MUSCULOSKELETAL: Surgical scar healed on hand.  Mild swelling present on fingers.  No neurovascular compromise  Assessment and Plan Postoperative hand and finger swelling Five weeks post-surgery with persistent swelling limiting fist formation. Surgical scars healed. - Recommend compression glove for swelling reduction. - Advised swelling will improve over time.  Follow-Up Instructions: Return if symptoms worsen or fail to improve.   Orders:  No orders of the defined types were placed in this encounter.  No orders of the defined types were placed in this encounter.   Imaging: No results found.  PMFS History: Patient Active Problem List   Diagnosis Date Noted   Trigger finger, right ring finger 03/07/2024   Ischemic colitis (HCC) 01/14/2024   Gastrointestinal hemorrhage 01/12/2024   Nausea vomiting and diarrhea 01/12/2024   Colitis 01/09/2024   Stenosing tenosynovitis of finger of left hand 05/25/2023   Exertional dyspnea 08/15/2019   Screening cholesterol level 08/15/2019   Precordial pain 08/14/2019   SIRS (systemic inflammatory response syndrome) (HCC) 03/23/2018    Acute blood loss anemia 03/23/2018   Status post right hip replacement 03/20/2018   Primary osteoarthritis of right hip    Atypical neuralgia 08/08/2017   Varicose veins of bilateral lower extremities with other complications 05/03/2014   Major depressive disorder, recurrent episode (HCC) 04/11/2013   Major depressive disorder, single episode, moderate (HCC) 01/05/2013   Severe episode of recurrent major depressive disorder (HCC) 11/15/2012   Past Medical History:  Diagnosis Date   Arthralgia    Carpal tunnel syndrome    DDD (degenerative disc disease), cervical    DDD (degenerative disc disease), lumbar    Full dentures    History of hiatal hernia    Hypertension    Myalgia    Myofascial pain    Narcotic dependence (HCC)    Osteoarthritis    Primary localized osteoarthritis of right hip    Right rotator cuff tear    Spondylosis    Varicose veins    Wears glasses     Family History  Problem Relation Age of Onset   Diabetes Mother    Varicose Veins Mother    Asthma Mother    Heart defect Sister     Past Surgical History:  Procedure Laterality Date   ABDOMINAL HYSTERECTOMY  1988   APPENDECTOMY     BIOPSY OF SKIN SUBCUTANEOUS TISSUE AND/OR MUCOUS MEMBRANE  01/13/2024   Procedure: BIOPSY, SKIN, SUBCUTANEOUS TISSUE, OR MUCOUS MEMBRANE;  Surgeon: Rosalie Kitchens, MD;  Location: [facility] ENDOSCOPY;  Service: Gastroenterology;;   CARPAL TUNNEL RELEASE Bilateral    COLONOSCOPY  N/A 01/13/2024   Procedure: COLONOSCOPY;  Surgeon: Rosalie Kitchens, MD;  Location: [facility] ENDOSCOPY;  Service: Gastroenterology;  Laterality: N/A;   COLONOSCOPY WITH ESOPHAGOGASTRODUODENOSCOPY (EGD)     dental posts     holds bottom dentures in place   DILATION AND CURETTAGE OF UTERUS     FRACTURE SURGERY Left    LLE   MULTIPLE TOOTH EXTRACTIONS     SPINAL CORD STIMULATOR INSERTION     located near the right hip and waist area   SPINAL CORD STIMULATOR REMOVAL N/A 09/21/2019   Procedure: LUMBAR SPINAL CORD STIMULATOR  REMOVAL;  Surgeon: Mindi Mt, MD;  Location: [facility] OR;  Service: Neurosurgery;  Laterality: N/A;  LUMBAR SPINAL CORD STIMULATOR REMOVAL   TENOLYSIS Right 03/07/2024   Procedure: right ring finger trigger finger release;  Surgeon: Jerri Kay HERO, MD;  Location: [facility];  Service: Orthopedics;  Laterality: Right;  right ring finger trigger finger release   TONSILLECTOMY     TOTAL HIP ARTHROPLASTY Right 03/20/2018   Procedure: RIGHT TOTAL HIP ARTHROPLASTY ANTERIOR APPROACH;  Surgeon: Jerri Kay HERO, MD;  Location: [facility] ORS;  Service: Orthopedics;  Laterality: Right;   TRIGGER FINGER RELEASE Left 05/25/2023   Procedure: LEFT MIDDLE TRIGGER FINGER RELEASE;  Surgeon: Jerri Kay HERO, MD;  Location: [facility];  Service: Orthopedics;  Laterality: Left;   Social History   Occupational History   Not on file  Tobacco Use   Smoking status: Former    Current packs/day: 0.00    Average packs/day: 1 pack/day for 30.0 years (30.0 ttl pk-yrs)    Types: Cigarettes    Start date: 05/03/1974    Quit date: 05/03/2004    Years since quitting: 19.9   Smokeless tobacco: Never  Vaping Use   Vaping status: Never Used  Substance and Sexual Activity   Alcohol  use: No    Alcohol /week: 0.0 standard drinks of alcohol    Drug use: No   Sexual activity: Not Currently    Birth control/protection: Surgical    Comment: hyst

## 2024-05-22 ENCOUNTER — Ambulatory Visit (INDEPENDENT_AMBULATORY_CARE_PROVIDER_SITE_OTHER): Admitting: Physician Assistant

## 2024-05-22 DIAGNOSIS — M65341 Trigger finger, right ring finger: Secondary | ICD-10-CM

## 2024-05-22 NOTE — Progress Notes (Signed)
 Post-Op Visit Note   Patient: Anne Terrell           Date of Birth: 10/31/1940           MRN: [id number] Visit Date: 05/22/2024 PCP: Dorcus Lamar POUR, MD   Assessment & Plan:  Chief Complaint:  Chief Complaint  Patient presents with   Right Hand - Follow-up    Ring TF release 03/07/2024   Visit Diagnoses:  1. Trigger finger, right ring finger     Plan: Patient is a pleasant 83 year old female who comes in today a little less than 3 months out right ring trigger finger release, date of surgery 03/07/2024.  She continues to experience pain and swelling along the palmar aspect of her hand.  She has noticed thickening of the tendon which she thinks is limiting her from lying her hand flat on the countertop.  Of note, she does take chronic hydrocodone .  She is not a diabetic.  Examination of her right hand reveals a fully healed surgical scar without complication.  No reproducible triggering.  She does have thickening and cord.  She has a positive tabletop test.  She is neurovascular intact distally.  At this point, I feel as though she is likely developing a Dupuytren's contracture.  She would like to follow-up with Dr. Erwin for further evaluation and treatment recommendation.  Call us  with any concerns or questions in meantime.  Follow-Up Instructions: Return for with Dr. Erwin.   Orders:  No orders of the defined types were placed in this encounter.  No orders of the defined types were placed in this encounter.   Imaging:   PMFS History: Patient Active Problem List   Diagnosis Date Noted   Trigger finger, right ring finger 03/07/2024   Ischemic colitis (HCC) 01/14/2024   Gastrointestinal hemorrhage 01/12/2024   Nausea vomiting and diarrhea 01/12/2024   Colitis 01/09/2024   Stenosing tenosynovitis of finger of left hand 05/25/2023   Exertional dyspnea 08/15/2019   Screening cholesterol level 08/15/2019   Precordial pain 08/14/2019   SIRS (systemic inflammatory response  syndrome) (HCC) 03/23/2018   Acute blood loss anemia 03/23/2018   Status post right hip replacement 03/20/2018   Primary osteoarthritis of right hip    Atypical neuralgia 08/08/2017   Varicose veins of bilateral lower extremities with other complications 05/03/2014   Major depressive disorder, recurrent episode (HCC) 04/11/2013   Major depressive disorder, single episode, moderate (HCC) 01/05/2013   Severe episode of recurrent major depressive disorder (HCC) 11/15/2012   Past Medical History:  Diagnosis Date   Arthralgia    Carpal tunnel syndrome    DDD (degenerative disc disease), cervical    DDD (degenerative disc disease), lumbar    Full dentures    History of hiatal hernia    Hypertension    Myalgia    Myofascial pain    Narcotic dependence (HCC)    Osteoarthritis    Primary localized osteoarthritis of right hip    Right rotator cuff tear    Spondylosis    Varicose veins    Wears glasses     Family History  Problem Relation Age of Onset   Diabetes Mother    Varicose Veins Mother    Asthma Mother    Heart defect Sister     Past Surgical History:  Procedure Laterality Date   ABDOMINAL HYSTERECTOMY  1988   APPENDECTOMY     BIOPSY OF SKIN SUBCUTANEOUS TISSUE AND/OR MUCOUS MEMBRANE  01/13/2024   Procedure: BIOPSY, SKIN, SUBCUTANEOUS  TISSUE, OR MUCOUS MEMBRANE;  Surgeon: Rosalie Kitchens, MD;  Location: [facility] ENDOSCOPY;  Service: Gastroenterology;;   ORIN MEDIATE RELEASE Bilateral    COLONOSCOPY N/A 01/13/2024   Procedure: COLONOSCOPY;  Surgeon: Rosalie Kitchens, MD;  Location: [facility] ENDOSCOPY;  Service: Gastroenterology;  Laterality: N/A;   COLONOSCOPY WITH ESOPHAGOGASTRODUODENOSCOPY (EGD)     dental posts     holds bottom dentures in place   DILATION AND CURETTAGE OF UTERUS     FRACTURE SURGERY Left    LLE   MULTIPLE TOOTH EXTRACTIONS     SPINAL CORD STIMULATOR INSERTION     located near the right hip and waist area   SPINAL CORD STIMULATOR REMOVAL N/A 09/21/2019   Procedure:  LUMBAR SPINAL CORD STIMULATOR REMOVAL;  Surgeon: Mindi Mt, MD;  Location: [facility] OR;  Service: Neurosurgery;  Laterality: N/A;  LUMBAR SPINAL CORD STIMULATOR REMOVAL   TENOLYSIS Right 03/07/2024   Procedure: right ring finger trigger finger release;  Surgeon: Jerri Kay HERO, MD;  Location: [facility];  Service: Orthopedics;  Laterality: Right;  right ring finger trigger finger release   TONSILLECTOMY     TOTAL HIP ARTHROPLASTY Right 03/20/2018   Procedure: RIGHT TOTAL HIP ARTHROPLASTY ANTERIOR APPROACH;  Surgeon: Jerri Kay HERO, MD;  Location: [facility];  Service: Orthopedics;  Laterality: Right;   TRIGGER FINGER RELEASE Left 05/25/2023   Procedure: LEFT MIDDLE TRIGGER FINGER RELEASE;  Surgeon: Jerri Kay HERO, MD;  Location: [facility];  Service: Orthopedics;  Laterality: Left;   Social History   Occupational History   Not on file  Tobacco Use   Smoking status: Former    Current packs/day: 0.00    Average packs/day: 1 pack/day for 30.0 years (30.0 ttl pk-yrs)    Types: Cigarettes    Start date: 05/03/1974    Quit date: 05/03/2004    Years since quitting: 20.0   Smokeless tobacco: Never  Vaping Use   Vaping status: Never Used  Substance and Sexual Activity   Alcohol  use: No    Alcohol /week: 0.0 standard drinks of alcohol    Drug use: No   Sexual activity: Not Currently    Birth control/protection: Surgical    Comment: hyst

## 2024-06-05 ENCOUNTER — Ambulatory Visit (INDEPENDENT_AMBULATORY_CARE_PROVIDER_SITE_OTHER): Admitting: Orthopedic Surgery

## 2024-06-05 DIAGNOSIS — R202 Paresthesia of skin: Secondary | ICD-10-CM

## 2024-06-05 DIAGNOSIS — M720 Palmar fascial fibromatosis [Dupuytren]: Secondary | ICD-10-CM

## 2024-06-05 DIAGNOSIS — R200 Anesthesia of skin: Secondary | ICD-10-CM

## 2024-06-05 NOTE — Progress Notes (Signed)
 Anne Terrell - 83 y.o. female MRN [id number]  Date of birth: Aug 22, 1941  Office Visit Note: Visit Date: 06/05/2024 PCP: Dorcus Lamar POUR, MD Referred by: Dorcus Lamar POUR, MD  Subjective: No chief complaint on file.  HPI: Anne Terrell is a pleasant 83 y.o. female who presents today for evaluation of right hand and forearm pain with associated numbness and tingling, she has also developed a palmar cord status post a recent trigger digit surgery in line with the ring finger.  Surgery was performed in July of this year.  She does have a history of prior right open carpal tunnel release done over 20 to 30 years ago, feels that she has developed numbness and tingling in the radial aspect of the hand that has been progressive over the past multiple months as well.  Has not undergone recent treatment or workup.  She has been sent by Dr. Jerri to myself today for specific hand surgical evaluation.  Pertinent ROS were reviewed with the patient and found to be negative unless otherwise specified above in HPI.   Visit Reason: right hand and forearm pain, associated numbness and tingling, palmar cord formation status post recent trigger digit surgery Pain at incision site is the worst Duration of symptoms:3 months Hand dominance: right Occupation: retired Diabetic: No Smoking: No Heart/Lung History: none Blood Thinners: none  Prior Testing/EMG: none Injections (Date): none Treatments: pain medication Prior Surgery: right ring trigger release 03/06/24  Assessment & Plan: Visit Diagnoses:  1. Numbness and tingling in right hand   2. Dupuytren's disease of palm of right hand     Plan: Based on her clinical examination today, I do see evidence of reactive Dupuytren's palmar cord formation in the ring finger with minimal contracture.  She is also demonstrating signs and symptoms consistent with recurrent carpal tunnel in the right hand.  I would like to further investigate this with  electrodiagnostically of the right upper extremity in order to better delineate potential recurrent nerve pathology given her previous surgical history.  This was discussed at length with her today, she expressed full understanding.  Will return to me after electrodiagnostic study is complete to review results and discuss appropriate next treatment steps.  We also did discuss the underlying Dupuytren's disease as well as the etiology and pathophysiology of this condition, we discussed potential treatment modalities as well in the future.  Follow-up: No follow-ups on file.   Meds & Orders: No orders of the defined types were placed in this encounter.   Orders Placed This Encounter  Procedures   Ambulatory referral to Physical Medicine Rehab     Procedures: No procedures performed      Clinical History: ADDENDUM REPORT: 06/06/2020 12:25   ADDENDUM: Lumbar spine report was linked to the same report.   Segmentation: 5 non rib-bearing lumbar type vertebral bodies are present. The lowest fully formed vertebral body is L5.   Alignment: Slight anterolisthesis present at L3-4 and L4-5.   Vertebral bodies: Superior endplate Schmorl's node is present at L3. Schmorl's node at T11-12 has associated chronic fatty endplate marrow changes. Mild chronic fatty endplate marrow changes are present at L5-S1.   Conus medullaris and distal thoracic spinal cord are within normal limits. The tip is at L2.   Limited imaging the abdomen is unremarkable. There is no significant adenopathy. No solid organ lesions are present.   Disc levels: L1-2: Negative.   L2-3: Mild disc bulging is present. Facet hypertrophy is present on the right. No  significant stenosis is present.   L3-4: A mild broad-based disc protrusion is present. Facet hypertrophy is worse on the right. Mild foraminal narrowing is present bilaterally. The central canal is patent.   L4-5: Uncovering of a broad-based disc protrusion is  present. Moderate facet hypertrophy is noted bilaterally. Moderate right and mild left foraminal stenosis is present.   L5-S1: Chronic thinning of the disc space is present. A mild broad-based protrusion is present. Mild bilateral foraminal stenosis is present.   Impressions:   1. Mild foraminal narrowing bilaterally at L3-4 secondary to disc bulging and facet hypertrophy. 2. Moderate right and mild left foraminal stenosis at L4-5 due to a broad-based disc protrusion and asymmetric right-sided facet hypertrophy. 3. Chronic thinning of the disc with mild bilateral foraminal narrowing at L5-S1. 4. Slight anterolisthesis at L3-4 and L4-5.     Electronically Signed   By: Lonni Necessary M.D.   On: 06/06/2020 12:25  She reports that she quit smoking about 20 years ago. Her smoking use included cigarettes. She started smoking about 50 years ago. She has a 30 pack-year smoking history. She has never used smokeless tobacco. No results for input(s): HGBA1C, LABURIC in the last 8760 hours.  Objective:   Vital Signs: There were no vitals taken for this visit.  Physical Exam  Gen: Well-appearing, in no acute distress; non-toxic CV: Regular Rate. Well-perfused. Warm.  Resp: Breathing unlabored on room air; no wheezing. Psych: Fluid speech in conversation; appropriate affect; normal thought process  Ortho Exam Right hand: - Well-healed palmar incision line with the ring finger at the A1 pulley level, evidence of cordlike structure in line with the ring finger, mild contracture to the MP approximately 5 to 10 degrees, passively correctable - Positive Tinel's over the carpal tunnel region, previously well-healed incision, numbness in the median nerve distribution, hand remains warm and well-perfused, positive Phalen's, positive Durkan's compression  Imaging: No results found.  Past Medical/Family/Surgical/Social History: Medications & Allergies reviewed per EMR, new medications  updated. Patient Active Problem List   Diagnosis Date Noted   Trigger finger, right ring finger 03/07/2024   Ischemic colitis 01/14/2024   Gastrointestinal hemorrhage 01/12/2024   Nausea vomiting and diarrhea 01/12/2024   Colitis 01/09/2024   Stenosing tenosynovitis of finger of left hand 05/25/2023   Exertional dyspnea 08/15/2019   Screening cholesterol level 08/15/2019   Precordial pain 08/14/2019   SIRS (systemic inflammatory response syndrome) (HCC) 03/23/2018   Acute blood loss anemia 03/23/2018   Status post right hip replacement 03/20/2018   Primary osteoarthritis of right hip    Atypical neuralgia 08/08/2017   Varicose veins of bilateral lower extremities with other complications 05/03/2014   Major depressive disorder, recurrent episode 04/11/2013   Major depressive disorder, single episode, moderate (HCC) 01/05/2013   Severe episode of recurrent major depressive disorder (HCC) 11/15/2012   Past Medical History:  Diagnosis Date   Arthralgia    Carpal tunnel syndrome    DDD (degenerative disc disease), cervical    DDD (degenerative disc disease), lumbar    Full dentures    History of hiatal hernia    Hypertension    Myalgia    Myofascial pain    Narcotic dependence (HCC)    Osteoarthritis    Primary localized osteoarthritis of right hip    Right rotator cuff tear    Spondylosis    Varicose veins    Wears glasses    Family History  Problem Relation Age of Onset   Diabetes Mother  Varicose Veins Mother    Asthma Mother    Heart defect Sister    Past Surgical History:  Procedure Laterality Date   ABDOMINAL HYSTERECTOMY  1988   APPENDECTOMY     BIOPSY OF SKIN SUBCUTANEOUS TISSUE AND/OR MUCOUS MEMBRANE  01/13/2024   Procedure: BIOPSY, SKIN, SUBCUTANEOUS TISSUE, OR MUCOUS MEMBRANE;  Surgeon: Rosalie Kitchens, MD;  Location: [facility] ENDOSCOPY;  Service: Gastroenterology;;   ORIN MEDIATE RELEASE Bilateral    COLONOSCOPY N/A 01/13/2024   Procedure: COLONOSCOPY;  Surgeon:  Rosalie Kitchens, MD;  Location: [facility] ENDOSCOPY;  Service: Gastroenterology;  Laterality: N/A;   COLONOSCOPY WITH ESOPHAGOGASTRODUODENOSCOPY (EGD)     dental posts     holds bottom dentures in place   DILATION AND CURETTAGE OF UTERUS     FRACTURE SURGERY Left    LLE   MULTIPLE TOOTH EXTRACTIONS     SPINAL CORD STIMULATOR INSERTION     located near the right hip and waist area   SPINAL CORD STIMULATOR REMOVAL N/A 09/21/2019   Procedure: LUMBAR SPINAL CORD STIMULATOR REMOVAL;  Surgeon: Mindi Mt, MD;  Location: [facility] OR;  Service: Neurosurgery;  Laterality: N/A;  LUMBAR SPINAL CORD STIMULATOR REMOVAL   TENOLYSIS Right 03/07/2024   Procedure: right ring finger trigger finger release;  Surgeon: Jerri Kay HERO, MD;  Location: [facility];  Service: Orthopedics;  Laterality: Right;  right ring finger trigger finger release   TONSILLECTOMY     TOTAL HIP ARTHROPLASTY Right 03/20/2018   Procedure: RIGHT TOTAL HIP ARTHROPLASTY ANTERIOR APPROACH;  Surgeon: Jerri Kay HERO, MD;  Location: [facility] ORS;  Service: Orthopedics;  Laterality: Right;   TRIGGER FINGER RELEASE Left 05/25/2023   Procedure: LEFT MIDDLE TRIGGER FINGER RELEASE;  Surgeon: Jerri Kay HERO, MD;  Location: [facility];  Service: Orthopedics;  Laterality: Left;   Social History   Occupational History   Not on file  Tobacco Use   Smoking status: Former    Current packs/day: 0.00    Average packs/day: 1 pack/day for 30.0 years (30.0 ttl pk-yrs)    Types: Cigarettes    Start date: 05/03/1974    Quit date: 05/03/2004    Years since quitting: 20.1   Smokeless tobacco: Never  Vaping Use   Vaping status: Never Used  Substance and Sexual Activity   Alcohol  use: No    Alcohol /week: 0.0 standard drinks of alcohol    Drug use: No   Sexual activity: Not Currently    Birth control/protection: Surgical    Comment: hyst    Cina Klumpp Estela) Arlinda, M.D. [facility], Hand Surgery

## 2024-06-22 ENCOUNTER — Ambulatory Visit (INDEPENDENT_AMBULATORY_CARE_PROVIDER_SITE_OTHER): Admitting: Physical Medicine and Rehabilitation

## 2024-06-22 DIAGNOSIS — R109 Unspecified abdominal pain: Secondary | ICD-10-CM | POA: Insufficient documentation

## 2024-06-22 DIAGNOSIS — M79641 Pain in right hand: Secondary | ICD-10-CM

## 2024-06-22 DIAGNOSIS — R29898 Other symptoms and signs involving the musculoskeletal system: Secondary | ICD-10-CM | POA: Diagnosis not present

## 2024-06-22 DIAGNOSIS — M25559 Pain in unspecified hip: Secondary | ICD-10-CM | POA: Insufficient documentation

## 2024-06-22 DIAGNOSIS — R202 Paresthesia of skin: Secondary | ICD-10-CM

## 2024-06-22 DIAGNOSIS — G894 Chronic pain syndrome: Secondary | ICD-10-CM | POA: Insufficient documentation

## 2024-06-22 DIAGNOSIS — M5030 Other cervical disc degeneration, unspecified cervical region: Secondary | ICD-10-CM | POA: Insufficient documentation

## 2024-06-22 DIAGNOSIS — M79601 Pain in right arm: Secondary | ICD-10-CM | POA: Diagnosis not present

## 2024-06-22 DIAGNOSIS — F329 Major depressive disorder, single episode, unspecified: Secondary | ICD-10-CM | POA: Insufficient documentation

## 2024-06-22 NOTE — Progress Notes (Signed)
 Pain Scale   Average Pain 7    RUE NCS, she complains of pain, burning and weakness in middle finger to pinky and up arm. HX of trigger finger surgery on 7/2 and bilat carpal tunnel surgery 30 yrs ago. Also mentions pain in left hand.   Right handed

## 2024-06-22 NOTE — Progress Notes (Signed)
 Chriss Redel - 83 y.o. female MRN [id number]  Date of birth: 11-Oct-1940  Office Visit Note: Visit Date: 06/22/2024 PCP: Dorcus Lamar POUR, MD Referred by: Arlinda Buster, MD  Subjective: Chief Complaint  Patient presents with   Right Arm - Pain, Weakness   HPI: Karime Scheuermann is a 83 y.o. female who comes in today at the request of Dr. Anshul Agarwala for evaluation and management of chronic, worsening and severe pain, numbness and tingling in the Right upper extremities.  Patient is Right hand dominant.  She is reporting increasing but chronic symptoms in the right hand particularly the middle finger to the fifth digit that radiate up the arm.  No frank radicular symptoms down the arm.  She does have a prior remote history of carpal tunnel release bilaterally 20 to 30 years ago.  Most recently underwent trigger finger surgery and has developed a palmar contracture.  Her numbness tingling and weakness symptoms have been progressive over the last several months.  She has not really done much in the way of current treatment for that.  She is not diabetic and does not have thyroid disease.  She does carry a diagnosis of polyneuropathy.   I spent more than 30 minutes speaking face-to-face with the patient with 50% of the time in counseling and discussing coordination of care.      Review of Systems  Musculoskeletal:  Positive for joint pain and neck pain.  Neurological:  Positive for tingling and focal weakness.  All other systems reviewed and are negative.  Otherwise per HPI.  Assessment & Plan: Visit Diagnoses:    ICD-10-CM   1. Paresthesia of skin  R20.2 NCV with EMG (electromyography)    2. Pain in right hand  M79.641     3. Right hand weakness  R29.898     4. Right arm pain  M79.601        Plan: Impression: Symptoms somewhat crossing dermatomes could be cervical radiculopathy versus underlying carpal tunnel syndrome.  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 There is no significant electrodiagnostic evidence of any other focal nerve entrapment, brachial plexopathy or cervical radiculopathy.   Recommendations: 1.  Follow-up with referring physician. 2.  Continue current management of symptoms. 3.  Continue use of resting splint at night-time and as needed during the day. 4.  Suggest surgical evaluation.  Meds & Orders: No orders of the defined types were placed in this encounter.   Orders Placed This Encounter  Procedures   NCV with EMG (electromyography)    Follow-up: Return for Anshul Agarwala, MD.   Procedures: No procedures performed  EMG & NCV Findings: Evaluation of the right median motor nerve showed prolonged distal onset latency (4.4 ms) and decreased conduction velocity (Elbow-Wrist, 48 m/s).  The right median (across palm) sensory nerve showed prolonged distal peak latency (Wrist, 4.5 ms) and prolonged distal peak latency (Palm, 2.3 ms).  The right ulnar sensory nerve showed prolonged distal peak latency (3.8 ms), reduced amplitude (13.2 V), and decreased conduction velocity (Wrist-5th Digit, 37 m/s).  All remaining nerves (as indicated in the following tables) were within normal limits.    Needle evaluation of the right abductor pollicis brevis muscle showed diminished recruitment.  All remaining muscles (as indicated in the following table) showed no evidence of electrical instability.    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 There is no significant electrodiagnostic evidence of any other focal nerve entrapment, brachial plexopathy or cervical radiculopathy.   Recommendations: 1.  Follow-up with referring  physician. 2.  Continue current management of symptoms. 3.  Continue use of resting splint at night-time and as needed during the day. 4.  Suggest surgical evaluation.  ___________________________ Prentice Masters FAAPMR Board Certified, American Board of Physical Medicine and Rehabilitation    Nerve Conduction Studies Anti Sensory Summary Table   Stim Site NR Peak (ms) Norm Peak (ms) P-T Amp (V) Norm P-T Amp Site1 Site2 Delta-P (ms) Dist (cm) Vel (m/s) Norm Vel (m/s)  Right Median Acr Palm Anti Sensory (2nd Digit)  30.9C  Wrist    *4.5 <3.6 16.8 >10 Wrist Palm 2.2 0.0    Palm    *2.3 <2.0 15.9         Right Radial Anti Sensory (Base 1st Digit)  30.9C  Wrist    2.1 <3.1 20.2  Wrist Base 1st Digit 2.1 0.0    Right Ulnar Anti Sensory (5th Digit)  31.4C  Wrist    *3.8 <3.7 *13.2 >15.0 Wrist 5th Digit 3.8 14.0 *37 >38   Motor Summary Table   Stim Site NR Onset (ms) Norm Onset (ms) O-P Amp (mV) Norm O-P Amp Site1 Site2 Delta-0 (ms) Dist (cm) Vel (m/s) Norm Vel (m/s)  Right Median Motor (Abd Poll Brev)  31.2C  Wrist    *4.4 <4.2 5.7 >5 Elbow Wrist 4.0 19.0 *48 >50  Elbow    8.4  5.0         Right Ulnar Motor (Abd Dig Min)  31.4C  Wrist    2.8 <4.2 8.2 >3 B Elbow Wrist 3.3 18.0 55 >53  B Elbow    6.1  9.2  A Elbow B Elbow 1.3 10.0 77 >53  A Elbow    7.4  7.8          EMG   Side Muscle Nerve Root Ins Act Fibs Psw Amp Dur Poly Recrt Int Bruna Comment  Right Abd Poll Brev Median C8-T1 Nml Nml Nml Nml Nml 0 *Reduced Nml   Right 1stDorInt Ulnar C8-T1 Nml Nml Nml Nml Nml 0 Nml Nml   Right PronatorTeres Median C6-7 Nml Nml Nml Nml Nml 0 Nml Nml   Right Biceps Musculocut C5-6 Nml Nml Nml Nml Nml 0 Nml Nml   Right Deltoid Axillary C5-6 Nml Nml Nml Nml Nml 0 Nml Nml     Nerve Conduction Studies Anti Sensory Left/Right Comparison   Stim Site L Lat (ms) R Lat (ms) L-R Lat (ms) L Amp (V) R Amp (V) L-R Amp (%) Site1 Site2 L Vel (m/s) R Vel (m/s) L-R Vel (m/s)  Median Acr Palm Anti Sensory  (2nd Digit)  30.9C  Wrist  *4.5   16.8  Wrist Palm     Palm  *2.3   15.9        Radial Anti Sensory (Base 1st Digit)  30.9C  Wrist  2.1   20.2  Wrist Base 1st Digit     Ulnar Anti Sensory (5th Digit)  31.4C  Wrist  *3.8   *13.2  Wrist 5th Digit  *37    Motor Left/Right Comparison   Stim Site L Lat (ms) R Lat (ms) L-R Lat (ms) L Amp (mV) R Amp (mV) L-R Amp (%) Site1 Site2 L Vel (  m/s) R Vel (m/s) L-R Vel (m/s)  Median Motor (Abd Poll Brev)  31.2C  Wrist  *4.4   5.7  Elbow Wrist  *48   Elbow  8.4   5.0        Ulnar Motor (Abd Dig Min)  31.4C  Wrist  2.8   8.2  B Elbow Wrist  55   B Elbow  6.1   9.2  A Elbow B Elbow  77   A Elbow  7.4   7.8           Waveforms:            Clinical History: ADDENDUM REPORT: 06/06/2020 12:25   ADDENDUM: Lumbar spine report was linked to the same report.   Segmentation: 5 non rib-bearing lumbar type vertebral bodies are present. The lowest fully formed vertebral body is L5.   Alignment: Slight anterolisthesis present at L3-4 and L4-5.   Vertebral bodies: Superior endplate Schmorl's node is present at L3. Schmorl's node at T11-12 has associated chronic fatty endplate marrow changes. Mild chronic fatty endplate marrow changes are present at L5-S1.   Conus medullaris and distal thoracic spinal cord are within normal limits. The tip is at L2.   Limited imaging the abdomen is unremarkable. There is no significant adenopathy. No solid organ lesions are present.   Disc levels: L1-2: Negative.   L2-3: Mild disc bulging is present. Facet hypertrophy is present on the right. No significant stenosis is present.   L3-4: A mild broad-based disc protrusion is present. Facet hypertrophy is worse on the right. Mild foraminal narrowing is present bilaterally. The central canal is patent.   L4-5: Uncovering of a broad-based disc protrusion is present. Moderate facet hypertrophy is noted bilaterally. Moderate right and mild left foraminal  stenosis is present.   L5-S1: Chronic thinning of the disc space is present. A mild broad-based protrusion is present. Mild bilateral foraminal stenosis is present.   Impressions:   1. Mild foraminal narrowing bilaterally at L3-4 secondary to disc bulging and facet hypertrophy. 2. Moderate right and mild left foraminal stenosis at L4-5 due to a broad-based disc protrusion and asymmetric right-sided facet hypertrophy. 3. Chronic thinning of the disc with mild bilateral foraminal narrowing at L5-S1. 4. Slight anterolisthesis at L3-4 and L4-5.     Electronically Signed   By: Lonni Necessary M.D.   On: 06/06/2020 12:25   She reports that she quit smoking about 20 years ago. Her smoking use included cigarettes. She started smoking about 50 years ago. She has a 30 pack-year smoking history. She has never used smokeless tobacco. No results for input(s): HGBA1C, LABURIC in the last 8760 hours.  Objective:  VS:  HT:    WT:   BMI:     BP:   HR: bpm  TEMP: ( )  RESP:  Physical Exam Vitals and nursing note reviewed.  Constitutional:      General: She is not in acute distress.    Appearance: Normal appearance. She is well-developed. She is not ill-appearing.  HENT:     Head: Normocephalic and atraumatic.  Eyes:     Conjunctiva/sclera: Conjunctivae normal.     Pupils: Pupils are equal, round, and reactive to light.  Cardiovascular:     Rate and Rhythm: Normal rate.     Pulses: Normal pulses.  Pulmonary:     Effort: Pulmonary effort is normal.  Musculoskeletal:        General: Tenderness present. No swelling or deformity.  Right lower leg: No edema.     Left lower leg: No edema.     Comments: Inspection reveals no atrophy of the bilateral APB or FDI or hand intrinsics. There is no swelling, color changes, allodynia or dystrophic changes. There is 5 out of 5 strength in the bilateral wrist extension, finger abduction and long finger flexion. There is intact sensation to  light touch in all dermatomal and peripheral nerve distributions. There is a positive Phalen's test bilaterally. There is a negative Hoffmann's test bilaterally.  Skin:    General: Skin is warm and dry.     Findings: No erythema or rash.  Neurological:     General: No focal deficit present.     Mental Status: She is alert and oriented to person, place, and time.     Cranial Nerves: No cranial nerve deficit.     Sensory: No sensory deficit.     Motor: No weakness or abnormal muscle tone.     Coordination: Coordination normal.     Gait: Gait normal.  Psychiatric:        Mood and Affect: Mood normal.        Behavior: Behavior normal.     Ortho Exam  Imaging: No results found.  Past Medical/Family/Surgical/Social History: Medications & Allergies reviewed per EMR, new medications updated. 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 05/25/2023   Essential hypertension 04/22/2022   Chronic kidney disease, stage 3a (HCC) 02/18/2022   Opioid dependence (HCC) 12/22/2021   Polyneuropathy associated with underlying disease 12/22/2021   Exertional dyspnea 08/15/2019   Screening cholesterol level 08/15/2019   Precordial pain 08/14/2019   Complications due to nervous system device, implant, and graft 07/11/2019   Neuropathy 04/02/2019   SIRS (systemic inflammatory response syndrome) (HCC) 03/23/2018   Acute blood loss anemia 03/23/2018   Status post right hip replacement 03/20/2018   Primary osteoarthritis of right hip    Atypical  neuralgia 08/08/2017   Varicose veins of bilateral lower extremities with other complications 05/03/2014   Major depressive disorder, recurrent episode 04/11/2013   Major depressive disorder, single episode, moderate (HCC) 01/05/2013   Severe episode of recurrent major depressive disorder (HCC) 11/15/2012   Past Medical History:  Diagnosis Date   Arthralgia    Carpal tunnel syndrome    DDD (degenerative disc disease), cervical    DDD (degenerative disc disease), lumbar    Full dentures    History of hiatal hernia    Hypertension    Myalgia    Myofascial pain    Narcotic dependence (HCC)    Osteoarthritis    Primary localized osteoarthritis of right hip    Right rotator cuff tear    Spondylosis    Varicose veins    Wears glasses    Family History  Problem Relation Age of Onset   Diabetes Mother    Varicose Veins Mother    Asthma Mother    Heart defect Sister    Past Surgical History:  Procedure Laterality Date   ABDOMINAL HYSTERECTOMY  1988   APPENDECTOMY  BIOPSY OF SKIN SUBCUTANEOUS TISSUE AND/OR MUCOUS MEMBRANE  01/13/2024   Procedure: BIOPSY, SKIN, SUBCUTANEOUS TISSUE, OR MUCOUS MEMBRANE;  Surgeon: Rosalie Kitchens, MD;  Location: [facility] ENDOSCOPY;  Service: Gastroenterology;;   ORIN MEDIATE RELEASE Bilateral    COLONOSCOPY N/A 01/13/2024   Procedure: COLONOSCOPY;  Surgeon: Rosalie Kitchens, MD;  Location: [facility] ENDOSCOPY;  Service: Gastroenterology;  Laterality: N/A;   COLONOSCOPY WITH ESOPHAGOGASTRODUODENOSCOPY (EGD)     dental posts     holds bottom dentures in place   DILATION AND CURETTAGE OF UTERUS     FRACTURE SURGERY Left    LLE   MULTIPLE TOOTH EXTRACTIONS     SPINAL CORD STIMULATOR INSERTION     located near the right hip and waist area   SPINAL CORD STIMULATOR REMOVAL N/A 09/21/2019   Procedure: LUMBAR SPINAL CORD STIMULATOR REMOVAL;  Surgeon: Mindi Mt, MD;  Location: [facility] OR;  Service: Neurosurgery;  Laterality: N/A;  LUMBAR SPINAL CORD STIMULATOR REMOVAL    TENOLYSIS Right 03/07/2024   Procedure: right ring finger trigger finger release;  Surgeon: Jerri Kay HERO, MD;  Location: [facility];  Service: Orthopedics;  Laterality: Right;  right ring finger trigger finger release   TONSILLECTOMY     TOTAL HIP ARTHROPLASTY Right 03/20/2018   Procedure: RIGHT TOTAL HIP ARTHROPLASTY ANTERIOR APPROACH;  Surgeon: Jerri Kay HERO, MD;  Location: [facility] ORS;  Service: Orthopedics;  Laterality: Right;   TRIGGER FINGER RELEASE Left 05/25/2023   Procedure: LEFT MIDDLE TRIGGER FINGER RELEASE;  Surgeon: Jerri Kay HERO, MD;  Location: [facility];  Service: Orthopedics;  Laterality: Left;   Social History   Occupational History   Not on file  Tobacco Use   Smoking status: Former    Current packs/day: 0.00    Average packs/day: 1 pack/day for 30.0 years (30.0 ttl pk-yrs)    Types: Cigarettes    Start date: 05/03/1974    Quit date: 05/03/2004    Years since quitting: 20.1   Smokeless tobacco: Never  Vaping Use   Vaping status: Never Used  Substance and Sexual Activity   Alcohol  use: No    Alcohol /week: 0.0 standard drinks of alcohol    Drug use: No   Sexual activity: Not Currently    Birth control/protection: Surgical    Comment: hyst

## 2024-06-26 NOTE — Procedures (Signed)
 EMG & NCV Findings: Evaluation of the right median motor nerve showed prolonged distal onset latency (4.4 ms) and decreased conduction velocity (Elbow-Wrist, 48 m/s).  The right median (across palm) sensory nerve showed prolonged distal peak latency (Wrist, 4.5 ms) and prolonged distal peak latency (Palm, 2.3 ms).  The right ulnar sensory nerve showed prolonged distal peak latency (3.8 ms), reduced amplitude (13.2 V), and decreased conduction velocity (Wrist-5th Digit, 37 m/s).  All remaining nerves (as indicated in the following tables) were within normal limits.    Needle evaluation of the right abductor pollicis brevis muscle showed diminished recruitment.  All remaining muscles (as indicated in the following table) showed no evidence of electrical instability.    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 There is no significant electrodiagnostic evidence of any other focal nerve entrapment, brachial plexopathy or cervical radiculopathy.   Recommendations: 1.  Follow-up with referring physician. 2.  Continue current management of symptoms. 3.  Continue use of resting splint at night-time and as needed during the day. 4.  Suggest surgical evaluation.  ___________________________ Anne Terrell FAAPMR Board Certified, American Board of Physical Medicine and Rehabilitation    Nerve Conduction Studies Anti Sensory Summary Table   Stim Site NR Peak (ms) Norm Peak (ms) P-T Amp (V) Norm P-T Amp Site1 Site2 Delta-P (ms) Dist (cm) Vel (m/s) Norm Vel (m/s)  Right Median Acr Palm Anti Sensory (2nd Digit)  30.9C  Wrist    *4.5 <3.6 16.8 >10 Wrist Palm 2.2 0.0    Palm    *2.3 <2.0 15.9         Right Radial Anti Sensory (Base 1st Digit)  30.9C  Wrist    2.1 <3.1 20.2  Wrist Base 1st Digit 2.1 0.0    Right Ulnar Anti Sensory (5th  Digit)  31.4C  Wrist    *3.8 <3.7 *13.2 >15.0 Wrist 5th Digit 3.8 14.0 *37 >38   Motor Summary Table   Stim Site NR Onset (ms) Norm Onset (ms) O-P Amp (mV) Norm O-P Amp Site1 Site2 Delta-0 (ms) Dist (cm) Vel (m/s) Norm Vel (m/s)  Right Median Motor (Abd Poll Brev)  31.2C  Wrist    *4.4 <4.2 5.7 >5 Elbow Wrist 4.0 19.0 *48 >50  Elbow    8.4  5.0         Right Ulnar Motor (Abd Dig Min)  31.4C  Wrist    2.8 <4.2 8.2 >3 B Elbow Wrist 3.3 18.0 55 >53  B Elbow    6.1  9.2  A Elbow B Elbow 1.3 10.0 77 >53  A Elbow    7.4  7.8          EMG   Side Muscle Nerve Root Ins Act Fibs Psw Amp Dur Poly Recrt Int Bruna Comment  Right Abd Poll Brev Median C8-T1 Nml Nml Nml Nml Nml 0 *Reduced Nml   Right 1stDorInt Ulnar C8-T1 Nml Nml Nml Nml Nml 0 Nml Nml   Right PronatorTeres Median C6-7 Nml Nml Nml Nml Nml 0 Nml Nml   Right Biceps Musculocut C5-6 Nml Nml Nml Nml Nml 0 Nml Nml   Right Deltoid Axillary C5-6 Nml Nml Nml Nml Nml 0 Nml Nml     Nerve Conduction Studies Anti Sensory Left/Right Comparison   Stim Site L Lat (ms) R Lat (ms) L-R Lat (ms) L Amp (V) R Amp (  V) L-R Amp (%) Site1 Site2 L Vel (m/s) R Vel (m/s) L-R Vel (m/s)  Median Acr Palm Anti Sensory (2nd Digit)  30.9C  Wrist  *4.5   16.8  Wrist Palm     Palm  *2.3   15.9        Radial Anti Sensory (Base 1st Digit)  30.9C  Wrist  2.1   20.2  Wrist Base 1st Digit     Ulnar Anti Sensory (5th Digit)  31.4C  Wrist  *3.8   *13.2  Wrist 5th Digit  *37    Motor Left/Right Comparison   Stim Site L Lat (ms) R Lat (ms) L-R Lat (ms) L Amp (mV) R Amp (mV) L-R Amp (%) Site1 Site2 L Vel (m/s) R Vel (m/s) L-R Vel (m/s)  Median Motor (Abd Poll Brev)  31.2C  Wrist  *4.4   5.7  Elbow Wrist  *48   Elbow  8.4   5.0        Ulnar Motor (Abd Dig Min)  31.4C  Wrist  2.8   8.2  B Elbow Wrist  55   B Elbow  6.1   9.2  A Elbow B Elbow  77   A Elbow  7.4   7.8           Waveforms:

## 2024-07-02 ENCOUNTER — Encounter: Payer: Self-pay | Admitting: Physical Medicine and Rehabilitation

## 2024-07-09 ENCOUNTER — Encounter: Payer: Self-pay | Admitting: Radiology

## 2024-07-12 ENCOUNTER — Ambulatory Visit: Admitting: Orthopedic Surgery

## 2024-07-12 NOTE — Progress Notes (Deleted)
 Anne Terrell - 83 y.o. female MRN [id number]  Date of birth: 05-01-1941  Office Visit Note: Visit Date: 07/12/2024 PCP: Dorcus Lamar POUR, MD Referred by: Dorcus Lamar POUR, MD  Subjective: No chief complaint on file.  HPI: Anne Terrell is a pleasant 83 y.o. female who returns today for evaluation of right hand and forearm pain with associated numbness and tingling, she has also developed a palmar cord status post a recent trigger digit surgery in line with the ring finger.  Surgery was performed in July of this year.  She does have a history of prior right open carpal tunnel release done over 20 to 30 years ago, feels that she has developed numbness and tingling in the radial aspect of the hand that has been progressive over the past multiple months as well.  Has undergone recent EMG per our request which does show moderate to severe carpal tunnel syndrome on the right side.   Pertinent ROS were reviewed with the patient and found to be negative unless otherwise specified above in HPI.   Assessment & Plan: Visit Diagnoses:  1. Carpal tunnel syndrome, right upper limb      Plan: Based on her clinical examination today, I do see evidence of reactive Dupuytren's palmar cord formation in the ring finger with minimal contracture.  She is also demonstrating signs and symptoms consistent with recurrent carpal tunnel in the right hand.  She does have both clinical and electrodiagnostic evidence to confirm this diagnosis.    Extensive discussion was had with the patient today about her ongoing right sided recurrent carpal tunnel syndrome that is refractory to conservative care.  Patient has both clinical and electrodiagnostic evidence to confirm this diagnosis.  At this juncture, she is indicated for right open revision carpal tunnel release with possible hypothenar fat flap.    Risks include but not limited to infection, bleeding, scarring, stiffness, nerve injury or vascular, tendon injury,  persistent symptoms, risk of recurrence and need for subsequent operation were all discussed in detail.  Patient consented understanding the above.  Will move forward surgical scheduling.***  Follow-up: No follow-ups on file.   Meds & Orders: No orders of the defined types were placed in this encounter.   No orders of the defined types were placed in this encounter.    Procedures: No procedures performed      Clinical History: ADDENDUM REPORT: 06/06/2020 12:25   ADDENDUM: Lumbar spine report was linked to the same report.   Segmentation: 5 non rib-bearing lumbar type vertebral bodies are present. The lowest fully formed vertebral body is L5.   Alignment: Slight anterolisthesis present at L3-4 and L4-5.   Vertebral bodies: Superior endplate Schmorl's node is present at L3. Schmorl's node at T11-12 has associated chronic fatty endplate marrow changes. Mild chronic fatty endplate marrow changes are present at L5-S1.   Conus medullaris and distal thoracic spinal cord are within normal limits. The tip is at L2.   Limited imaging the abdomen is unremarkable. There is no significant adenopathy. No solid organ lesions are present.   Disc levels: L1-2: Negative.   L2-3: Mild disc bulging is present. Facet hypertrophy is present on the right. No significant stenosis is present.   L3-4: A mild broad-based disc protrusion is present. Facet hypertrophy is worse on the right. Mild foraminal narrowing is present bilaterally. The central canal is patent.   L4-5: Uncovering of a broad-based disc protrusion is present. Moderate facet hypertrophy is noted bilaterally. Moderate right and mild  left foraminal stenosis is present.   L5-S1: Chronic thinning of the disc space is present. A mild broad-based protrusion is present. Mild bilateral foraminal stenosis is present.   Impressions:   1. Mild foraminal narrowing bilaterally at L3-4 secondary to disc bulging and facet  hypertrophy. 2. Moderate right and mild left foraminal stenosis at L4-5 due to a broad-based disc protrusion and asymmetric right-sided facet hypertrophy. 3. Chronic thinning of the disc with mild bilateral foraminal narrowing at L5-S1. 4. Slight anterolisthesis at L3-4 and L4-5.     Electronically Signed   By: Lonni Necessary M.D.   On: 06/06/2020 12:25  She reports that she quit smoking about 20 years ago. Her smoking use included cigarettes. She started smoking about 50 years ago. She has a 30 pack-year smoking history. She has never used smokeless tobacco. No results for input(s): HGBA1C, LABURIC in the last 8760 hours.  Objective:   Vital Signs: There were no vitals taken for this visit.  Physical Exam  Gen: Well-appearing, in no acute distress; non-toxic CV: Regular Rate. Well-perfused. Warm.  Resp: Breathing unlabored on room air; no wheezing. Psych: Fluid speech in conversation; appropriate affect; normal thought process  Ortho Exam Right hand: - Well-healed palmar incision line with the ring finger at the A1 pulley level, evidence of cordlike structure in line with the ring finger, mild contracture to the MP approximately 5 to 10 degrees, passively correctable - Positive Tinel's over the carpal tunnel region, previously well-healed incision, numbness in the median nerve distribution, hand remains warm and well-perfused, positive Phalen's, positive Durkan's compression  Imaging: No results found.  Past Medical/Family/Surgical/Social History: Medications & Allergies reviewed per EMR, new medications updated. Patient Active Problem List   Diagnosis Date Noted   Major depressive disorder, single episode, unspecified 06/22/2024   Chronic pain syndrome 06/22/2024   Hip pain 06/22/2024   Right sided abdominal pain 06/22/2024   DDD (degenerative disc disease), cervical 06/22/2024   Trigger finger, right ring finger 03/07/2024   Ischemic colitis 01/14/2024    Gastrointestinal hemorrhage 01/12/2024   Nausea vomiting and diarrhea 01/12/2024   Colitis 01/09/2024   Stenosing tenosynovitis of finger of left hand 05/25/2023   Essential hypertension 04/22/2022   Chronic kidney disease, stage 3a (HCC) 02/18/2022   Opioid dependence (HCC) 12/22/2021   Polyneuropathy associated with underlying disease 12/22/2021   Exertional dyspnea 08/15/2019   Screening cholesterol level 08/15/2019   Precordial pain 08/14/2019   Complications due to nervous system device, implant, and graft 07/11/2019   Neuropathy 04/02/2019   SIRS (systemic inflammatory response syndrome) (HCC) 03/23/2018   Acute blood loss anemia 03/23/2018   Status post right hip replacement 03/20/2018   Primary osteoarthritis of right hip    Atypical neuralgia 08/08/2017   Varicose veins of bilateral lower extremities with other complications 05/03/2014   Major depressive disorder, recurrent episode 04/11/2013   Major depressive disorder, single episode, moderate (HCC) 01/05/2013   Severe episode of recurrent major depressive disorder (HCC) 11/15/2012   Past Medical History:  Diagnosis Date   Arthralgia    Carpal tunnel syndrome    DDD (degenerative disc disease), cervical    DDD (degenerative disc disease), lumbar    Full dentures    History of hiatal hernia    Hypertension    Myalgia    Myofascial pain    Narcotic dependence (HCC)    Osteoarthritis    Primary localized osteoarthritis of right hip    Right rotator cuff tear    Spondylosis  Varicose veins    Wears glasses    Family History  Problem Relation Age of Onset   Diabetes Mother    Varicose Veins Mother    Asthma Mother    Heart defect Sister    Past Surgical History:  Procedure Laterality Date   ABDOMINAL HYSTERECTOMY  1988   APPENDECTOMY     BIOPSY OF SKIN SUBCUTANEOUS TISSUE AND/OR MUCOUS MEMBRANE  01/13/2024   Procedure: BIOPSY, SKIN, SUBCUTANEOUS TISSUE, OR MUCOUS MEMBRANE;  Surgeon: Rosalie Kitchens, MD;   Location: [facility] ENDOSCOPY;  Service: Gastroenterology;;   ORIN MEDIATE RELEASE Bilateral    COLONOSCOPY N/A 01/13/2024   Procedure: COLONOSCOPY;  Surgeon: Rosalie Kitchens, MD;  Location: [facility] ENDOSCOPY;  Service: Gastroenterology;  Laterality: N/A;   COLONOSCOPY WITH ESOPHAGOGASTRODUODENOSCOPY (EGD)     dental posts     holds bottom dentures in place   DILATION AND CURETTAGE OF UTERUS     FRACTURE SURGERY Left    LLE   MULTIPLE TOOTH EXTRACTIONS     SPINAL CORD STIMULATOR INSERTION     located near the right hip and waist area   SPINAL CORD STIMULATOR REMOVAL N/A 09/21/2019   Procedure: LUMBAR SPINAL CORD STIMULATOR REMOVAL;  Surgeon: Mindi Mt, MD;  Location: [facility] OR;  Service: Neurosurgery;  Laterality: N/A;  LUMBAR SPINAL CORD STIMULATOR REMOVAL   TENOLYSIS Right 03/07/2024   Procedure: right ring finger trigger finger release;  Surgeon: Jerri Kay HERO, MD;  Location: [facility];  Service: Orthopedics;  Laterality: Right;  right ring finger trigger finger release   TONSILLECTOMY     TOTAL HIP ARTHROPLASTY Right 03/20/2018   Procedure: RIGHT TOTAL HIP ARTHROPLASTY ANTERIOR APPROACH;  Surgeon: Jerri Kay HERO, MD;  Location: [facility] ORS;  Service: Orthopedics;  Laterality: Right;   TRIGGER FINGER RELEASE Left 05/25/2023   Procedure: LEFT MIDDLE TRIGGER FINGER RELEASE;  Surgeon: Jerri Kay HERO, MD;  Location: [facility];  Service: Orthopedics;  Laterality: Left;   Social History   Occupational History   Not on file  Tobacco Use   Smoking status: Former    Current packs/day: 0.00    Average packs/day: 1 pack/day for 30.0 years (30.0 ttl pk-yrs)    Types: Cigarettes    Start date: 05/03/1974    Quit date: 05/03/2004    Years since quitting: 20.2   Smokeless tobacco: Never  Vaping Use   Vaping status: Never Used  Substance and Sexual Activity   Alcohol  use: No    Alcohol /week: 0.0 standard drinks of alcohol    Drug use: No   Sexual activity: Not Currently    Birth  control/protection: Surgical    Comment: hyst    Antanisha Mohs Estela) Arlinda, M.D. [facility], Hand Surgery

## 2024-07-29 NOTE — Progress Notes (Unsigned)
EMG follow up.

## 2024-07-29 NOTE — Progress Notes (Unsigned)
 Anne Terrell - 83 y.o. female MRN [id number]  Date of birth: Dec 26, 1940  Office Visit Note: Visit Date: 07/30/2024 PCP: Dorcus Lamar POUR, MD Referred by: Dorcus Lamar POUR, MD  Subjective: No chief complaint on file.  HPI: Anne Terrell is a pleasant 83 y.o. female who returns today for evaluation of right hand and forearm pain with associated numbness and tingling, she has also developed a palmar cord status post a recent trigger digit surgery in line with the ring finger.  Surgery was performed in July of this year.  She does have a history of prior right open carpal tunnel release done over 20 to 30 years ago, feels that she has developed numbness and tingling in the radial aspect of the hand that has been progressive over the past multiple months as well.  Has undergone recent EMG per our request which does show moderate to severe carpal tunnel syndrome on the right side.   Pertinent ROS were reviewed with the patient and found to be negative unless otherwise specified above in HPI.   Assessment & Plan: Visit Diagnoses:  No diagnosis found.    Plan: Based on her clinical examination today, I do see evidence of reactive Dupuytren's palmar cord formation in the ring finger with minimal contracture.  She is also demonstrating signs and symptoms consistent with recurrent carpal tunnel in the right hand.  She does have both clinical and electrodiagnostic evidence to confirm this diagnosis.    Extensive discussion was had with the patient today about her ongoing right sided recurrent carpal tunnel syndrome that is refractory to conservative care.  Patient has both clinical and electrodiagnostic evidence to confirm this diagnosis.  At this juncture, she is indicated for right open revision carpal tunnel release with possible hypothenar fat flap.    Risks include but not limited to infection, bleeding, scarring, stiffness, nerve injury or vascular, tendon injury, persistent symptoms, risk of  recurrence and need for subsequent operation were all discussed in detail.  Patient consented understanding the above.  Will move forward surgical scheduling.***  Follow-up: No follow-ups on file.   Meds & Orders: No orders of the defined types were placed in this encounter.   No orders of the defined types were placed in this encounter.    Procedures: No procedures performed      Clinical History: ADDENDUM REPORT: 06/06/2020 12:25   ADDENDUM: Lumbar spine report was linked to the same report.   Segmentation: 5 non rib-bearing lumbar type vertebral bodies are present. The lowest fully formed vertebral body is L5.   Alignment: Slight anterolisthesis present at L3-4 and L4-5.   Vertebral bodies: Superior endplate Schmorl's node is present at L3. Schmorl's node at T11-12 has associated chronic fatty endplate marrow changes. Mild chronic fatty endplate marrow changes are present at L5-S1.   Conus medullaris and distal thoracic spinal cord are within normal limits. The tip is at L2.   Limited imaging the abdomen is unremarkable. There is no significant adenopathy. No solid organ lesions are present.   Disc levels: L1-2: Negative.   L2-3: Mild disc bulging is present. Facet hypertrophy is present on the right. No significant stenosis is present.   L3-4: A mild broad-based disc protrusion is present. Facet hypertrophy is worse on the right. Mild foraminal narrowing is present bilaterally. The central canal is patent.   L4-5: Uncovering of a broad-based disc protrusion is present. Moderate facet hypertrophy is noted bilaterally. Moderate right and mild left foraminal stenosis is present.  L5-S1: Chronic thinning of the disc space is present. A mild broad-based protrusion is present. Mild bilateral foraminal stenosis is present.   Impressions:   1. Mild foraminal narrowing bilaterally at L3-4 secondary to disc bulging and facet hypertrophy. 2. Moderate right and mild  left foraminal stenosis at L4-5 due to a broad-based disc protrusion and asymmetric right-sided facet hypertrophy. 3. Chronic thinning of the disc with mild bilateral foraminal narrowing at L5-S1. 4. Slight anterolisthesis at L3-4 and L4-5.     Electronically Signed   By: Lonni Necessary M.D.   On: 06/06/2020 12:25  She reports that she quit smoking about 20 years ago. Her smoking use included cigarettes. She started smoking about 50 years ago. She has a 30 pack-year smoking history. She has never used smokeless tobacco. No results for input(s): HGBA1C, LABURIC in the last 8760 hours.  Objective:   Vital Signs: There were no vitals taken for this visit.  Physical Exam  Gen: Well-appearing, in no acute distress; non-toxic CV: Regular Rate. Well-perfused. Warm.  Resp: Breathing unlabored on room air; no wheezing. Psych: Fluid speech in conversation; appropriate affect; normal thought process  Ortho Exam Right hand: - Well-healed palmar incision line with the ring finger at the A1 pulley level, evidence of cordlike structure in line with the ring finger, mild contracture to the MP approximately 5 to 10 degrees, passively correctable - Positive Tinel's over the carpal tunnel region, previously well-healed incision, numbness in the median nerve distribution, hand remains warm and well-perfused, positive Phalen's, positive Durkan's compression  Imaging: No results found.  Past Medical/Family/Surgical/Social History: Medications & Allergies reviewed per EMR, new medications updated. Patient Active Problem List   Diagnosis Date Noted   Major depressive disorder, single episode, unspecified 06/22/2024   Chronic pain syndrome 06/22/2024   Hip pain 06/22/2024   Right sided abdominal pain 06/22/2024   DDD (degenerative disc disease), cervical 06/22/2024   Trigger finger, right ring finger 03/07/2024   Ischemic colitis 01/14/2024   Gastrointestinal hemorrhage 01/12/2024   Nausea  vomiting and diarrhea 01/12/2024   Colitis 01/09/2024   Stenosing tenosynovitis of finger of left hand 05/25/2023   Essential hypertension 04/22/2022   Chronic kidney disease, stage 3a (HCC) 02/18/2022   Opioid dependence (HCC) 12/22/2021   Polyneuropathy associated with underlying disease 12/22/2021   Exertional dyspnea 08/15/2019   Screening cholesterol level 08/15/2019   Precordial pain 08/14/2019   Complications due to nervous system device, implant, and graft 07/11/2019   Neuropathy 04/02/2019   SIRS (systemic inflammatory response syndrome) (HCC) 03/23/2018   Acute blood loss anemia 03/23/2018   Status post right hip replacement 03/20/2018   Primary osteoarthritis of right hip    Atypical neuralgia 08/08/2017   Varicose veins of bilateral lower extremities with other complications 05/03/2014   Major depressive disorder, recurrent episode 04/11/2013   Major depressive disorder, single episode, moderate (HCC) 01/05/2013   Severe episode of recurrent major depressive disorder (HCC) 11/15/2012   Past Medical History:  Diagnosis Date   Arthralgia    Carpal tunnel syndrome    DDD (degenerative disc disease), cervical    DDD (degenerative disc disease), lumbar    Full dentures    History of hiatal hernia    Hypertension    Myalgia    Myofascial pain    Narcotic dependence (HCC)    Osteoarthritis    Primary localized osteoarthritis of right hip    Right rotator cuff tear    Spondylosis    Varicose veins  Wears glasses    Family History  Problem Relation Age of Onset   Diabetes Mother    Varicose Veins Mother    Asthma Mother    Heart defect Sister    Past Surgical History:  Procedure Laterality Date   ABDOMINAL HYSTERECTOMY  1988   APPENDECTOMY     BIOPSY OF SKIN SUBCUTANEOUS TISSUE AND/OR MUCOUS MEMBRANE  01/13/2024   Procedure: BIOPSY, SKIN, SUBCUTANEOUS TISSUE, OR MUCOUS MEMBRANE;  Surgeon: Rosalie Kitchens, MD;  Location: [facility] ENDOSCOPY;  Service: Gastroenterology;;    ORIN MEDIATE RELEASE Bilateral    COLONOSCOPY N/A 01/13/2024   Procedure: COLONOSCOPY;  Surgeon: Rosalie Kitchens, MD;  Location: [facility] ENDOSCOPY;  Service: Gastroenterology;  Laterality: N/A;   COLONOSCOPY WITH ESOPHAGOGASTRODUODENOSCOPY (EGD)     dental posts     holds bottom dentures in place   DILATION AND CURETTAGE OF UTERUS     FRACTURE SURGERY Left    LLE   MULTIPLE TOOTH EXTRACTIONS     SPINAL CORD STIMULATOR INSERTION     located near the right hip and waist area   SPINAL CORD STIMULATOR REMOVAL N/A 09/21/2019   Procedure: LUMBAR SPINAL CORD STIMULATOR REMOVAL;  Surgeon: Mindi Mt, MD;  Location: [facility] OR;  Service: Neurosurgery;  Laterality: N/A;  LUMBAR SPINAL CORD STIMULATOR REMOVAL   TENOLYSIS Right 03/07/2024   Procedure: right ring finger trigger finger release;  Surgeon: Jerri Kay HERO, MD;  Location: [facility];  Service: Orthopedics;  Laterality: Right;  right ring finger trigger finger release   TONSILLECTOMY     TOTAL HIP ARTHROPLASTY Right 03/20/2018   Procedure: RIGHT TOTAL HIP ARTHROPLASTY ANTERIOR APPROACH;  Surgeon: Jerri Kay HERO, MD;  Location: [facility] ORS;  Service: Orthopedics;  Laterality: Right;   TRIGGER FINGER RELEASE Left 05/25/2023   Procedure: LEFT MIDDLE TRIGGER FINGER RELEASE;  Surgeon: Jerri Kay HERO, MD;  Location: [facility];  Service: Orthopedics;  Laterality: Left;   Social History   Occupational History   Not on file  Tobacco Use   Smoking status: Former    Current packs/day: 0.00    Average packs/day: 1 pack/day for 30.0 years (30.0 ttl pk-yrs)    Types: Cigarettes    Start date: 05/03/1974    Quit date: 05/03/2004    Years since quitting: 20.2   Smokeless tobacco: Never  Vaping Use   Vaping status: Never Used  Substance and Sexual Activity   Alcohol  use: No    Alcohol /week: 0.0 standard drinks of alcohol    Drug use: No   Sexual activity: Not Currently    Birth control/protection: Surgical    Comment: hyst    Melayah Skorupski  Estela) Arlinda, M.D.  [facility], Hand Surgery

## 2024-07-30 ENCOUNTER — Ambulatory Visit (INDEPENDENT_AMBULATORY_CARE_PROVIDER_SITE_OTHER): Admitting: Orthopedic Surgery

## 2024-07-30 DIAGNOSIS — G5601 Carpal tunnel syndrome, right upper limb: Secondary | ICD-10-CM | POA: Diagnosis not present

## 2024-08-13 ENCOUNTER — Ambulatory Visit: Admitting: Orthopedic Surgery

## 2024-08-14 ENCOUNTER — Other Ambulatory Visit: Payer: Self-pay

## 2024-08-14 DIAGNOSIS — G5601 Carpal tunnel syndrome, right upper limb: Secondary | ICD-10-CM

## 2024-08-15 ENCOUNTER — Telehealth: Payer: Self-pay | Admitting: Orthopedic Surgery

## 2024-08-15 NOTE — Telephone Encounter (Signed)
 Called and LVM for patient in regards of this message.

## 2024-08-15 NOTE — Telephone Encounter (Signed)
 Pt called and said she has the surgery coming up but her left hand is hurting more. She wants you to call her before she have te surgery. CB#[phone]

## 2024-08-16 ENCOUNTER — Telehealth: Payer: Self-pay | Admitting: Orthopedic Surgery

## 2024-08-16 NOTE — Telephone Encounter (Signed)
 Pt called and returning your call. CB#[phone]

## 2024-08-16 NOTE — Telephone Encounter (Signed)
 Spoke with patient. Got her scheduled for Monday to evaluate her left hand

## 2024-08-16 NOTE — Telephone Encounter (Signed)
 Pt called back and said just call her on the home line. CB#[phone]

## 2024-08-16 NOTE — Telephone Encounter (Signed)
 LVM for patient again regarding message.

## 2024-08-19 NOTE — H&P (View-Only) (Signed)
 Anne Terrell - 83 y.o. female MRN [id number]  Date of birth: Jun 09, 1941  Office Visit Note: Visit Date: 08/20/2024 PCP: Dorcus Lamar POUR, MD Referred by: Dorcus Lamar POUR, MD  Subjective: No chief complaint on file.  HPI: Anne Terrell is a pleasant 83 y.o. female with upcoming plan for right revision carpal tunnel release with possible hypothenar fat flap, returns today for evaluation of left hand issues.  Has undergone recent EMG per our request which does show moderate to severe carpal tunnel syndrome on the right side.  On the left side she is describing left ring triggering but does often require manual correction as well as some ulnar-sided wrist pain.  She has a history of prior left long finger trigger digit release done last year by Dr. Jerri.    Pertinent ROS were reviewed with the patient and found to be negative unless otherwise specified above in HPI.   Assessment & Plan: Visit Diagnoses:  1. Pain in left wrist   2. Trigger finger, left ring finger      Plan: Extensive discussion was had with the patient today regarding her left ring trigger digit.  We discussed the etiology and pathophysiology of stenosing tenosynovitis.  We discussed conservative versus surgical treatment modalities.  From a conservative standpoint, we discussed activity modification, splinting, therapy and injections.  From a surgical standpoint, we discussed the possibility for trigger digit release as well as all risk and benefits associated.  Given that she has not trialed conservative treatments, patient is appropriate candidate for cortisone injection to the left ring finger A1 pulley for symptom relief.  Risks and benefit of the cortisone injection were discussed in detail, patient agreed to proceed.  Injection was provided today without issue.  As for the left wrist ulnar-sided pain, this does raise concern for potential underlying TFCC pathology.  X-rays left wrist obtained today do not show any  significant abnormalities.  I did recommend a wrist widget for her as opposed to a wrist brace which was fitted as well to utilize as needed.   Follow-up: No follow-ups on file.   Meds & Orders: No orders of the defined types were placed in this encounter.   Orders Placed This Encounter  Procedures   XR Wrist Complete Left     Procedures: Hand/UE Inj: L ring A1 for trigger finger on 08/20/2024 9:33 AM Indications: pain Details: 25 G needle, volar approach Medications: 1 mL lidocaine  1 %; 6 mg betamethasone  acetate-betamethasone  sodium phosphate  6 (3-3) MG/ML Outcome: tolerated well, no immediate complications Consent was given by the patient. Patient was prepped and draped in the usual sterile fashion.          Clinical History: ADDENDUM REPORT: 06/06/2020 12:25   ADDENDUM: Lumbar spine report was linked to the same report.   Segmentation: 5 non rib-bearing lumbar type vertebral bodies are present. The lowest fully formed vertebral body is L5.   Alignment: Slight anterolisthesis present at L3-4 and L4-5.   Vertebral bodies: Superior endplate Schmorl's node is present at L3. Schmorl's node at T11-12 has associated chronic fatty endplate marrow changes. Mild chronic fatty endplate marrow changes are present at L5-S1.   Conus medullaris and distal thoracic spinal cord are within normal limits. The tip is at L2.   Limited imaging the abdomen is unremarkable. There is no significant adenopathy. No solid organ lesions are present.   Disc levels: L1-2: Negative.   L2-3: Mild disc bulging is present. Facet hypertrophy is present on the right.  No significant stenosis is present.   L3-4: A mild broad-based disc protrusion is present. Facet hypertrophy is worse on the right. Mild foraminal narrowing is present bilaterally. The central canal is patent.   L4-5: Uncovering of a broad-based disc protrusion is present. Moderate facet hypertrophy is noted bilaterally. Moderate  right and mild left foraminal stenosis is present.   L5-S1: Chronic thinning of the disc space is present. A mild broad-based protrusion is present. Mild bilateral foraminal stenosis is present.   Impressions:   1. Mild foraminal narrowing bilaterally at L3-4 secondary to disc bulging and facet hypertrophy. 2. Moderate right and mild left foraminal stenosis at L4-5 due to a broad-based disc protrusion and asymmetric right-sided facet hypertrophy. 3. Chronic thinning of the disc with mild bilateral foraminal narrowing at L5-S1. 4. Slight anterolisthesis at L3-4 and L4-5.     Electronically Signed   By: Lonni Necessary M.D.   On: 06/06/2020 12:25  She reports that she quit smoking about 20 years ago. Her smoking use included cigarettes. She started smoking about 50 years ago. She has a 30 pack-year smoking history. She has never used smokeless tobacco. No results for input(s): HGBA1C, LABURIC in the last 8760 hours.  Objective:   Vital Signs: There were no vitals taken for this visit.  Physical Exam  Gen: Well-appearing, in no acute distress; non-toxic CV: Regular Rate. Well-perfused. Warm.  Resp: Breathing unlabored on room air; no wheezing. Psych: Fluid speech in conversation; appropriate affect; normal thought process  Ortho Exam Right hand: - Well-healed palmar incision line with the ring finger at the A1 pulley level, evidence of cordlike structure in line with the ring finger, mild contracture to the MP approximately 5 to 10 degrees, passively correctable - Positive Tinel's over the carpal tunnel region, previously well-healed incision, numbness in the median nerve distribution, hand remains warm and well-perfused, positive Phalen's, positive Durkan's compression  Left hand: - Tenderness over the ring finger A1 pulley region, notable clicking with deep flexion of the digit - Well-healed incisional site at the A1 region of the long finger - Notable tenderness over  the ulnar aspect of the wrist, positive foveal tenderness, pain with ulnar deviation  Imaging: XR Wrist Complete Left Result Date: 08/20/2024 There is no evidence of fracture or dislocation.  Degenerative changes are seen at the thumb [facility] region with joint space narrowing and osteophyte formation.  There is no evidence of arthropathy or other focal bone abnormality. Soft tissues are unremarkable.  Ulnar neutral variance    Past Medical/Family/Surgical/Social History: Medications & Allergies reviewed per EMR, new medications updated. 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 05/25/2023   Essential hypertension 04/22/2022   Chronic kidney disease, stage 3a (HCC) 02/18/2022   Opioid dependence (HCC) 12/22/2021   Polyneuropathy associated with underlying disease 12/22/2021   Exertional dyspnea 08/15/2019   Screening cholesterol level 08/15/2019   Precordial pain 08/14/2019   Complications due to nervous system device, implant, and graft 07/11/2019   Neuropathy 04/02/2019   SIRS (systemic inflammatory response syndrome) (HCC) 03/23/2018   Acute blood  loss anemia 03/23/2018   Status post right hip replacement 03/20/2018   Primary osteoarthritis of right hip    Atypical neuralgia 08/08/2017   Varicose veins of bilateral lower extremities with other complications 05/03/2014   Major depressive disorder, recurrent episode 04/11/2013   Major depressive disorder, single episode, moderate (HCC) 01/05/2013   Severe episode of recurrent  major depressive disorder (HCC) 11/15/2012   Past Medical History:  Diagnosis Date   Arthralgia    Carpal tunnel syndrome    DDD (degenerative disc disease), cervical    DDD (degenerative disc disease), lumbar    Full dentures    History of hiatal hernia    Hypertension    Myalgia    Myofascial pain    Narcotic dependence (HCC)    Osteoarthritis    Primary localized osteoarthritis of right hip    Right rotator cuff tear    Spondylosis    Varicose veins    Wears glasses    Family History  Problem Relation Age of Onset   Diabetes Mother    Varicose Veins Mother    Asthma Mother    Heart defect Sister    Past Surgical History:  Procedure Laterality Date   ABDOMINAL HYSTERECTOMY  1988   APPENDECTOMY     BIOPSY OF SKIN SUBCUTANEOUS TISSUE AND/OR MUCOUS MEMBRANE  01/13/2024   Procedure: BIOPSY, SKIN, SUBCUTANEOUS TISSUE, OR MUCOUS MEMBRANE;  Surgeon: Rosalie Kitchens, MD;  Location: [facility] ENDOSCOPY;  Service: Gastroenterology;;   ORIN MEDIATE RELEASE Bilateral    COLONOSCOPY N/A 01/13/2024   Procedure: COLONOSCOPY;  Surgeon: Rosalie Kitchens, MD;  Location: [facility] ENDOSCOPY;  Service: Gastroenterology;  Laterality: N/A;   COLONOSCOPY WITH ESOPHAGOGASTRODUODENOSCOPY (EGD)     dental posts     holds bottom dentures in place   DILATION AND CURETTAGE OF UTERUS     FRACTURE SURGERY Left    LLE   MULTIPLE TOOTH EXTRACTIONS     SPINAL CORD STIMULATOR INSERTION     located near the right hip and waist area   SPINAL CORD STIMULATOR REMOVAL N/A 09/21/2019   Procedure: LUMBAR SPINAL CORD STIMULATOR REMOVAL;  Surgeon: Mindi Mt, MD;  Location: [facility] OR;  Service: Neurosurgery;  Laterality: N/A;  LUMBAR SPINAL CORD STIMULATOR REMOVAL   TENOLYSIS Right 03/07/2024   Procedure: right ring finger trigger finger release;  Surgeon: Jerri Kay HERO, MD;  Location: [facility];  Service: Orthopedics;  Laterality: Right;  right ring finger trigger finger release   TONSILLECTOMY     TOTAL HIP  ARTHROPLASTY Right 03/20/2018   Procedure: RIGHT TOTAL HIP ARTHROPLASTY ANTERIOR APPROACH;  Surgeon: Jerri Kay HERO, MD;  Location: [facility] ORS;  Service: Orthopedics;  Laterality: Right;   TRIGGER FINGER RELEASE Left 05/25/2023   Procedure: LEFT MIDDLE TRIGGER FINGER RELEASE;  Surgeon: Jerri Kay HERO, MD;  Location: [facility];  Service: Orthopedics;  Laterality: Left;   Social History   Occupational History   Not on file  Tobacco Use   Smoking status: Former    Current packs/day: 0.00    Average packs/day: 1 pack/day for 30.0 years (30.0 ttl pk-yrs)    Types: Cigarettes    Start date: 05/03/1974    Quit date: 05/03/2004    Years since quitting: 20.3   Smokeless tobacco: Never  Vaping Use   Vaping status: Never Used  Substance and Sexual Activity   Alcohol  use: No    Alcohol /week: 0.0 standard drinks of alcohol    Drug use: No   Sexual activity: Not  Currently    Birth control/protection: Surgical    Comment: hyst    Inocencio Roy Estela) Arlinda, M.D. [facility], Hand Surgery

## 2024-08-19 NOTE — Progress Notes (Unsigned)
 Anne Terrell - 83 y.o. female MRN [id number]  Date of birth: Jun 09, 1941  Office Visit Note: Visit Date: 08/20/2024 PCP: Dorcus Lamar POUR, MD Referred by: Dorcus Lamar POUR, MD  Subjective: No chief complaint on file.  HPI: Anne Terrell is a pleasant 83 y.o. female with upcoming plan for right revision carpal tunnel release with possible hypothenar fat flap, returns today for evaluation of left hand issues.  Has undergone recent EMG per our request which does show moderate to severe carpal tunnel syndrome on the right side.  On the left side she is describing left ring triggering but does often require manual correction as well as some ulnar-sided wrist pain.  She has a history of prior left long finger trigger digit release done last year by Dr. Jerri.    Pertinent ROS were reviewed with the patient and found to be negative unless otherwise specified above in HPI.   Assessment & Plan: Visit Diagnoses:  1. Pain in left wrist   2. Trigger finger, left ring finger      Plan: Extensive discussion was had with the patient today regarding her left ring trigger digit.  We discussed the etiology and pathophysiology of stenosing tenosynovitis.  We discussed conservative versus surgical treatment modalities.  From a conservative standpoint, we discussed activity modification, splinting, therapy and injections.  From a surgical standpoint, we discussed the possibility for trigger digit release as well as all risk and benefits associated.  Given that she has not trialed conservative treatments, patient is appropriate candidate for cortisone injection to the left ring finger A1 pulley for symptom relief.  Risks and benefit of the cortisone injection were discussed in detail, patient agreed to proceed.  Injection was provided today without issue.  As for the left wrist ulnar-sided pain, this does raise concern for potential underlying TFCC pathology.  X-rays left wrist obtained today do not show any  significant abnormalities.  I did recommend a wrist widget for her as opposed to a wrist brace which was fitted as well to utilize as needed.   Follow-up: No follow-ups on file.   Meds & Orders: No orders of the defined types were placed in this encounter.   Orders Placed This Encounter  Procedures   XR Wrist Complete Left     Procedures: Hand/UE Inj: L ring A1 for trigger finger on 08/20/2024 9:33 AM Indications: pain Details: 25 G needle, volar approach Medications: 1 mL lidocaine  1 %; 6 mg betamethasone  acetate-betamethasone  sodium phosphate  6 (3-3) MG/ML Outcome: tolerated well, no immediate complications Consent was given by the patient. Patient was prepped and draped in the usual sterile fashion.          Clinical History: ADDENDUM REPORT: 06/06/2020 12:25   ADDENDUM: Lumbar spine report was linked to the same report.   Segmentation: 5 non rib-bearing lumbar type vertebral bodies are present. The lowest fully formed vertebral body is L5.   Alignment: Slight anterolisthesis present at L3-4 and L4-5.   Vertebral bodies: Superior endplate Schmorl's node is present at L3. Schmorl's node at T11-12 has associated chronic fatty endplate marrow changes. Mild chronic fatty endplate marrow changes are present at L5-S1.   Conus medullaris and distal thoracic spinal cord are within normal limits. The tip is at L2.   Limited imaging the abdomen is unremarkable. There is no significant adenopathy. No solid organ lesions are present.   Disc levels: L1-2: Negative.   L2-3: Mild disc bulging is present. Facet hypertrophy is present on the right.  No significant stenosis is present.   L3-4: A mild broad-based disc protrusion is present. Facet hypertrophy is worse on the right. Mild foraminal narrowing is present bilaterally. The central canal is patent.   L4-5: Uncovering of a broad-based disc protrusion is present. Moderate facet hypertrophy is noted bilaterally. Moderate  right and mild left foraminal stenosis is present.   L5-S1: Chronic thinning of the disc space is present. A mild broad-based protrusion is present. Mild bilateral foraminal stenosis is present.   Impressions:   1. Mild foraminal narrowing bilaterally at L3-4 secondary to disc bulging and facet hypertrophy. 2. Moderate right and mild left foraminal stenosis at L4-5 due to a broad-based disc protrusion and asymmetric right-sided facet hypertrophy. 3. Chronic thinning of the disc with mild bilateral foraminal narrowing at L5-S1. 4. Slight anterolisthesis at L3-4 and L4-5.     Electronically Signed   By: Lonni Necessary M.D.   On: 06/06/2020 12:25  She reports that she quit smoking about 20 years ago. Her smoking use included cigarettes. She started smoking about 50 years ago. She has a 30 pack-year smoking history. She has never used smokeless tobacco. No results for input(s): HGBA1C, LABURIC in the last 8760 hours.  Objective:   Vital Signs: There were no vitals taken for this visit.  Physical Exam  Gen: Well-appearing, in no acute distress; non-toxic CV: Regular Rate. Well-perfused. Warm.  Resp: Breathing unlabored on room air; no wheezing. Psych: Fluid speech in conversation; appropriate affect; normal thought process  Ortho Exam Right hand: - Well-healed palmar incision line with the ring finger at the A1 pulley level, evidence of cordlike structure in line with the ring finger, mild contracture to the MP approximately 5 to 10 degrees, passively correctable - Positive Tinel's over the carpal tunnel region, previously well-healed incision, numbness in the median nerve distribution, hand remains warm and well-perfused, positive Phalen's, positive Durkan's compression  Left hand: - Tenderness over the ring finger A1 pulley region, notable clicking with deep flexion of the digit - Well-healed incisional site at the A1 region of the long finger - Notable tenderness over  the ulnar aspect of the wrist, positive foveal tenderness, pain with ulnar deviation  Imaging: XR Wrist Complete Left Result Date: 08/20/2024 There is no evidence of fracture or dislocation.  Degenerative changes are seen at the thumb [facility] region with joint space narrowing and osteophyte formation.  There is no evidence of arthropathy or other focal bone abnormality. Soft tissues are unremarkable.  Ulnar neutral variance    Past Medical/Family/Surgical/Social History: Medications & Allergies reviewed per EMR, new medications updated. 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 05/25/2023   Essential hypertension 04/22/2022   Chronic kidney disease, stage 3a (HCC) 02/18/2022   Opioid dependence (HCC) 12/22/2021   Polyneuropathy associated with underlying disease 12/22/2021   Exertional dyspnea 08/15/2019   Screening cholesterol level 08/15/2019   Precordial pain 08/14/2019   Complications due to nervous system device, implant, and graft 07/11/2019   Neuropathy 04/02/2019   SIRS (systemic inflammatory response syndrome) (HCC) 03/23/2018   Acute blood  loss anemia 03/23/2018   Status post right hip replacement 03/20/2018   Primary osteoarthritis of right hip    Atypical neuralgia 08/08/2017   Varicose veins of bilateral lower extremities with other complications 05/03/2014   Major depressive disorder, recurrent episode 04/11/2013   Major depressive disorder, single episode, moderate (HCC) 01/05/2013   Severe episode of recurrent  major depressive disorder (HCC) 11/15/2012   Past Medical History:  Diagnosis Date   Arthralgia    Carpal tunnel syndrome    DDD (degenerative disc disease), cervical    DDD (degenerative disc disease), lumbar    Full dentures    History of hiatal hernia    Hypertension    Myalgia    Myofascial pain    Narcotic dependence (HCC)    Osteoarthritis    Primary localized osteoarthritis of right hip    Right rotator cuff tear    Spondylosis    Varicose veins    Wears glasses    Family History  Problem Relation Age of Onset   Diabetes Mother    Varicose Veins Mother    Asthma Mother    Heart defect Sister    Past Surgical History:  Procedure Laterality Date   ABDOMINAL HYSTERECTOMY  1988   APPENDECTOMY     BIOPSY OF SKIN SUBCUTANEOUS TISSUE AND/OR MUCOUS MEMBRANE  01/13/2024   Procedure: BIOPSY, SKIN, SUBCUTANEOUS TISSUE, OR MUCOUS MEMBRANE;  Surgeon: Rosalie Kitchens, MD;  Location: [facility] ENDOSCOPY;  Service: Gastroenterology;;   ORIN MEDIATE RELEASE Bilateral    COLONOSCOPY N/A 01/13/2024   Procedure: COLONOSCOPY;  Surgeon: Rosalie Kitchens, MD;  Location: [facility] ENDOSCOPY;  Service: Gastroenterology;  Laterality: N/A;   COLONOSCOPY WITH ESOPHAGOGASTRODUODENOSCOPY (EGD)     dental posts     holds bottom dentures in place   DILATION AND CURETTAGE OF UTERUS     FRACTURE SURGERY Left    LLE   MULTIPLE TOOTH EXTRACTIONS     SPINAL CORD STIMULATOR INSERTION     located near the right hip and waist area   SPINAL CORD STIMULATOR REMOVAL N/A 09/21/2019   Procedure: LUMBAR SPINAL CORD STIMULATOR REMOVAL;  Surgeon: Mindi Mt, MD;  Location: [facility] OR;  Service: Neurosurgery;  Laterality: N/A;  LUMBAR SPINAL CORD STIMULATOR REMOVAL   TENOLYSIS Right 03/07/2024   Procedure: right ring finger trigger finger release;  Surgeon: Jerri Kay HERO, MD;  Location: [facility];  Service: Orthopedics;  Laterality: Right;  right ring finger trigger finger release   TONSILLECTOMY     TOTAL HIP  ARTHROPLASTY Right 03/20/2018   Procedure: RIGHT TOTAL HIP ARTHROPLASTY ANTERIOR APPROACH;  Surgeon: Jerri Kay HERO, MD;  Location: [facility];  Service: Orthopedics;  Laterality: Right;   TRIGGER FINGER RELEASE Left 05/25/2023   Procedure: LEFT MIDDLE TRIGGER FINGER RELEASE;  Surgeon: Jerri Kay HERO, MD;  Location: [facility];  Service: Orthopedics;  Laterality: Left;   Social History   Occupational History   Not on file  Tobacco Use   Smoking status: Former    Current packs/day: 0.00    Average packs/day: 1 pack/day for 30.0 years (30.0 ttl pk-yrs)    Types: Cigarettes    Start date: 05/03/1974    Quit date: 05/03/2004    Years since quitting: 20.3   Smokeless tobacco: Never  Vaping Use   Vaping status: Never Used  Substance and Sexual Activity   Alcohol  use: No    Alcohol /week: 0.0 standard drinks of alcohol    Drug use: No   Sexual activity: Not  Currently    Birth control/protection: Surgical    Comment: hyst    Inocencio Roy Estela) Arlinda, M.D. [facility], Hand Surgery

## 2024-08-20 ENCOUNTER — Ambulatory Visit: Admitting: Orthopedic Surgery

## 2024-08-20 ENCOUNTER — Other Ambulatory Visit: Payer: Self-pay

## 2024-08-20 DIAGNOSIS — M25532 Pain in left wrist: Secondary | ICD-10-CM

## 2024-08-20 DIAGNOSIS — M65342 Trigger finger, left ring finger: Secondary | ICD-10-CM

## 2024-08-20 MED ORDER — LIDOCAINE HCL 1 % IJ SOLN
1.0000 mL | INTRAMUSCULAR | Status: AC | PRN
  Administered 2024-08-20: 10:00:00 1 mL

## 2024-08-20 MED ORDER — BETAMETHASONE SOD PHOS & ACET 6 (3-3) MG/ML IJ SUSP
6.0000 mg | INTRAMUSCULAR | Status: AC | PRN
  Administered 2024-08-20: 10:00:00 6 mg via INTRA_ARTICULAR

## 2024-08-21 ENCOUNTER — Encounter (HOSPITAL_BASED_OUTPATIENT_CLINIC_OR_DEPARTMENT_OTHER): Payer: Self-pay | Admitting: Orthopedic Surgery

## 2024-08-21 ENCOUNTER — Other Ambulatory Visit: Payer: Self-pay

## 2024-08-28 ENCOUNTER — Ambulatory Visit (HOSPITAL_BASED_OUTPATIENT_CLINIC_OR_DEPARTMENT_OTHER)

## 2024-08-28 ENCOUNTER — Encounter (HOSPITAL_BASED_OUTPATIENT_CLINIC_OR_DEPARTMENT_OTHER): Admission: RE | Disposition: A | Payer: Self-pay | Attending: Orthopedic Surgery

## 2024-08-28 ENCOUNTER — Other Ambulatory Visit: Payer: Self-pay

## 2024-08-28 ENCOUNTER — Encounter (HOSPITAL_BASED_OUTPATIENT_CLINIC_OR_DEPARTMENT_OTHER): Payer: Self-pay | Admitting: Orthopedic Surgery

## 2024-08-28 ENCOUNTER — Ambulatory Visit (HOSPITAL_BASED_OUTPATIENT_CLINIC_OR_DEPARTMENT_OTHER)
Admission: RE | Admit: 2024-08-28 | Discharge: 2024-08-28 | Disposition: A | Discharge status: Home / Self Care | Attending: Orthopedic Surgery | Admitting: Orthopedic Surgery

## 2024-08-28 DIAGNOSIS — Z87891 Personal history of nicotine dependence: Secondary | ICD-10-CM

## 2024-08-28 DIAGNOSIS — I129 Hypertensive chronic kidney disease with stage 1 through stage 4 chronic kidney disease, or unspecified chronic kidney disease: Secondary | ICD-10-CM | POA: Diagnosis not present

## 2024-08-28 DIAGNOSIS — M1611 Unilateral primary osteoarthritis, right hip: Secondary | ICD-10-CM | POA: Insufficient documentation

## 2024-08-28 DIAGNOSIS — G5601 Carpal tunnel syndrome, right upper limb: Secondary | ICD-10-CM | POA: Diagnosis present

## 2024-08-28 DIAGNOSIS — N1831 Chronic kidney disease, stage 3a: Secondary | ICD-10-CM

## 2024-08-28 DIAGNOSIS — I10 Essential (primary) hypertension: Secondary | ICD-10-CM | POA: Insufficient documentation

## 2024-08-28 DIAGNOSIS — E669 Obesity, unspecified: Secondary | ICD-10-CM | POA: Diagnosis not present

## 2024-08-28 HISTORY — PX: CARPAL TUNNEL RELEASE: SHX101

## 2024-08-28 SURGERY — CARPAL TUNNEL RELEASE
Anesthesia: Monitor Anesthesia Care | Site: Wrist | Laterality: Right

## 2024-08-28 MED ORDER — DEXAMETHASONE SOD PHOSPHATE PF 10 MG/ML IJ SOLN
INTRAMUSCULAR | Status: DC | PRN
  Administered 2024-08-28: 4 mg via INTRAVENOUS

## 2024-08-28 MED ORDER — FENTANYL CITRATE (PF) 100 MCG/2ML IJ SOLN
INTRAMUSCULAR | Status: AC
  Filled 2024-08-28: qty 2

## 2024-08-28 MED ORDER — PROPOFOL 10 MG/ML IV BOLUS
INTRAVENOUS | Status: DC | PRN
  Administered 2024-08-28: 120 mg via INTRAVENOUS

## 2024-08-28 MED ORDER — OXYCODONE HCL 5 MG/5ML PO SOLN: 5.0000 mg | Freq: Once | ORAL | Status: DC | PRN

## 2024-08-28 MED ORDER — ONDANSETRON HCL 4 MG/2ML IJ SOLN
INTRAMUSCULAR | Status: AC
  Filled 2024-08-28: qty 2

## 2024-08-28 MED ORDER — DROPERIDOL 2.5 MG/ML IJ SOLN: 0.6250 mg | Freq: Once | INTRAMUSCULAR | Status: DC | PRN

## 2024-08-28 MED ORDER — LACTATED RINGERS IV SOLN: INTRAVENOUS | Status: DC

## 2024-08-28 MED ORDER — FENTANYL CITRATE (PF) 100 MCG/2ML IJ SOLN: 25.0000 ug | INTRAMUSCULAR | Status: DC | PRN

## 2024-08-28 MED ORDER — LIDOCAINE HCL (CARDIAC) PF 100 MG/5ML IV SOSY
PREFILLED_SYRINGE | INTRAVENOUS | Status: DC | PRN
  Administered 2024-08-28: 100 mg via INTRAVENOUS

## 2024-08-28 MED ORDER — OXYCODONE HCL 5 MG PO TABS: 5.0000 mg | ORAL_TABLET | Freq: Four times a day (QID) | ORAL | 0 refills | Status: AC | PRN

## 2024-08-28 MED ORDER — CEFAZOLIN SODIUM-DEXTROSE 2-4 GM/100ML-% IV SOLN
2.0000 g | INTRAVENOUS | Status: AC
  Administered 2024-08-28: 2 g via INTRAVENOUS

## 2024-08-28 MED ORDER — 0.9 % SODIUM CHLORIDE (POUR BTL) OPTIME
TOPICAL | Status: DC | PRN
  Administered 2024-08-28: 250 mL

## 2024-08-28 MED ORDER — ACETAMINOPHEN 10 MG/ML IV SOLN: 1000.0000 mg | Freq: Once | INTRAVENOUS | Status: DC | PRN

## 2024-08-28 MED ORDER — FENTANYL CITRATE (PF) 100 MCG/2ML IJ SOLN
INTRAMUSCULAR | Status: DC | PRN
  Administered 2024-08-28 (×2): 25 ug via INTRAVENOUS

## 2024-08-28 MED ORDER — OXYCODONE HCL 5 MG PO TABS: 5.0000 mg | ORAL_TABLET | Freq: Once | ORAL | Status: DC | PRN

## 2024-08-28 MED ORDER — ONDANSETRON HCL 4 MG/2ML IJ SOLN
INTRAMUSCULAR | Status: DC | PRN
  Administered 2024-08-28: 4 mg via INTRAVENOUS

## 2024-08-28 MED ORDER — PROPOFOL 10 MG/ML IV BOLUS
INTRAVENOUS | Status: AC
  Filled 2024-08-28: qty 20

## 2024-08-28 MED ORDER — LIDOCAINE-EPINEPHRINE (PF) 1 %-1:200000 IJ SOLN
INTRAMUSCULAR | Status: DC | PRN
  Administered 2024-08-28: 10 mL

## 2024-08-28 SURGICAL SUPPLY — 38 items
BLADE MINI RND TIP GREEN BEAV (BLADE) ×1 IMPLANT
BLADE SURG 15 STRL LF DISP TIS (BLADE) ×2 IMPLANT
BNDG COHESIVE 4X5 TAN STRL LF (GAUZE/BANDAGES/DRESSINGS) ×1 IMPLANT
BNDG COMPR ESMARK 4X3 LF (GAUZE/BANDAGES/DRESSINGS) ×1 IMPLANT
BNDG ELASTIC 4INX 5YD STR LF (GAUZE/BANDAGES/DRESSINGS) ×1 IMPLANT
BNDG GAUZE DERMACEA FLUFF 4 (GAUZE/BANDAGES/DRESSINGS) ×1 IMPLANT
CHLORAPREP W/TINT 26 (MISCELLANEOUS) ×1 IMPLANT
CORD BIPOLAR FORCEPS 12FT (ELECTRODE) ×1 IMPLANT
COVER BACK TABLE 60X90IN (DRAPES) ×1 IMPLANT
CUFF TOURN SGL QUICK 18X4 (TOURNIQUET CUFF) IMPLANT
DRAPE HAND 77X146 (DRAPES) ×1 IMPLANT
DRAPE SURG 17X23 STRL (DRAPES) ×1 IMPLANT
GAUZE PAD ABD 8X10 STRL (GAUZE/BANDAGES/DRESSINGS) ×1 IMPLANT
GAUZE SPONGE 4X4 12PLY STRL (GAUZE/BANDAGES/DRESSINGS) ×1 IMPLANT
GAUZE STRETCH 2X75IN STRL (MISCELLANEOUS) ×1 IMPLANT
GAUZE XEROFORM 1X8 LF (GAUZE/BANDAGES/DRESSINGS) ×1 IMPLANT
GLOVE BIO SURGEON STRL SZ7.5 (GLOVE) ×1 IMPLANT
GLOVE BIOGEL PI IND STRL 7.5 (GLOVE) ×1 IMPLANT
GOWN STRL REUS W/ TWL LRG LVL3 (GOWN DISPOSABLE) ×2 IMPLANT
GOWN STRL SURGICAL XL XLNG (GOWN DISPOSABLE) ×2 IMPLANT
NDL HYPO 25X5/8 SAFETYGLIDE (NEEDLE) IMPLANT
NEEDLE HYPO 25X5/8 SAFETYGLIDE (NEEDLE) IMPLANT
PACK BASIN DAY SURGERY FS (CUSTOM PROCEDURE TRAY) ×1 IMPLANT
PAD CAST 3X4 CTTN HI CHSV (CAST SUPPLIES) IMPLANT
SHEET MEDIUM DRAPE 40X70 STRL (DRAPES) ×1 IMPLANT
SLEEVE SCD COMPRESS KNEE MED (STOCKING) IMPLANT
SOLN 0.9% NACL POUR BTL 1000ML (IV SOLUTION) IMPLANT
SPIKE FLUID TRANSFER (MISCELLANEOUS) IMPLANT
SPLINT FIBERGLASS 4X30 (CAST SUPPLIES) IMPLANT
STOCKINETTE IMPERVIOUS 9X36 MD (GAUZE/BANDAGES/DRESSINGS) ×1 IMPLANT
SUCTION TUBE FRAZIER 10FR DISP (SUCTIONS) IMPLANT
SUT ETHILON 4 0 PS 2 18 (SUTURE) ×1 IMPLANT
SUT VIC AB 3-0 SH 27X BRD (SUTURE) IMPLANT
SYR BULB EAR ULCER 3OZ GRN STR (SYRINGE) ×2 IMPLANT
SYR CONTROL 10ML LL (SYRINGE) ×1 IMPLANT
TOWEL GREEN STERILE FF (TOWEL DISPOSABLE) ×2 IMPLANT
TUBE CONNECTING 20X1/4 (TUBING) IMPLANT
UNDERPAD 30X36 HEAVY ABSORB (UNDERPADS AND DIAPERS) ×1 IMPLANT

## 2024-08-28 NOTE — Anesthesia Postprocedure Evaluation (Signed)
"   Anesthesia Post Note  Patient: Anne Terrell  Procedure(s) Performed: Right wrist open carpal tunnel release revision with hypothenar fat flap (Right: Wrist)     Patient location during evaluation: PACU Anesthesia Type: General Level of consciousness: awake and alert Pain management: pain level controlled Vital Signs Assessment: post-procedure vital signs reviewed and stable Respiratory status: spontaneous breathing, nonlabored ventilation, respiratory function stable and patient connected to nasal cannula oxygen Cardiovascular status: blood pressure returned to baseline and stable Postop Assessment: no apparent nausea or vomiting Anesthetic complications: no   No notable events documented.  Last Vitals:  Vitals:   08/28/24 1228 08/28/24 1240  BP: (!) 145/62 (!) 160/74  Pulse: 85 78  Resp: 16 20  Temp:  36.6 C  SpO2: 92% 95%    Last Pain:  Vitals:   08/28/24 1240  TempSrc: Temporal  PainSc: 0-No pain                 Thom JONELLE Peoples      "

## 2024-08-28 NOTE — Anesthesia Procedure Notes (Signed)
 Procedure Name: LMA Insertion Date/Time: 08/28/2024 11:21 AM  Performed by: Julieanne Fairy BROCKS, CRNAPre-anesthesia Checklist: Patient identified, Emergency Drugs available, Suction available and Patient being monitored Patient Re-evaluated:Patient Re-evaluated prior to induction Oxygen Delivery Method: Circle system utilized Preoxygenation: Pre-oxygenation with 100% oxygen Induction Type: IV induction Ventilation: Mask ventilation without difficulty LMA: LMA inserted LMA Size: 4.0 Number of attempts: 1 Airway Equipment and Method: Bite block Placement Confirmation: positive ETCO2 Tube secured with: Tape Dental Injury: Teeth and Oropharynx as per pre-operative assessment

## 2024-08-28 NOTE — Anesthesia Preprocedure Evaluation (Addendum)
"                                    Anesthesia Evaluation  Patient identified by MRN, date of birth, ID band Patient awake    Reviewed: Allergy & Precautions, NPO status , Patient's Chart, lab work & pertinent test results  History of Anesthesia Complications Negative for: history of anesthetic complications  Airway Mallampati: II  TM Distance: >3 FB     Dental  (+) Upper Dentures, Lower Dentures   Pulmonary former smoker   Pulmonary exam normal breath sounds clear to auscultation       Cardiovascular hypertension, Pt. on medications (-) angina (-) Past MI Normal cardiovascular exam Rhythm:Regular Rate:Normal     Neuro/Psych neg Seizures PSYCHIATRIC DISORDERS  Depression    Chronic pain- takes MS Contin   Neuromuscular disease    GI/Hepatic hiatal hernia,,,(+)     substance abuse    Endo/Other  Obesity GLP-1 RA therapy- last dose  Renal/GU   negative genitourinary   Musculoskeletal  (+) Arthritis , Osteoarthritis,  narcotic dependentDDD lumbar and cervical spine Right sided carpal tunnel    Abdominal   Peds  Hematology  (+) Blood dyscrasia, anemia   Anesthesia Other Findings   Reproductive/Obstetrics                              Anesthesia Physical Anesthesia Plan  ASA: 3  Anesthesia Plan: MAC   Post-op Pain Management: Minimal or no pain anticipated   Induction: Intravenous  PONV Risk Score and Plan: 3 and Treatment may vary due to age or medical condition and Propofol  infusion  Airway Management Planned: Natural Airway and Simple Face Mask  Additional Equipment: None  Intra-op Plan:   Post-operative Plan:   Informed Consent: I have reviewed the patients History and Physical, chart, labs and discussed the procedure including the risks, benefits and alternatives for the proposed anesthesia with the patient or authorized representative who has indicated his/her understanding and acceptance.     Dental  advisory given  Plan Discussed with: CRNA and Anesthesiologist  Anesthesia Plan Comments:          Anesthesia Quick Evaluation  "

## 2024-08-28 NOTE — Transfer of Care (Signed)
 Immediate Anesthesia Transfer of Care Note  Patient: [name]  Procedure(s) Performed: Right wrist open carpal tunnel release revision with hypothenar fat flap (Right: Wrist)  Patient Location: PACU  Anesthesia Type:General  Level of Consciousness: sedated  Airway & Oxygen Therapy: Patient Spontanous Breathing and Patient connected to face mask oxygen  Post-op Assessment: Report given to RN and Post -op Vital signs reviewed and stable  Post vital signs: Reviewed and stable  Last Vitals:  Vitals Value Taken Time  BP 137/57 08/28/24 12:10  Temp 36.2 C 08/28/24 12:10  Pulse 73 08/28/24 12:13  Resp 8 08/28/24 12:13  SpO2 99 % 08/28/24 12:13  Vitals shown include unfiled device data.  Last Pain:  Vitals:   08/28/24 1210  TempSrc:   PainSc: Asleep      Patients Stated Pain Goal: 2 (08/28/24 0905)  Complications: No notable events documented.

## 2024-08-28 NOTE — Interval H&P Note (Signed)
 History and Physical Interval Note:  08/28/2024 10:11 AM  Anne Terrell  has presented today for surgery, with the diagnosis of RIGHT CARPAL TUNNEL SYNDROME.  The various methods of treatment have been discussed with the patient and family. After consideration of risks, benefits and other options for treatment, the patient has consented to  Procedures with comments: CARPAL TUNNEL RELEASE (Right) - RIGHT OPEN CARPAL TUNNEL RELEASE REVISION WITH HYPOTHENAR FAT FLAP as a surgical intervention.  The patient's history has been reviewed, patient examined, no change in status, stable for surgery.  I have reviewed the patient's chart and labs.  Questions were answered to the patient's satisfaction.     Nattaly Yebra

## 2024-08-28 NOTE — Discharge Instructions (Addendum)
   Hand Surgery Postop Instructions   Dressings: Maintain postoperative dressing until orthopedic follow-up.  Keep operative site clean and dry until orthopedic follow-up.  Wound Care: Keep your hand elevated above the level of your heart.  Do not allow it to dangle by your side. Moving your fingers is advised to stimulate circulation but will depend on the site of your surgery.  If you have a splint applied, your doctor will advise you regarding movement.  Activity: Do not drive or operate machinery until clearance given from physician. No heavy lifting with operative extremity.  Diet:  Drink liquids today or eat a light diet.  You may resume a regular diet tomorrow.    General expectations: Take prescribed medication if given, transition to over-the-counter medication as quickly as possible. Fingers may become slightly swollen.  Call your doctor if any of the following occur: Severe pain not relieved by pain medication. Elevated temperature. Dressing soaked with blood. Inability to move fingers. White or bluish color to fingers.   Per [facility] clinic policy, our goal is ensure optimal postoperative pain control with a multimodal pain management strategy. For all [facility] patients, our goal is to wean post-operative narcotic medications by 6 weeks post-operatively. If this is not possible due to utilization of pain medication prior to surgery, your [facility] doctor will support your acute post-operative pain control for the first 6 weeks postoperatively, with a plan to transition you back to your primary pain team following that. Cyndia Skeeters will work to ensure a Therapist, occupational.  Anshul Trevor Mace, M.D. Hand Surgery [facility]   Post Anesthesia Home Care Instructions  Activity: Get plenty of rest for the remainder of the day. A responsible individual must stay with you for 24 hours following the procedure.  For the next 24 hours, DO NOT: -Drive a  car -Advertising copywriter -Drink alcoholic beverages -Take any medication unless instructed by your physician -Make any legal decisions or sign important papers.  Meals: Start with liquid foods such as gelatin or soup. Progress to regular foods as tolerated. Avoid greasy, spicy, heavy foods. If nausea and/or vomiting occur, drink only clear liquids until the nausea and/or vomiting subsides. Call your physician if vomiting continues.  Special Instructions/Symptoms: Your throat may feel dry or sore from the anesthesia or the breathing tube placed in your throat during surgery. If this causes discomfort, gargle with warm salt water. The discomfort should disappear within 24 hours.  If you had a scopolamine patch placed behind your ear for the management of post- operative nausea and/or vomiting:  1. The medication in the patch is effective for 72 hours, after which it should be removed.  Wrap patch in a tissue and discard in the trash. Wash hands thoroughly with soap and water. 2. You may remove the patch earlier than 72 hours if you experience unpleasant side effects which may include dry mouth, dizziness or visual disturbances. 3. Avoid touching the patch. Wash your hands with soap and water after contact with the patch.

## 2024-08-28 NOTE — Op Note (Signed)
 NAME: Anne Terrell MEDICAL RECORD NO: [id number] DATE OF BIRTH: 10-10-1940 FACILITY: [facility] LOCATION: [facility] PHYSICIAN: GILDARDO ALDERTON, MD   OPERATIVE REPORT   DATE OF PROCEDURE: 08/29/2024    PREOPERATIVE DIAGNOSIS: Right recurrent carpal tunnel syndrome   POSTOPERATIVE DIAGNOSIS: Right recurrent carpal tunnel syndrome   PROCEDURE: Right revision open extended carpal tunnel release with hypothenar fat flap   SURGEON:  Gildardo Alderton, M.D.   ASSISTANT: Joesph Hooks, OPA   ANESTHESIA:  General   INTRAVENOUS FLUIDS:  Per anesthesia flow sheet.   ESTIMATED BLOOD LOSS:  Minimal.   COMPLICATIONS:  None.   SPECIMENS:  none   TOURNIQUET TIME:    Total Tourniquet Time Documented: Upper Arm (Right) - 8 minutes Total: Upper Arm (Right) - 8 minutes    DISPOSITION:  Stable to PACU.   INDICATIONS: 83 year old female with a history of prior right open carpal tunnel release roughly 20 years prior, presented with evidence of recurrent carpal tunnel syndrome refractory to conservative care.  Patient was indicated for right revision open carpal tunnel release with possible hypothenar fat flap.  Risks and benefits of surgery were discussed including the risks of infection, bleeding, scarring, stiffness, nerve injury, vascular injury, tendon injury, need for subsequent operation, persistent symptoms, recurrence.  She voiced understanding of these risks and elected to proceed.  OPERATIVE COURSE: Patient was seen and identified in the preoperative area and marked appropriately.  Surgical consent had been signed. Preoperative IV antibiotic prophylaxis was given. She was transferred to the operating room and placed in supine position with the Right upper extremity on an arm board.  General anesthesia was induced by the anesthesiologist.  Right upper extremity was prepped and draped in normal sterile orthopedic fashion.  A surgical pause was performed between the surgeons,  anesthesia, and operating room staff and all were in agreement as to the patient, procedure, and site of procedure.  Tourniquet was placed and padded appropriately to the right upper arm.   The arms exsanguinated and the tourniquet was inflated to 250 mmHg.  The previous carpal tunnel incision was identified and was reopened with careful dissection through the scar tissue.  15 blade was utilized for the skin surface, incision was extended across the wrist crease in [place] style fashion and into the volar forearm.  There was noted to be significant scarring and thickened tissue over the median nerve.  We first identified the nerve proximally, release was performed in a proximal to distal fashion to prevent injury distally to the median nerve.  Antebrachial fascia was decompressed to the level of the transverse carpal ligament.  Median nerve was protected and the transverse carpal ligament was released.  There was noted to be significant scarring around the nerve circumferentially, requiring neurolysis and release of scar adhesions.  We were able to identify the distal fat pad indicating appropriate distal decompression of the median nerve.   Once we were satisfied with our proximal and distal decompression of the median nerve, decision was made to proceed with the planned hypothenar fat pad flap.  A vascularized fat flap was elevated, ensuring adequate blood supply from the hypothenar region of the hand.  The flap was transposed and positioned over the median nerve to provide cushioning and prevent recurrent scarring.  The flap was secured in place utilizing 3-0 Vicryl suture in figure-of-eight fashion to the radial sided tissue.  Freer elevator was inserted underneath the fat flap as it was secured to ensure that the nerve was not overly  compressed.   At this juncture, the tourniquet was deflated and bipolar electrocautery was utilized for hemostasis.  The tourniquet was deflated at 8 minutes.  Fingertips  were pink with brisk capillary refill after deflation of tourniquet.  Copious irrigation was performed of the wound.  Closure was performed utilizing 4-0 nylon suture in horizontal mattress fashion.  10 cc of 1% lidocaine  with epinephrine  utilized for anesthetic purposes around the incisional site.  Sterile dressings were applied followed by application of a volar slab plaster wrist splint.  The operative drapes were broken down.  The patient was awoken from anesthesia safely and taken to PACU in stable condition.   Post-operative plan: The patient will recover in the post-anesthesia care unit and then be discharged home.  The patient will be non weight bearing on the right upper extremity in a short arm splint.   I will see the patient back in the office in 2 weeks for postoperative followup.    [name], MD Electronically signed, 08/29/2024

## 2024-08-29 ENCOUNTER — Encounter (HOSPITAL_BASED_OUTPATIENT_CLINIC_OR_DEPARTMENT_OTHER): Payer: Self-pay | Admitting: Orthopedic Surgery

## 2024-08-29 DIAGNOSIS — G5601 Carpal tunnel syndrome, right upper limb: Secondary | ICD-10-CM

## 2024-09-10 NOTE — Progress Notes (Signed)
" ° °  Rileigh Kawashima - 84 y.o. female MRN [id number]  Date of birth: 22-Sep-1940  Office Visit Note: Visit Date: 09/11/2024 PCP: Dorcus Lamar POUR, MD Referred by: Dorcus Lamar POUR, MD  Subjective:  HPI: Anne Terrell is a 84 y.o. female who presents today for follow up 2 weeks status post right wrist revision open extended carpal tunnel release with hypothenar fat flap. Doing well overall, pain is well controlled.  She did remove her splint 1 week postoperatively.  Pertinent ROS were reviewed with the patient and found to be negative unless otherwise specified above in HPI.   Assessment & Plan: Visit Diagnoses: No diagnosis found.  Plan: Sutures are removed today. She will be seen by occupational therapy for formalized therapy and transition to a HEP. She will follow up with me in approximately 4 weeks for clinical recheck.  Follow-up: No follow-ups on file.   Meds & Orders: No orders of the defined types were placed in this encounter.  No orders of the defined types were placed in this encounter.    Procedures: No procedures performed       Objective:   Vital Signs: There were no vitals taken for this visit.  Ortho Exam Right hand: - Well-healing palmar incision with extension to the forearm, sutures in place, skin edges well-approximated, slight erythema at the volar wrist level - Composite fist without restriction - Sensation intact to light touch median nerve distribution - 5/5 APB minimal thenar atrophy   Imaging: No results found.   Kalissa Grays Afton Alderton, M.D. [facility], Hand Surgery  "

## 2024-09-11 ENCOUNTER — Encounter: Admitting: Orthopedic Surgery

## 2024-09-11 DIAGNOSIS — Z9889 Other specified postprocedural states: Secondary | ICD-10-CM

## 2024-09-11 NOTE — Therapy (Signed)
 " OUTPATIENT OCCUPATIONAL THERAPY ORTHO EVALUATION AND DISCHARGE NOTE  Patient Name: Anne Terrell MRN: [id number] DOB:04-14-1941, 84 y.o., female Today's Date: 09/12/2024  REFERRING PROVIDER: Arlinda Buster, MD   END OF SESSION:   Past Medical History:  Diagnosis Date   Arthralgia    Carpal tunnel syndrome    DDD (degenerative disc disease), cervical    DDD (degenerative disc disease), lumbar    Full dentures    History of hiatal hernia    Hypertension    Myalgia    Myofascial pain    Narcotic dependence (HCC)    Osteoarthritis    Primary localized osteoarthritis of right hip    Right rotator cuff tear    Spondylosis    Varicose veins    Wears glasses    Past Surgical History:  Procedure Laterality Date   ABDOMINAL HYSTERECTOMY  1988   APPENDECTOMY     BIOPSY OF SKIN SUBCUTANEOUS TISSUE AND/OR MUCOUS MEMBRANE  01/13/2024   Procedure: BIOPSY, SKIN, SUBCUTANEOUS TISSUE, OR MUCOUS MEMBRANE;  Surgeon: Rosalie Kitchens, MD;  Location: [facility] ENDOSCOPY;  Service: Gastroenterology;;   CARPAL TUNNEL RELEASE Bilateral    CARPAL TUNNEL RELEASE Right 08/28/2024   Procedure: Right wrist open carpal tunnel release revision with hypothenar fat flap;  Surgeon: Arlinda Buster, MD;  Location: [facility];  Service: Orthopedics;  Laterality: Right;   COLONOSCOPY N/A 01/13/2024   Procedure: COLONOSCOPY;  Surgeon: Rosalie Kitchens, MD;  Location: [facility] ENDOSCOPY;  Service: Gastroenterology;  Laterality: N/A;   COLONOSCOPY WITH ESOPHAGOGASTRODUODENOSCOPY (EGD)     dental posts     holds bottom dentures in place   DILATION AND CURETTAGE OF UTERUS     FRACTURE SURGERY Left    LLE   MULTIPLE TOOTH EXTRACTIONS     SPINAL CORD STIMULATOR INSERTION     located near the right hip and waist area   SPINAL CORD STIMULATOR REMOVAL N/A 09/21/2019   Procedure: LUMBAR SPINAL CORD STIMULATOR REMOVAL;  Surgeon: Mindi Mt, MD;  Location: [facility] OR;  Service: Neurosurgery;  Laterality: N/A;  LUMBAR SPINAL  CORD STIMULATOR REMOVAL   TENOLYSIS Right 03/07/2024   Procedure: right ring finger trigger finger release;  Surgeon: Jerri Kay HERO, MD;  Location: [facility];  Service: Orthopedics;  Laterality: Right;  right ring finger trigger finger release   TONSILLECTOMY     TOTAL HIP ARTHROPLASTY Right 03/20/2018   Procedure: RIGHT TOTAL HIP ARTHROPLASTY ANTERIOR APPROACH;  Surgeon: Jerri Kay HERO, MD;  Location: [facility] ORS;  Service: Orthopedics;  Laterality: Right;   TRIGGER FINGER RELEASE Left 05/25/2023   Procedure: LEFT MIDDLE TRIGGER FINGER RELEASE;  Surgeon: Jerri Kay HERO, MD;  Location: [facility];  Service: Orthopedics;  Laterality: Left;   Patient Active Problem List   Diagnosis Date Noted   Carpal tunnel syndrome, right upper limb 08/29/2024   Major depressive disorder, single episode, unspecified 06/22/2024   Chronic pain syndrome 06/22/2024   Hip pain 06/22/2024   Right sided abdominal pain 06/22/2024   DDD (degenerative disc disease), cervical 06/22/2024   Trigger finger, right ring finger 03/07/2024   Ischemic colitis 01/14/2024   Gastrointestinal hemorrhage 01/12/2024   Nausea vomiting and diarrhea 01/12/2024   Colitis 01/09/2024   Stenosing tenosynovitis of finger of left hand 05/25/2023   Essential hypertension 04/22/2022   Chronic kidney disease, stage 3a (HCC) 02/18/2022   Opioid dependence (HCC) 12/22/2021   Polyneuropathy associated with underlying disease 12/22/2021   Exertional dyspnea 08/15/2019   Screening cholesterol level 08/15/2019  Precordial pain 08/14/2019   Complications due to nervous system device, implant, and graft 07/11/2019   Neuropathy 04/02/2019   SIRS (systemic inflammatory response syndrome) (HCC) 03/23/2018   Acute blood loss anemia 03/23/2018   Status post right hip replacement 03/20/2018   Primary osteoarthritis of right hip    Atypical neuralgia 08/08/2017   Varicose veins of bilateral lower extremities with other  complications 05/03/2014   Major depressive disorder, recurrent episode 04/11/2013   Major depressive disorder, single episode, moderate (HCC) 01/05/2013   Severe episode of recurrent major depressive disorder (HCC) 11/15/2012     ONSET DATE:  DOS 08/28/24  REFERRING DIAG: G56.01 (ICD-10-CM) - Carpal tunnel syndrome, right upper limb   THERAPY DIAG:     Other lack of coordination  Muscle weakness (generalized)  Pain in right wrist  Paresthesia of skin  Rationale for Evaluation and Treatment: Rehabilitation  SUBJECTIVE:   SUBJECTIVE STATEMENT: The patient states hx of paresthesia and pain in their hand and subsequent REVISION surgical release of the Rt carpal tunnel. Now the patient states having some lingering paresthesia, stiffness, pain, decreased ability to make a fist and perform I/ADLs.  She has Steri-Strips on and shows signs of some prior dehiscence which is likely due to the fact she states taking off her brace after surgery and crocheting for hours.  OT recommends against repetitive motion or prolonged activities for another 4 to 6 weeks.   PERTINENT HISTORY: The patient is now approx 2 weeks s/p Rt hand CTR.   PRECAUTIONS: None relative to this evaluation and episode of care.   RED FLAGS: None   WEIGHT BEARING RESTRICTIONS: Yes: caution with weightbearing for the next 4-6 weeks, recommended less than 5lbs for next 2 weeks with affected hand  PAIN:  Are you having pain? Yes: NPRS scale: mild now at rest  Pain location:  sx area Pain description: aching and sore Aggravating factors: gripping/squeezing Relieving factors: rest  FALLS: Has patient fallen in last 6 months? No, not a fall risk  PLOF: Independent with I/ADLs  PATIENT GOALS: To improve motion, function with affected surgical hand  NEXT MD VISIT: PRN    OBJECTIVE MEASURES:   ADLs: Overall ADLs: States decreased ability to grab, hold household objects, pain and difficulty to open containers,  perform FMS tasks (manipulate fasteners on clothing).    PSFS: 7 for daily activities    UPPER EXTREMITY ROM:     A/ROM Right eval  Wrist flexion 53  Wrist extension 39  (Blank rows = not tested)                    Hand A/ROM Right eval  Full Fist Ability (or Gap to Distal Palmar Crease) Near full fist   Thumb Opposition  (Kapandji Scale)  5/10  (Blank rows = not tested)   HAND STRENGTH & FUNCTION: Eval: Observed weakness in affected hand/arm, grossly 3-/5 MMT, but specific gripping and resistance training contraindicated today. Also at least mild observed coordination impairments with affected hand/arm due to stiffness and soreness. These deficits are expected to improve with HEP and recommendations.    COORDINATION: Eval: Mild observed coordination impairments with surgical hand, as seen by pain,stiffness, etc. Expected to improve with HEP and recommendations.   SENSATION: Eval:  Light touch mildly diminished especially through sx area. Expected to improve with HEP and recommendations.   EDEMA:   Eval:  Mildly swollen in surgical hand today.  Expected to improve with HEP and recommendations.   COGNITION:  Eval: Overall cognitive status: WFL for evaluation today   OBSERVATIONS:   Eval: Surgical site is clean and no overt signs of infection, no drainage, signs of dehiscence, etc.  Tenderness and swelling is within normal limits for post-op timeframe.     TODAY'S TREATMENT:  Post-evaluation treatment:   The patient was given safety information for managing post-op wound, including not to soak wound, to keep clean and dry, to start with gentle light touch for desensitization and eventually more aggressive scar mobilizations approx 5-7 days after stitches are removed and if the wound is closed. The patient should monitor for signs of infection.  The patient was supplied with compressive gauze to help with swelling as needed.  The patient should contact the surgeon with any  concerns immediately.   The patient should also avoid any strong gripping, push, pull, weight bearing or repetitive motion for the next 4-6 weeks.  The patient should not be doing painful activities.  The patient should not rest on their palm, keep the wrist bent for long time periods, or sleep on their hand. After a month, the patient can progressively return to all light, normal activities. Sports and heavy weight lifting should be withheld for a total of 3 months.   The patient was also educated (explanation and demonstration) on the following home exercise program including tolerable range of motion, gentle passive range of motion, scar care, progressive desensitization, prevention of soft tissue contractures, etc. The patient states understanding all directions and feels comfortable with doing this at home, self-management, and following up with the surgeon as needed/scheduled.    CTR Exercises  - Turn [employer] Facing Up & Down  - 4 x daily - 15 reps - Bend and Pull Back Wrist SLOWLY  - 4 x daily - 15 reps - Tendon Glides  - 4 x daily - 5 reps - 3 second hold - Median Nerve Flossing  - 3 x daily - 5 reps - Wrist Prayer Stretch  - 4 x daily - 3 reps - 15 second hold - Full finger stretches   - 4 x daily - 3 reps - 15 second hold Patient Education - Scar Massage   PATIENT EDUCATION: Education details: See tx section above for details  Person educated: Patient Education method: Verbal Instruction, Teach back, Handouts  Education comprehension: States and demonstrates understanding   HOME EXERCISE PROGRAM: See tx section above for details    GOALS: Goals reviewed with patient? Yes   SHORT TERM GOALS: (STG required if POC>30 days) Target Date: 09/12/24  1.  Pt will demo/state understanding of initial HEP and therapist recommendations to improve pain levels, improve motions and ability and eventually return to normal activities.   Goal status: MET    ASSESSMENT:  CLINICAL  IMPRESSION: Patient is a 84 y.o. female who was seen today for occupational therapy evaluation for swelling, pain, weakness and decreased functional ability following carpal tunnel release procedure. The patient is appropriate for OT rehab services and benefited from treatment today. The patient got copious education/treatment today for self-care, wound management, exercises and how to transition to normal activities in the next 4-6 weeks. The patient agrees that they can manage these recommendations independently, and should not need to return for follow up visits. The patient should follow up with the surgeon with any concerns, and could possibly return to therapy, if needed, with a new order.  The patient will discharge therapy treatment after this visit.     PERFORMANCE DEFICITS: in functional  skills including ADLs, IADLs, coordination, dexterity, sensation, edema, ROM, strength, pain, fascial restrictions, flexibility, Fine motor control, body mechanics, endurance, decreased knowledge of precautions, wound, and UE functional use, cognitive skills including problem solving and safety awareness, and psychosocial skills including coping strategies, environmental adaptation, and habits.   IMPAIRMENTS: are limiting patient from ADLs, IADLs, rest and sleep, leisure, and social participation.   COMORBIDITIES: may have co-morbidities  that affects occupational performance. Patient will benefit from skilled OT to address above impairments and improve overall function.  MODIFICATION OR ASSISTANCE TO COMPLETE EVALUATION: No modification of tasks or assist necessary to complete an evaluation.  OT OCCUPATIONAL PROFILE AND HISTORY: Problem focused assessment: Including review of records relating to presenting problem.  CLINICAL DECISION MAKING: LOW - limited treatment options, no task modification necessary  REHAB POTENTIAL: Excellent  EVALUATION COMPLEXITY: Low      PLAN:  OT FREQUENCY: one time  visit  OT DURATION: 1 sessions  PLANNED INTERVENTIONS: self care/ADL training, therapeutic exercise, therapeutic activity, neuromuscular re-education, manual therapy, scar mobilization, passive range of motion, splinting, ultrasound, fluidotherapy, compression bandaging, moist heat, cryotherapy, contrast bath, patient/family education, energy conservation, coping strategies training, and Re-evaluation  RECOMMENDED OTHER SERVICES: none now   CONSULTED AND AGREED WITH PLAN OF CARE: Patient  PLAN FOR NEXT SESSION:   N/A    Melvenia Ada, OTR/L, CHT 09/12/2024, 2:12 PM  "

## 2024-09-12 ENCOUNTER — Encounter: Payer: Self-pay | Admitting: Rehabilitative and Restorative Service Providers"

## 2024-09-12 ENCOUNTER — Ambulatory Visit: Admitting: Rehabilitative and Restorative Service Providers"

## 2024-09-12 DIAGNOSIS — M25531 Pain in right wrist: Secondary | ICD-10-CM

## 2024-09-12 DIAGNOSIS — M6281 Muscle weakness (generalized): Secondary | ICD-10-CM | POA: Diagnosis not present

## 2024-09-12 DIAGNOSIS — R202 Paresthesia of skin: Secondary | ICD-10-CM

## 2024-09-12 DIAGNOSIS — R278 Other lack of coordination: Secondary | ICD-10-CM

## 2024-10-09 ENCOUNTER — Ambulatory Visit: Admitting: Orthopedic Surgery

## 2024-10-09 DIAGNOSIS — Z9889 Other specified postprocedural states: Secondary | ICD-10-CM

## 2024-10-09 NOTE — Progress Notes (Signed)
" ° °  Anne Terrell - 84 y.o. female MRN [id number]  Date of birth: 10/23/1940  Office Visit Note: Visit Date: 10/09/2024 PCP: Dorcus Lamar POUR, MD Referred by: Dorcus Lamar POUR, MD  Subjective:  HPI: Anne Terrell is a 84 y.o. female who presents today for follow up 6 weeks status post right wrist revision open extended carpal tunnel release with hypothenar fat flap.  She is doing well overall, pain is controlled, numbness and tingling continues to improve.  Does have some mild triggering of the right long finger occasionally.  Pertinent ROS were reviewed with the patient and found to be negative unless otherwise specified above in HPI.   Assessment & Plan: Visit Diagnoses:  1. S/P carpal tunnel release     Plan: She is doing well postoperatively, is pleased with her outcome and progress.  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  As far as the right long finger triggering, there is no active triggering on examination today.  I did explain that should symptoms progress in the future we could consider potential cortisone injection versus trigger digit release.  She expressed full understanding.  Will return as needed moving forward.  Follow-up: No follow-ups on file.   Meds & Orders: No orders of the defined types were placed in this encounter.  No orders of the defined types were placed in this encounter.    Procedures: No procedures performed       Objective:   Vital Signs: There were no vitals taken for this visit.  Ortho Exam Right wrist with well-healed volar incision extending from the palmar aspect of the hand and forearm, skin edges well-approximated without erythema or drainage, composite fist without significant restriction, no active triggering examination today, sensation remains slightly diminished in the median nerve distribution  Imaging: No results  found.   Anne Terrell, M.D. [facility], Hand Surgery  "
